# Patient Record
Sex: Male | Born: 1970
Health system: Southern US, Community
[De-identification: ages and names within clinical notes are randomized; demographics above are authoritative.]

## PROBLEM LIST (undated history)

## (undated) DIAGNOSIS — E099 Drug or chemical induced diabetes mellitus without complications: Secondary | ICD-10-CM

## (undated) DIAGNOSIS — R0609 Other forms of dyspnea: Secondary | ICD-10-CM

## (undated) DIAGNOSIS — R0602 Shortness of breath: Secondary | ICD-10-CM

## (undated) DIAGNOSIS — E8881 Metabolic syndrome: Secondary | ICD-10-CM

## (undated) DIAGNOSIS — D649 Anemia, unspecified: Secondary | ICD-10-CM

## (undated) DIAGNOSIS — I10 Essential (primary) hypertension: Secondary | ICD-10-CM

## (undated) DIAGNOSIS — F4329 Adjustment disorder with other symptoms: Secondary | ICD-10-CM

## (undated) DIAGNOSIS — K219 Gastro-esophageal reflux disease without esophagitis: Secondary | ICD-10-CM

## (undated) DIAGNOSIS — G4730 Sleep apnea, unspecified: Secondary | ICD-10-CM

## (undated) DIAGNOSIS — U099 Post covid-19 condition, unspecified: Secondary | ICD-10-CM

## (undated) DIAGNOSIS — T7840XA Allergy, unspecified, initial encounter: Secondary | ICD-10-CM

## (undated) DIAGNOSIS — J309 Allergic rhinitis, unspecified: Secondary | ICD-10-CM

## (undated) DIAGNOSIS — D696 Thrombocytopenia, unspecified: Secondary | ICD-10-CM

## (undated) DIAGNOSIS — G4733 Obstructive sleep apnea (adult) (pediatric): Secondary | ICD-10-CM

## (undated) DIAGNOSIS — R0600 Dyspnea, unspecified: Secondary | ICD-10-CM

## (undated) DIAGNOSIS — N179 Acute kidney failure, unspecified: Secondary | ICD-10-CM

## (undated) DIAGNOSIS — E871 Hypo-osmolality and hyponatremia: Secondary | ICD-10-CM

## (undated) DIAGNOSIS — D693 Immune thrombocytopenic purpura: Secondary | ICD-10-CM

## (undated) HISTORY — DX: Dyspnea, unspecified: R06.00

## (undated) HISTORY — DX: Hypo-osmolality and hyponatremia: E87.1

## (undated) HISTORY — DX: Immune thrombocytopenic purpura: D69.3

## (undated) HISTORY — DX: Allergic rhinitis, unspecified: J30.9

## (undated) HISTORY — DX: Anemia, unspecified: D64.9

## (undated) HISTORY — DX: Shortness of breath: R06.02

## (undated) HISTORY — DX: Acute kidney failure, unspecified: N17.9

## (undated) HISTORY — DX: Adverse effect of glucocorticoids and synthetic analogues, initial encounter: E09.9

## (undated) HISTORY — DX: Post covid-19 condition, unspecified: U09.9

## (undated) HISTORY — DX: Adjustment disorder with other symptoms: F43.29

## (undated) HISTORY — DX: Allergy, unspecified, initial encounter: T78.40XA

## (undated) HISTORY — DX: Essential (primary) hypertension: I10

## (undated) HISTORY — DX: Other forms of dyspnea: R06.09

## (undated) HISTORY — DX: Obstructive sleep apnea (adult) (pediatric): G47.33

## (undated) HISTORY — DX: Gastro-esophageal reflux disease without esophagitis: K21.9

## (undated) HISTORY — DX: Morbid (severe) obesity due to excess calories: E66.01

## (undated) HISTORY — DX: Metabolic syndrome: E88.81

## (undated) HISTORY — DX: Metabolic syndrome: E88.810

## (undated) HISTORY — DX: Sleep apnea, unspecified: G47.30

## (undated) HISTORY — PX: NO PAST SURGERIES: SHX2092

## (undated) HISTORY — DX: Thrombocytopenia, unspecified: D69.6

---

## 2016-01-31 DIAGNOSIS — J309 Allergic rhinitis, unspecified: Secondary | ICD-10-CM | POA: Diagnosis not present

## 2016-01-31 DIAGNOSIS — G4733 Obstructive sleep apnea (adult) (pediatric): Secondary | ICD-10-CM | POA: Diagnosis not present

## 2016-01-31 DIAGNOSIS — I10 Essential (primary) hypertension: Secondary | ICD-10-CM | POA: Diagnosis not present

## 2016-01-31 DIAGNOSIS — K219 Gastro-esophageal reflux disease without esophagitis: Secondary | ICD-10-CM | POA: Diagnosis not present

## 2016-03-27 DIAGNOSIS — Z1389 Encounter for screening for other disorder: Secondary | ICD-10-CM | POA: Diagnosis not present

## 2016-03-27 DIAGNOSIS — Z0000 Encounter for general adult medical examination without abnormal findings: Secondary | ICD-10-CM | POA: Diagnosis not present

## 2016-10-01 DIAGNOSIS — G4733 Obstructive sleep apnea (adult) (pediatric): Secondary | ICD-10-CM | POA: Diagnosis not present

## 2016-10-01 DIAGNOSIS — J309 Allergic rhinitis, unspecified: Secondary | ICD-10-CM | POA: Diagnosis not present

## 2016-10-01 DIAGNOSIS — R0982 Postnasal drip: Secondary | ICD-10-CM | POA: Diagnosis not present

## 2016-10-01 DIAGNOSIS — I10 Essential (primary) hypertension: Secondary | ICD-10-CM | POA: Diagnosis not present

## 2016-11-12 DIAGNOSIS — G4733 Obstructive sleep apnea (adult) (pediatric): Secondary | ICD-10-CM | POA: Diagnosis not present

## 2016-12-09 DIAGNOSIS — G4733 Obstructive sleep apnea (adult) (pediatric): Secondary | ICD-10-CM | POA: Diagnosis not present

## 2017-03-09 DIAGNOSIS — Z6837 Body mass index (BMI) 37.0-37.9, adult: Secondary | ICD-10-CM | POA: Diagnosis not present

## 2017-03-09 DIAGNOSIS — R590 Localized enlarged lymph nodes: Secondary | ICD-10-CM | POA: Diagnosis not present

## 2017-03-09 DIAGNOSIS — H6980 Other specified disorders of Eustachian tube, unspecified ear: Secondary | ICD-10-CM | POA: Diagnosis not present

## 2017-03-27 DIAGNOSIS — Z0000 Encounter for general adult medical examination without abnormal findings: Secondary | ICD-10-CM | POA: Diagnosis not present

## 2017-03-27 DIAGNOSIS — I10 Essential (primary) hypertension: Secondary | ICD-10-CM | POA: Diagnosis not present

## 2017-03-27 DIAGNOSIS — Z6838 Body mass index (BMI) 38.0-38.9, adult: Secondary | ICD-10-CM | POA: Diagnosis not present

## 2017-09-29 DIAGNOSIS — G4733 Obstructive sleep apnea (adult) (pediatric): Secondary | ICD-10-CM | POA: Diagnosis not present

## 2017-09-29 DIAGNOSIS — I10 Essential (primary) hypertension: Secondary | ICD-10-CM | POA: Diagnosis not present

## 2017-09-29 DIAGNOSIS — E8881 Metabolic syndrome: Secondary | ICD-10-CM | POA: Diagnosis not present

## 2017-09-29 DIAGNOSIS — K219 Gastro-esophageal reflux disease without esophagitis: Secondary | ICD-10-CM | POA: Diagnosis not present

## 2018-05-31 DIAGNOSIS — Z760 Encounter for issue of repeat prescription: Secondary | ICD-10-CM | POA: Diagnosis not present

## 2018-05-31 DIAGNOSIS — I10 Essential (primary) hypertension: Secondary | ICD-10-CM | POA: Diagnosis not present

## 2018-05-31 DIAGNOSIS — Z1339 Encounter for screening examination for other mental health and behavioral disorders: Secondary | ICD-10-CM | POA: Diagnosis not present

## 2018-05-31 DIAGNOSIS — Z13220 Encounter for screening for lipoid disorders: Secondary | ICD-10-CM | POA: Diagnosis not present

## 2018-05-31 DIAGNOSIS — Z0000 Encounter for general adult medical examination without abnormal findings: Secondary | ICD-10-CM | POA: Diagnosis not present

## 2019-02-21 DIAGNOSIS — Z131 Encounter for screening for diabetes mellitus: Secondary | ICD-10-CM | POA: Diagnosis not present

## 2019-02-21 DIAGNOSIS — Z6841 Body Mass Index (BMI) 40.0 and over, adult: Secondary | ICD-10-CM | POA: Diagnosis not present

## 2019-02-21 DIAGNOSIS — Z0000 Encounter for general adult medical examination without abnormal findings: Secondary | ICD-10-CM | POA: Diagnosis not present

## 2019-02-21 DIAGNOSIS — Z13220 Encounter for screening for lipoid disorders: Secondary | ICD-10-CM | POA: Diagnosis not present

## 2019-03-08 DIAGNOSIS — D696 Thrombocytopenia, unspecified: Secondary | ICD-10-CM | POA: Diagnosis not present

## 2019-08-03 HISTORY — DX: Other disorders of bilirubin metabolism: E80.6

## 2020-01-19 DIAGNOSIS — D696 Thrombocytopenia, unspecified: Secondary | ICD-10-CM | POA: Diagnosis not present

## 2020-02-06 DIAGNOSIS — D696 Thrombocytopenia, unspecified: Secondary | ICD-10-CM | POA: Diagnosis not present

## 2020-02-15 DIAGNOSIS — D696 Thrombocytopenia, unspecified: Secondary | ICD-10-CM | POA: Diagnosis not present

## 2020-02-28 DIAGNOSIS — D696 Thrombocytopenia, unspecified: Secondary | ICD-10-CM | POA: Diagnosis not present

## 2020-03-14 DIAGNOSIS — D696 Thrombocytopenia, unspecified: Secondary | ICD-10-CM | POA: Diagnosis not present

## 2020-03-28 DIAGNOSIS — D696 Thrombocytopenia, unspecified: Secondary | ICD-10-CM

## 2020-04-11 DIAGNOSIS — D693 Immune thrombocytopenic purpura: Secondary | ICD-10-CM

## 2020-04-25 DIAGNOSIS — D696 Thrombocytopenia, unspecified: Secondary | ICD-10-CM

## 2020-05-09 DIAGNOSIS — D696 Thrombocytopenia, unspecified: Secondary | ICD-10-CM

## 2020-05-09 DIAGNOSIS — D693 Immune thrombocytopenic purpura: Secondary | ICD-10-CM

## 2020-06-06 ENCOUNTER — Other Ambulatory Visit: Payer: Self-pay | Admitting: Hematology and Oncology

## 2020-06-06 DIAGNOSIS — D693 Immune thrombocytopenic purpura: Secondary | ICD-10-CM | POA: Diagnosis not present

## 2020-06-06 LAB — BASIC METABOLIC PANEL
BUN: 23 — AB (ref 4–21)
CO2: 28 — AB (ref 13–22)
Chloride: 99 (ref 99–108)
Creatinine: 0.8 (ref 0.6–1.3)
Glucose: 126
Potassium: 3.5 (ref 3.4–5.3)
Sodium: 139 (ref 137–147)

## 2020-06-06 LAB — HEPATIC FUNCTION PANEL
ALT: 39 (ref 10–40)
AST: 26 (ref 14–40)
Alkaline Phosphatase: 56 (ref 25–125)
Bilirubin, Total: 1

## 2020-06-06 LAB — CBC: RBC: 4.78 (ref 3.87–5.11)

## 2020-06-06 LAB — CBC AND DIFFERENTIAL
HCT: 43 (ref 41–53)
Hemoglobin: 14.4 (ref 13.5–17.5)
Neutrophils Absolute: 10
Platelets: 84 — AB (ref 150–399)
WBC: 14.7

## 2020-06-06 LAB — COMPREHENSIVE METABOLIC PANEL
Albumin: 4.3 (ref 3.5–5.0)
Calcium: 9.6 (ref 8.7–10.7)

## 2020-06-20 ENCOUNTER — Encounter: Payer: Self-pay | Admitting: Pharmacist

## 2020-06-20 ENCOUNTER — Other Ambulatory Visit: Payer: Self-pay | Admitting: Oncology

## 2020-06-20 ENCOUNTER — Other Ambulatory Visit: Payer: Self-pay | Admitting: Hematology and Oncology

## 2020-06-20 DIAGNOSIS — D693 Immune thrombocytopenic purpura: Secondary | ICD-10-CM | POA: Insufficient documentation

## 2020-06-20 LAB — CBC AND DIFFERENTIAL
HCT: 47 (ref 41–53)
Hemoglobin: 15.8 (ref 13.5–17.5)
Neutrophils Absolute: 10.5
Platelets: 84 — AB (ref 150–399)
WBC: 14

## 2020-06-20 LAB — BASIC METABOLIC PANEL
BUN: 19 (ref 4–21)
CO2: 30 — AB (ref 13–22)
Chloride: 100 (ref 99–108)
Creatinine: 0.6 (ref 0.6–1.3)
Glucose: 107
Potassium: 3.7 (ref 3.4–5.3)
Sodium: 139 (ref 137–147)

## 2020-06-20 LAB — HEPATIC FUNCTION PANEL
ALT: 41 — AB (ref 10–40)
AST: 22 (ref 14–40)
Alkaline Phosphatase: 64 (ref 25–125)
Bilirubin, Total: 0.8

## 2020-06-20 LAB — CBC: RBC: 5.23 — AB (ref 3.87–5.11)

## 2020-06-20 LAB — COMPREHENSIVE METABOLIC PANEL
Albumin: 4.3 (ref 3.5–5.0)
Calcium: 9.2 (ref 8.7–10.7)

## 2020-06-28 ENCOUNTER — Other Ambulatory Visit: Payer: Self-pay | Admitting: Hematology and Oncology

## 2020-06-28 LAB — CBC AND DIFFERENTIAL
HCT: 29 — AB (ref 41–53)
Hemoglobin: 15.2 (ref 13.5–17.5)
Neutrophils Absolute: 9.79
Platelets: 95 — AB (ref 150–399)
WBC: 15.3

## 2020-06-28 LAB — CBC: RBC: 5.2 — AB (ref 3.87–5.11)

## 2020-06-29 ENCOUNTER — Inpatient Hospital Stay: Payer: 59 | Attending: Oncology

## 2020-06-29 ENCOUNTER — Other Ambulatory Visit: Payer: Self-pay

## 2020-06-29 VITALS — BP 137/64 | HR 108 | Temp 98.2°F | Resp 18 | Ht 71.0 in | Wt 299.0 lb

## 2020-06-29 DIAGNOSIS — D693 Immune thrombocytopenic purpura: Secondary | ICD-10-CM | POA: Insufficient documentation

## 2020-06-29 DIAGNOSIS — Z5112 Encounter for antineoplastic immunotherapy: Secondary | ICD-10-CM | POA: Insufficient documentation

## 2020-06-29 MED ORDER — DIPHENHYDRAMINE HCL 50 MG/ML IJ SOLN
50.0000 mg | Freq: Once | INTRAMUSCULAR | Status: AC
  Administered 2020-06-29: 50 mg via INTRAVENOUS

## 2020-06-29 MED ORDER — ACETAMINOPHEN 325 MG PO TABS
650.0000 mg | ORAL_TABLET | Freq: Once | ORAL | Status: AC
  Administered 2020-06-29: 650 mg via ORAL

## 2020-06-29 MED ORDER — SODIUM CHLORIDE 0.9 % IV SOLN
Freq: Once | INTRAVENOUS | Status: AC
  Filled 2020-06-29: qty 250

## 2020-06-29 MED ORDER — HEPARIN SOD (PORK) LOCK FLUSH 100 UNIT/ML IV SOLN
500.0000 [IU] | Freq: Once | INTRAVENOUS | Status: DC | PRN
  Filled 2020-06-29: qty 5

## 2020-06-29 MED ORDER — SODIUM CHLORIDE 0.9% FLUSH
10.0000 mL | INTRAVENOUS | Status: DC | PRN
  Filled 2020-06-29: qty 10

## 2020-06-29 MED ORDER — SODIUM CHLORIDE 0.9 % IV SOLN
375.0000 mg/m2 | Freq: Once | INTRAVENOUS | Status: AC
  Administered 2020-06-29: 1000 mg via INTRAVENOUS
  Filled 2020-06-29: qty 100

## 2020-06-29 MED ORDER — ACETAMINOPHEN 325 MG PO TABS
ORAL_TABLET | ORAL | Status: AC
  Filled 2020-06-29: qty 2

## 2020-06-29 MED ORDER — DIPHENHYDRAMINE HCL 50 MG/ML IJ SOLN
INTRAMUSCULAR | Status: AC
  Filled 2020-06-29: qty 1

## 2020-06-29 NOTE — Progress Notes (Signed)
PT STABLE AT TIME OF DISCHARGE 

## 2020-06-29 NOTE — Patient Instructions (Signed)
Rituximab injection What is this medicine? RITUXIMAB (ri TUX i mab) is a monoclonal antibody. It is used to treat certain types of cancer like non-Hodgkin lymphoma and chronic lymphocytic leukemia. It is also used to treat rheumatoid arthritis, granulomatosis with polyangiitis (or Wegener's granulomatosis), microscopic polyangiitis, and pemphigus vulgaris. This medicine may be used for other purposes; ask your health care provider or pharmacist if you have questions. COMMON BRAND NAME(S): Rituxan, RUXIENCE What should I tell my health care provider before I take this medicine? They need to know if you have any of these conditions:  heart disease  infection (especially a virus infection such as hepatitis B, chickenpox, cold sores, or herpes)  immune system problems  irregular heartbeat  kidney disease  low blood counts, like low white cell, platelet, or red cell counts  lung or breathing disease, like asthma  recently received or scheduled to receive a vaccine  an unusual or allergic reaction to rituximab, other medicines, foods, dyes, or preservatives  pregnant or trying to get pregnant  breast-feeding How should I use this medicine? This medicine is for infusion into a vein. It is administered in a hospital or clinic by a specially trained health care professional. A special MedGuide will be given to you by the pharmacist with each prescription and refill. Be sure to read this information carefully each time. Talk to your pediatrician regarding the use of this medicine in children. This medicine is not approved for use in children. Overdosage: If you think you have taken too much of this medicine contact a poison control center or emergency room at once. NOTE: This medicine is only for you. Do not share this medicine with others. What if I miss a dose? It is important not to miss a dose. Call your doctor or health care professional if you are unable to keep an appointment. What  may interact with this medicine?  cisplatin  live virus vaccines This list may not describe all possible interactions. Give your health care provider a list of all the medicines, herbs, non-prescription drugs, or dietary supplements you use. Also tell them if you smoke, drink alcohol, or use illegal drugs. Some items may interact with your medicine. What should I watch for while using this medicine? Your condition will be monitored carefully while you are receiving this medicine. You may need blood work done while you are taking this medicine. This medicine can cause serious allergic reactions. To reduce your risk you may need to take medicine before treatment with this medicine. Take your medicine as directed. In some patients, this medicine may cause a serious brain infection that may cause death. If you have any problems seeing, thinking, speaking, walking, or standing, tell your healthcare professional right away. If you cannot reach your healthcare professional, urgently seek other source of medical care. Call your doctor or health care professional for advice if you get a fever, chills or sore throat, or other symptoms of a cold or flu. Do not treat yourself. This drug decreases your body's ability to fight infections. Try to avoid being around people who are sick. Do not become pregnant while taking this medicine or for at least 12 months after stopping it. Women should inform their doctor if they wish to become pregnant or think they might be pregnant. There is a potential for serious side effects to an unborn child. Talk to your health care professional or pharmacist for more information. Do not breast-feed an infant while taking this medicine or for at   least 6 months after stopping it. What side effects may I notice from receiving this medicine? Side effects that you should report to your doctor or health care professional as soon as possible:  allergic reactions like skin rash, itching or  hives; swelling of the face, lips, or tongue  breathing problems  chest pain  changes in vision  diarrhea  headache with fever, neck stiffness, sensitivity to light, nausea, or confusion  fast, irregular heartbeat  loss of memory  low blood counts - this medicine may decrease the number of white blood cells, red blood cells and platelets. You may be at increased risk for infections and bleeding.  mouth sores  problems with balance, talking, or walking  redness, blistering, peeling or loosening of the skin, including inside the mouth  signs of infection - fever or chills, cough, sore throat, pain or difficulty passing urine  signs and symptoms of kidney injury like trouble passing urine or change in the amount of urine  signs and symptoms of liver injury like dark yellow or brown urine; general ill feeling or flu-like symptoms; light-colored stools; loss of appetite; nausea; right upper belly pain; unusually weak or tired; yellowing of the eyes or skin  signs and symptoms of low blood pressure like dizziness; feeling faint or lightheaded, falls; unusually weak or tired  stomach pain  swelling of the ankles, feet, hands  unusual bleeding or bruising  vomiting Side effects that usually do not require medical attention (report to your doctor or health care professional if they continue or are bothersome):  headache  joint pain  muscle cramps or muscle pain  nausea  tiredness This list may not describe all possible side effects. Call your doctor for medical advice about side effects. You may report side effects to FDA at 1-800-FDA-1088. Where should I keep my medicine? This drug is given in a hospital or clinic and will not be stored at home. NOTE: This sheet is a summary. It may not cover all possible information. If you have questions about this medicine, talk to your doctor, pharmacist, or health care provider.  2020 Elsevier/Gold Standard (2018-09-29  22:01:36)  

## 2020-07-02 ENCOUNTER — Telehealth: Payer: Self-pay | Admitting: Oncology

## 2020-07-02 NOTE — Telephone Encounter (Signed)
Reschedule appt per patients request.Patient confirmed new appt date and time.

## 2020-07-04 ENCOUNTER — Inpatient Hospital Stay: Payer: 59 | Attending: Oncology

## 2020-07-04 ENCOUNTER — Other Ambulatory Visit: Payer: Self-pay

## 2020-07-04 VITALS — BP 120/77 | HR 82 | Temp 98.8°F | Resp 16 | Ht 71.0 in | Wt 290.8 lb

## 2020-07-04 DIAGNOSIS — R760 Raised antibody titer: Secondary | ICD-10-CM | POA: Insufficient documentation

## 2020-07-04 DIAGNOSIS — D693 Immune thrombocytopenic purpura: Secondary | ICD-10-CM | POA: Diagnosis not present

## 2020-07-04 DIAGNOSIS — E876 Hypokalemia: Secondary | ICD-10-CM | POA: Insufficient documentation

## 2020-07-04 DIAGNOSIS — I10 Essential (primary) hypertension: Secondary | ICD-10-CM | POA: Insufficient documentation

## 2020-07-04 DIAGNOSIS — D72828 Other elevated white blood cell count: Secondary | ICD-10-CM | POA: Insufficient documentation

## 2020-07-04 DIAGNOSIS — Z5112 Encounter for antineoplastic immunotherapy: Secondary | ICD-10-CM | POA: Diagnosis not present

## 2020-07-04 DIAGNOSIS — E099 Drug or chemical induced diabetes mellitus without complications: Secondary | ICD-10-CM | POA: Insufficient documentation

## 2020-07-04 DIAGNOSIS — Z79899 Other long term (current) drug therapy: Secondary | ICD-10-CM | POA: Insufficient documentation

## 2020-07-04 MED ORDER — ACETAMINOPHEN 325 MG PO TABS
650.0000 mg | ORAL_TABLET | Freq: Once | ORAL | Status: AC
  Administered 2020-07-04: 650 mg via ORAL

## 2020-07-04 MED ORDER — DIPHENHYDRAMINE HCL 50 MG/ML IJ SOLN
INTRAMUSCULAR | Status: AC
  Filled 2020-07-04: qty 1

## 2020-07-04 MED ORDER — ACETAMINOPHEN 325 MG PO TABS
ORAL_TABLET | ORAL | Status: AC
  Filled 2020-07-04: qty 2

## 2020-07-04 MED ORDER — SODIUM CHLORIDE 0.9 % IV SOLN
375.0000 mg/m2 | Freq: Once | INTRAVENOUS | Status: AC
  Administered 2020-07-04: 1000 mg via INTRAVENOUS
  Filled 2020-07-04: qty 100

## 2020-07-04 MED ORDER — DIPHENHYDRAMINE HCL 50 MG/ML IJ SOLN
25.0000 mg | Freq: Once | INTRAMUSCULAR | Status: AC
  Administered 2020-07-04: 25 mg via INTRAVENOUS

## 2020-07-04 MED ORDER — SODIUM CHLORIDE 0.9 % IV SOLN
Freq: Once | INTRAVENOUS | Status: AC
  Filled 2020-07-04: qty 250

## 2020-07-04 NOTE — Patient Instructions (Addendum)
[facility] [place] Discharge Instructions for Patients Receiving Chemotherapy  Today you received the following chemotherapy agents Rituximab  To help prevent nausea and vomiting after your treatment, we encourage you to take your nausea medication.   If you develop nausea and vomiting that is not controlled by your nausea medication, call the clinic.   BELOW ARE SYMPTOMS THAT SHOULD BE REPORTED IMMEDIATELY:  *FEVER GREATER THAN 100.5 F  *CHILLS WITH OR WITHOUT FEVER  NAUSEA AND VOMITING THAT IS NOT CONTROLLED WITH YOUR NAUSEA MEDICATION  *UNUSUAL SHORTNESS OF BREATH  *UNUSUAL BRUISING OR BLEEDING  TENDERNESS IN MOUTH AND THROAT WITH OR WITHOUT PRESENCE OF ULCERS  *URINARY PROBLEMS  *BOWEL PROBLEMS  UNUSUAL RASH Items with * indicate a potential emergency and should be followed up as soon as possible.  Feel free to call the clinic should you have any questions or concerns at The clinic phone number is [phone].  Please show the CHEMO ALERT CARD at check-in to the Emergency Department and triage nurse.  Rituximab injection What is this medicine? RITUXIMAB (ri TUX i mab) is a monoclonal antibody. It is used to treat certain types of cancer like non-Hodgkin lymphoma and chronic lymphocytic leukemia. It is also used to treat rheumatoid arthritis, granulomatosis with polyangiitis (or Wegener's granulomatosis), microscopic polyangiitis, and pemphigus vulgaris. This medicine may be used for other purposes; ask your health care provider or pharmacist if you have questions. COMMON BRAND NAME(S): Rituxan, RUXIENCE What should I tell my health care provider before I take this medicine? They need to know if you have any of these conditions:  heart disease  infection (especially a virus infection such as hepatitis B, chickenpox, cold sores, or herpes)  immune system problems  irregular heartbeat  kidney disease  low blood counts, like low white cell,  platelet, or red cell counts  lung or breathing disease, like asthma  recently received or scheduled to receive a vaccine  an unusual or allergic reaction to rituximab, other medicines, foods, dyes, or preservatives  pregnant or trying to get pregnant  breast-feeding How should I use this medicine? This medicine is for infusion into a vein. It is administered in a hospital or clinic by a specially trained health care professional. A special MedGuide will be given to you by the pharmacist with each prescription and refill. Be sure to read this information carefully each time. Talk to your pediatrician regarding the use of this medicine in children. This medicine is not approved for use in children. Overdosage: If you think you have taken too much of this medicine contact a poison control center or emergency room at once. NOTE: This medicine is only for you. Do not share this medicine with others. What if I miss a dose? It is important not to miss a dose. Call your doctor or health care professional if you are unable to keep an appointment. What may interact with this medicine?  cisplatin  live virus vaccines This list may not describe all possible interactions. Give your health care provider a list of all the medicines, herbs, non-prescription drugs, or dietary supplements you use. Also tell them if you smoke, drink alcohol, or use illegal drugs. Some items may interact with your medicine. What should I watch for while using this medicine? Your condition will be monitored carefully while you are receiving this medicine. You may need blood work done while you are taking this medicine. This medicine can cause serious allergic reactions. To reduce your risk you  may need to take medicine before treatment with this medicine. Take your medicine as directed. In some patients, this medicine may cause a serious brain infection that may cause death. If you have any problems seeing, thinking,  speaking, walking, or standing, tell your healthcare professional right away. If you cannot reach your healthcare professional, urgently seek other source of medical care. Call your doctor or health care professional for advice if you get a fever, chills or sore throat, or other symptoms of a cold or flu. Do not treat yourself. This drug decreases your body's ability to fight infections. Try to avoid being around people who are sick. Do not become pregnant while taking this medicine or for at least 12 months after stopping it. Women should inform their doctor if they wish to become pregnant or think they might be pregnant. There is a potential for serious side effects to an unborn child. Talk to your health care professional or pharmacist for more information. Do not breast-feed an infant while taking this medicine or for at least 6 months after stopping it. What side effects may I notice from receiving this medicine? Side effects that you should report to your doctor or health care professional as soon as possible:  allergic reactions like skin rash, itching or hives; swelling of the face, lips, or tongue  breathing problems  chest pain  changes in vision  diarrhea  headache with fever, neck stiffness, sensitivity to light, nausea, or confusion  fast, irregular heartbeat  loss of memory  low blood counts - this medicine may decrease the number of white blood cells, red blood cells and platelets. You may be at increased risk for infections and bleeding.  mouth sores  problems with balance, talking, or walking  redness, blistering, peeling or loosening of the skin, including inside the mouth  signs of infection - fever or chills, cough, sore throat, pain or difficulty passing urine  signs and symptoms of kidney injury like trouble passing urine or change in the amount of urine  signs and symptoms of liver injury like dark yellow or brown urine; general ill feeling or flu-like  symptoms; light-colored stools; loss of appetite; nausea; right upper belly pain; unusually weak or tired; yellowing of the eyes or skin  signs and symptoms of low blood pressure like dizziness; feeling faint or lightheaded, falls; unusually weak or tired  stomach pain  swelling of the ankles, feet, hands  unusual bleeding or bruising  vomiting Side effects that usually do not require medical attention (report to your doctor or health care professional if they continue or are bothersome):  headache  joint pain  muscle cramps or muscle pain  nausea  tiredness This list may not describe all possible side effects. Call your doctor for medical advice about side effects. You may report side effects to FDA at 1-800-FDA-1088. Where should I keep my medicine? This drug is given in a hospital or clinic and will not be stored at home. NOTE: This sheet is a summary. It may not cover all possible information. If you have questions about this medicine, talk to your doctor, pharmacist, or health care provider.  2020 Elsevier/Gold Standard (2018-09-29 22:01:36)

## 2020-07-04 NOTE — Progress Notes (Signed)
Pt stable at time of discharge. 

## 2020-07-05 ENCOUNTER — Telehealth: Payer: Self-pay

## 2020-07-05 NOTE — Telephone Encounter (Signed)
Pts sister,Connie called in. Pt concerned as his blood sugar has been elevated since treatment yesterday. 2 readings today  143, & 175. They are asking if they should be concerned? [phone]

## 2020-07-05 NOTE — Telephone Encounter (Signed)
I spoke with Mike [name], pt's sister. I gave her your replied message. She states that they looked up Rituxan on google and it said that high blood sugar is a side effect. She states he hasn't changed any of his eating habits. They don't understand why his blood sugar is up all of a sudden (because he has been on the same steroid and BS meds daily).  I told her you encouraged her to f/u with PCP, so PCP can address the blood sugars w/current medications. She asked that I send this back to you. [phone]

## 2020-07-05 NOTE — Telephone Encounter (Signed)
It's not a usual side effect of Rituxan and I don't believe he received decadron. I see he takes oral meds for diabetes. I would have him contact his PCP for management and instructions.

## 2020-07-06 NOTE — Telephone Encounter (Signed)
Ilda Basset.,NP, states she still advises that pt see PCP for recommendations. I then called [name], pt's sister, and notified her of Melissa's recommendation. [name] thanked me for returning her call. She states Mike [name]'s blood sugar is back down to 124 this morning. We discussed that it may be possible that his blood sugar may be increased a day or 2 after Rituxan, then return to normal. They will notify PCP if blood sugars start elevating again.

## 2020-07-09 ENCOUNTER — Other Ambulatory Visit: Payer: Self-pay | Admitting: Oncology

## 2020-07-09 DIAGNOSIS — D693 Immune thrombocytopenic purpura: Secondary | ICD-10-CM

## 2020-07-11 ENCOUNTER — Other Ambulatory Visit: Payer: Self-pay | Admitting: Oncology

## 2020-07-11 DIAGNOSIS — D693 Immune thrombocytopenic purpura: Secondary | ICD-10-CM

## 2020-07-11 NOTE — Progress Notes (Signed)
[facility]  [address] [place],  [place]  [address] [phone]  Clinic Day:  07/11/2020  Referring physician: Dellia Beckwith, MD   This document serves as a record of services personally performed by Gery Pray, MD. It was created on their behalf by Curry,Lauren E, a trained medical scribe. The creation of this record is based on the scribe's personal observations and the provider's statements to them.   CHIEF COMPLAINT:  CC: Thrombocytopenia  Current Treatment:  Rituximab   HISTORY OF PRESENT ILLNESS:  Mike [name] is a 49 y.o. male with severe thrombocytopenia, felt to represent immune thrombocytopenic purpura.  He has had thrombocytopenia which has fluctuated up and down since June 2020.  Further laboratory evaluation did not reveal any other specific etiology for his thrombocytopenia.  He had mildly elevated liver transaminases.  Abdominal ultrasound revealed increased echogenicity of hepatic parenchyma suggesting hepatic steatosis, with probable fatty sparing adjacent to gallbladder fossa.  No other abnormality was seen.  We recommended bone marrow examination, but the patient and his mother declined.  The platelet count continued to fluctuate up and down, but as his platelet count remained 25,000 on June 7th, he was started on prednisone 20 mg 3 times daily.  His platelet count had increased to 73,000 on June 16th, so prednisone was continued at the same dose.  He had leukocytosis secondary to the high-dose prednisone.  He has had oral candidiasis on occasion.  His dose of prednisone was decreased from 60 mg to 50 mg daily in late August.  Malachi Carl titers were elevated, so this may be representative of chronic fatigue syndrome. We decreased his prednisone to 45 mg in late September and platelets remained stable at 97,000.   INTERVAL HISTORY:  Mike [name] is here for follow up after starting rituxamab on October 20th.  He has  tolerated the first dose without difficulty other than an episode of dizziness and headache.  Both of these issues resolved and with his 2nd dose he did not experience any side effects.  He is due to start a 3rd cycle on November 12th.  He denies complaints today other than anxiety.  His platelet count has increased from 95,000 to 124,000, his white count has decreased from 15.0 to 14.0, and his hemoglobin is normal.  We will plan to continue prednisone taper, so he will decrease his dose to 40 mg daily.  Chemistries are unremarkable except for a BUN of 25.  Potassium is borderline normal at 3.5, and he continues oral supplement 20 meq TID.  His  appetite is good, and he has gained 11 pounds since his last visit.  He denies fever, chills or other signs of infection.  He denies nausea, vomiting, bowel issues, or abdominal pain.  He denies sore throat, cough, dyspnea, or chest pain.   REVIEW OF SYSTEMS:  Review of Systems  Psychiatric/Behavioral: The patient is nervous/anxious.   All other systems reviewed and are negative.    VITALS:  There were no vitals taken for this visit.  Wt Readings from Last 3 Encounters:  07/04/20 290 lb 12 oz (131.9 kg)  07/03/20 (!) 301 lb 6.4 oz (136.7 kg)  06/29/20 299 lb (135.6 kg)    There is no height or weight on file to calculate BMI.  Performance status (ECOG): 0 - Asymptomatic  PHYSICAL EXAM:  Physical Exam Constitutional:      General: He is not in acute distress.    Appearance: Normal appearance. He  is normal weight.  HENT:     Head: Normocephalic and atraumatic.  Eyes:     General: No scleral icterus.    Extraocular Movements: Extraocular movements intact.     Conjunctiva/sclera: Conjunctivae normal.     Pupils: Pupils are equal, round, and reactive to light.  Cardiovascular:     Rate and Rhythm: Normal rate and regular rhythm.     Pulses: Normal pulses.     Heart sounds: Normal heart sounds. No murmur heard.  No friction rub. No gallop.    Pulmonary:     Effort: Pulmonary effort is normal. No respiratory distress.     Breath sounds: Normal breath sounds.  Abdominal:     General: Bowel sounds are normal. There is no distension.     Palpations: Abdomen is soft. There is no mass.     Tenderness: There is no abdominal tenderness.  Musculoskeletal:        General: Normal range of motion.     Cervical back: Neck supple.     Right lower leg: Edema (trace) present.     Left lower leg: Edema (trace) present.  Lymphadenopathy:     Cervical: No cervical adenopathy.  Skin:    General: Skin is warm and dry.  Neurological:     General: No focal deficit present.     Mental Status: He is alert and oriented to person, place, and time. Mental status is at baseline.  Psychiatric:        Mood and Affect: Mood normal.        Behavior: Behavior normal.        Thought Content: Thought content normal.        Judgment: Judgment normal.     LABS:   His platelet count has increased from 95,000 to 124,000, his white count has decreased from 15.0 to 14.0, and his hemoglobin is normal.  Chemistries are unremarkable except for a BUN of 25.   CBC Latest Ref Rng & Units 06/28/2020 06/20/2020 06/06/2020  WBC - 15.3 14.0 14.7  Hemoglobin 13.5 - 17.5 15.2 15.8 14.4  Hematocrit 41 - 53 29(A) 47 43  Platelets 150 - 399 95(A) 84(A) 84(A)   CMP Latest Ref Rng & Units 06/20/2020 06/06/2020  BUN 4 - 21 19 23(A)  Creatinine 0.6 - 1.3 0.6 0.8  Sodium 137 - 147 139 139  Potassium 3.4 - 5.3 3.7 3.5  Chloride 99 - 108 100 99  CO2 13 - 22 30(A) 28(A)  Calcium 8.7 - 10.7 9.2 9.6  Alkaline Phos 25 - 125 64 56  AST 14 - 40 22 26  ALT 10 - 40 41(A) 39     STUDIES:  No results found.   Allergies: No Known Allergies  Current Medications: Current Outpatient Medications  Medication Sig Dispense Refill  . amLODipine (NORVASC) 5 MG tablet Take 5 mg by mouth daily.    . Ascorbic Acid (VITAMIN C) 1000 MG tablet Take 1,000 mg by mouth daily.    .  benazepril (LOTENSIN) 40 MG tablet Take 40 mg by mouth daily.    . cholecalciferol (VITAMIN D3) 25 MCG (1000 UNIT) tablet Take 1,000 Units by mouth daily.    Marland Kitchen esterified estrogens (MENEST) 0.625 MG tablet Take 0.625 mg by mouth daily. (Patient not taking: Reported on 06/29/2020)    . fexofenadine (ALLEGRA) 180 MG tablet Take 180 mg by mouth daily.    . fluconazole (DIFLUCAN) 200 MG tablet Take 200 mg by mouth daily.    Marland Kitchen  glipiZIDE (GLUCOTROL) 10 MG tablet Take 10 mg by mouth daily before breakfast.    . indapamide (LOZOL) 2.5 MG tablet Take 2.5 mg by mouth daily.    . metFORMIN (GLUCOPHAGE) 500 MG tablet Take 2,000 mg by mouth Nightly.    Marland Kitchen. omeprazole (PRILOSEC) 20 MG capsule Take 20 mg by mouth 4 (four) times daily.    . potassium chloride (KLOR-CON) 10 MEQ tablet Take 30 mEq by mouth daily.    . predniSONE (DELTASONE) 20 MG tablet Take 45 mg by mouth daily with breakfast.    . sildenafil (VIAGRA) 100 MG tablet Take 100 mg by mouth daily as needed for erectile dysfunction.    . vitamin B-12 (CYANOCOBALAMIN) 1000 MCG tablet Take 1,000 mcg by mouth daily.    . vitamin k 100 MCG tablet Take 100 mcg by mouth daily.     No current facility-administered medications for this visit.     ASSESSMENT & PLAN:   Assessment:  1. Significant thrombocytopenia, which has been fluctuating up and down since June 2020.  This is felt to represent immune thrombocytopenic purpura.  We placed him on prednisone 20 mg three times daily in early June.  His platelet count initially significantly increased to 73,000 on June 16th, but has fluctuated up and down since that time.  He did go as high as 101,000, and we tapered his dose to 45 mg daily.  However, it is disappointing that his platelet count is back down again as he has been on steroids now for 4 months.  We started rituximab therapy and will plan for 4 doses.  He has tolerated this without significant difficulty.  2. Leukocytosis, secondary to high dose  steroids.  He does not have symptoms of infection.    3. Hypertension, secondary to high-dose prednisone.  He is currently on three different medications with better control.  He will continue to monitor this at home.      4. Hypokalemia, borderline normal.  He continues oral supplement 20 meq TID.    5. Steroid induced diabetes.  He has been placed on metformin 500 mg twice daily and glipizide 5 mg daily.  6.  Elevated Epstein Barr titers.  This may be indicative of chronic fatigue syndrome, and I advised that he rest as needed.  We had a discussion about this diagnosis.  His sister plans to start him on zinc, lemon balm, and L lysine.  Plan: He started treatment with weekly IV rituximab on October 20th, and has tolerated this without significant difficulty.  His platelet count has already shown good improvement, and so we will continue with the planned 4 doses.  We will continue prednisone taper, so he will decrease the dose to 40 mg daily.  He will proceed with a 3rd cycle tomorrow and continue with weekly labs.  We will see him back on December 1st with CBC and CMP to assess his final response to therapy.  The patient understands the plans discussed today and are in agreement with them.  They know to contact our office if he develops symptoms of infection or other concerns prior to his next appointment.    I provided 30 minutes of face-to-face time during this this encounter and > 50% was spent counseling as documented under my assessment and plan.    Dellia Beckwithhristine H Rosemae Mcquown, MD [facility] [address] [place] [id number] Dept: [phone] Dept Fax: [phone]   I, Foye DeerLauren Curry, am  acting as scribe for Dellia Beckwith, MD  I have reviewed this report as typed by the medical scribe, and it is complete and accurate.

## 2020-07-12 ENCOUNTER — Encounter: Payer: Self-pay | Admitting: Oncology

## 2020-07-12 ENCOUNTER — Inpatient Hospital Stay: Payer: 59

## 2020-07-12 ENCOUNTER — Other Ambulatory Visit: Payer: Self-pay

## 2020-07-12 ENCOUNTER — Other Ambulatory Visit: Payer: Self-pay | Admitting: Oncology

## 2020-07-12 ENCOUNTER — Other Ambulatory Visit: Payer: Self-pay | Admitting: Hematology and Oncology

## 2020-07-12 ENCOUNTER — Inpatient Hospital Stay (INDEPENDENT_AMBULATORY_CARE_PROVIDER_SITE_OTHER): Payer: 59 | Admitting: Oncology

## 2020-07-12 VITALS — BP 166/86 | HR 101 | Temp 97.8°F | Resp 18 | Ht 71.0 in | Wt 301.0 lb

## 2020-07-12 DIAGNOSIS — D693 Immune thrombocytopenic purpura: Secondary | ICD-10-CM

## 2020-07-12 LAB — CBC: RBC: 5.11 (ref 3.87–5.11)

## 2020-07-12 LAB — CBC AND DIFFERENTIAL
HCT: 45 (ref 41–53)
Hemoglobin: 14.9 (ref 13.5–17.5)
Neutrophils Absolute: 10.08
Platelets: 124 — AB (ref 150–399)
WBC: 14

## 2020-07-12 LAB — BASIC METABOLIC PANEL
BUN: 25 — AB (ref 4–21)
CO2: 30 — AB (ref 13–22)
Chloride: 96 — AB (ref 99–108)
Creatinine: 0.7 (ref 0.6–1.3)
Glucose: 112
Potassium: 3.5 (ref 3.4–5.3)
Sodium: 138 (ref 137–147)

## 2020-07-12 LAB — HEPATIC FUNCTION PANEL
ALT: 33 (ref 10–40)
AST: 24 (ref 14–40)
Alkaline Phosphatase: 53 (ref 25–125)
Bilirubin, Total: 0.9

## 2020-07-12 LAB — COMPREHENSIVE METABOLIC PANEL
Albumin: 4.2 (ref 3.5–5.0)
Calcium: 9.3 (ref 8.7–10.7)

## 2020-07-12 NOTE — Telephone Encounter (Signed)
Error. [facility] 

## 2020-07-13 ENCOUNTER — Inpatient Hospital Stay: Payer: 59

## 2020-07-13 VITALS — BP 136/81 | HR 79 | Temp 98.4°F | Resp 18 | Ht 71.0 in | Wt 300.2 lb

## 2020-07-13 DIAGNOSIS — D693 Immune thrombocytopenic purpura: Secondary | ICD-10-CM

## 2020-07-13 MED ORDER — ACETAMINOPHEN 325 MG PO TABS
650.0000 mg | ORAL_TABLET | Freq: Once | ORAL | Status: AC
  Administered 2020-07-13: 650 mg via ORAL

## 2020-07-13 MED ORDER — SODIUM CHLORIDE 0.9 % IV SOLN
375.0000 mg/m2 | Freq: Once | INTRAVENOUS | Status: AC
  Administered 2020-07-13: 1000 mg via INTRAVENOUS
  Filled 2020-07-13: qty 100

## 2020-07-13 MED ORDER — SODIUM CHLORIDE 0.9 % IV SOLN
Freq: Once | INTRAVENOUS | Status: AC
  Filled 2020-07-13: qty 250

## 2020-07-13 MED ORDER — DIPHENHYDRAMINE HCL 50 MG/ML IJ SOLN
INTRAMUSCULAR | Status: AC
  Filled 2020-07-13: qty 1

## 2020-07-13 MED ORDER — ACETAMINOPHEN 325 MG PO TABS
ORAL_TABLET | ORAL | Status: AC
  Filled 2020-07-13: qty 2

## 2020-07-13 MED ORDER — DIPHENHYDRAMINE HCL 50 MG/ML IJ SOLN
25.0000 mg | Freq: Once | INTRAMUSCULAR | Status: AC
  Administered 2020-07-13: 25 mg via INTRAVENOUS

## 2020-07-13 NOTE — Progress Notes (Signed)
Pt stable at time of discharge (1525).

## 2020-07-13 NOTE — Patient Instructions (Signed)
[facility] [place] Discharge Instructions for Patients Receiving Chemotherapy  Today you received the following chemotherapy agents Rituximab.  To help prevent nausea and vomiting after your treatment, we encourage you to take your nausea medication.   If you develop nausea and vomiting that is not controlled by your nausea medication, call the clinic.   BELOW ARE SYMPTOMS THAT SHOULD BE REPORTED IMMEDIATELY:  *FEVER GREATER THAN 100.5 F  *CHILLS WITH OR WITHOUT FEVER  NAUSEA AND VOMITING THAT IS NOT CONTROLLED WITH YOUR NAUSEA MEDICATION  *UNUSUAL SHORTNESS OF BREATH  *UNUSUAL BRUISING OR BLEEDING  TENDERNESS IN MOUTH AND THROAT WITH OR WITHOUT PRESENCE OF ULCERS  *URINARY PROBLEMS  *BOWEL PROBLEMS  UNUSUAL RASH Items with * indicate a potential emergency and should be followed up as soon as possible.  Feel free to call the clinic should you have any questions or concerns at The clinic phone number is [phone].  Please show the CHEMO ALERT CARD at check-in to the Emergency Department and triage nurse.   

## 2020-07-14 ENCOUNTER — Telehealth: Payer: Self-pay | Admitting: Oncology

## 2020-07-14 NOTE — Telephone Encounter (Signed)
Per 11/11 LOS - Scheduled Patient's 11/22 Lab Appt.  Requested patient to be given this Appt at next Appt

## 2020-07-17 ENCOUNTER — Ambulatory Visit: Payer: 59

## 2020-07-18 ENCOUNTER — Ambulatory Visit: Payer: 59

## 2020-07-18 ENCOUNTER — Inpatient Hospital Stay: Payer: 59

## 2020-07-18 ENCOUNTER — Other Ambulatory Visit: Payer: Self-pay

## 2020-07-18 VITALS — BP 127/73 | HR 79 | Temp 98.8°F | Resp 16 | Ht 71.0 in | Wt 297.8 lb

## 2020-07-18 DIAGNOSIS — D693 Immune thrombocytopenic purpura: Secondary | ICD-10-CM | POA: Diagnosis not present

## 2020-07-18 MED ORDER — ACETAMINOPHEN 325 MG PO TABS
ORAL_TABLET | ORAL | Status: AC
  Filled 2020-07-18: qty 2

## 2020-07-18 MED ORDER — SODIUM CHLORIDE 0.9 % IV SOLN
Freq: Once | INTRAVENOUS | Status: AC
  Filled 2020-07-18: qty 250

## 2020-07-18 MED ORDER — DIPHENHYDRAMINE HCL 50 MG/ML IJ SOLN
INTRAMUSCULAR | Status: AC
  Filled 2020-07-18: qty 1

## 2020-07-18 MED ORDER — SODIUM CHLORIDE 0.9 % IV SOLN
375.0000 mg/m2 | Freq: Once | INTRAVENOUS | Status: AC
  Administered 2020-07-18: 1000 mg via INTRAVENOUS
  Filled 2020-07-18: qty 100

## 2020-07-18 MED ORDER — DIPHENHYDRAMINE HCL 50 MG/ML IJ SOLN
25.0000 mg | Freq: Once | INTRAMUSCULAR | Status: AC
  Administered 2020-07-18: 25 mg via INTRAVENOUS

## 2020-07-18 MED ORDER — ACETAMINOPHEN 325 MG PO TABS
650.0000 mg | ORAL_TABLET | Freq: Once | ORAL | Status: AC
  Administered 2020-07-18: 650 mg via ORAL

## 2020-07-18 NOTE — Patient Instructions (Signed)
[facility] [place] Discharge Instructions for Patients Receiving Chemotherapy  Today you received the following chemotherapy agents Rituximab.  To help prevent nausea and vomiting after your treatment, we encourage you to take your nausea medication.   If you develop nausea and vomiting that is not controlled by your nausea medication, call the clinic.   BELOW ARE SYMPTOMS THAT SHOULD BE REPORTED IMMEDIATELY:  *FEVER GREATER THAN 100.5 F  *CHILLS WITH OR WITHOUT FEVER  NAUSEA AND VOMITING THAT IS NOT CONTROLLED WITH YOUR NAUSEA MEDICATION  *UNUSUAL SHORTNESS OF BREATH  *UNUSUAL BRUISING OR BLEEDING  TENDERNESS IN MOUTH AND THROAT WITH OR WITHOUT PRESENCE OF ULCERS  *URINARY PROBLEMS  *BOWEL PROBLEMS  UNUSUAL RASH Items with * indicate a potential emergency and should be followed up as soon as possible.  Feel free to call the clinic should you have any questions or concerns at The clinic phone number is [phone].  Please show the CHEMO ALERT CARD at check-in to the Emergency Department and triage nurse.   

## 2020-07-18 NOTE — Progress Notes (Signed)
Pt stable at time of discharge. 

## 2020-07-23 ENCOUNTER — Other Ambulatory Visit: Payer: Self-pay

## 2020-07-23 ENCOUNTER — Inpatient Hospital Stay: Payer: 59 | Admitting: Hematology and Oncology

## 2020-07-23 DIAGNOSIS — D693 Immune thrombocytopenic purpura: Secondary | ICD-10-CM

## 2020-07-23 LAB — CBC AND DIFFERENTIAL
HCT: 44 (ref 41–53)
Hemoglobin: 14.8 (ref 13.5–17.5)
Neutrophils Absolute: 10.73
Platelets: 98 — AB (ref 150–399)
WBC: 14.3

## 2020-07-23 LAB — BASIC METABOLIC PANEL
BUN: 19 (ref 4–21)
CO2: 33 — AB (ref 13–22)
Chloride: 98 — AB (ref 99–108)
Creatinine: 0.8 (ref 0.6–1.3)
Glucose: 141
Potassium: 4.1 (ref 3.4–5.3)
Sodium: 138 (ref 137–147)

## 2020-07-23 LAB — HEPATIC FUNCTION PANEL
ALT: 35 (ref 10–40)
AST: 23 (ref 14–40)
Alkaline Phosphatase: 57 (ref 25–125)
Bilirubin, Total: 0.8

## 2020-07-23 LAB — COMPREHENSIVE METABOLIC PANEL
Albumin: 4.3 (ref 3.5–5.0)
Calcium: 9.5 (ref 8.7–10.7)

## 2020-07-23 LAB — CBC: RBC: 5.01 (ref 3.87–5.11)

## 2020-07-30 NOTE — Progress Notes (Signed)
[facility]  [address] [place],  [place]  [address] [phone]  Clinic Day:  08/01/2020  Referring physician: [name], NP   This document serves as a record of services personally performed by Mike Poisson, MD. It was created on their behalf by Mike [name], a trained medical scribe. The creation of this record is based on the scribe's personal observations and the provider's statements to them.   CHIEF COMPLAINT:  CC: Thrombocytopenia  Current Treatment:  Prednisone 35 mg daily   HISTORY OF PRESENT ILLNESS:  Mike [name] is a 49 y.o. male with severe thrombocytopenia, felt to represent immune thrombocytopenic purpura.  He has had thrombocytopenia which has fluctuated up and down since June 2020.  Further laboratory evaluation did not reveal any other specific etiology for his thrombocytopenia.  He had mildly elevated liver transaminases.  Abdominal ultrasound revealed increased echogenicity of hepatic parenchyma suggesting hepatic steatosis, with probable fatty sparing adjacent to gallbladder fossa.  No other abnormality was seen.  We recommended bone marrow examination, but [name] and his mother declined.  The platelet count continued to fluctuate up and down, but as his platelet count remained 25,000 on June 7th, he was started on prednisone 20 mg 3 times daily.  His platelet count had increased to 73,000 on June 16th, so prednisone was continued at the same dose.  He had leukocytosis secondary to the high-dose prednisone.  He has had oral candidiasis on occasion.  His dose of prednisone was decreased from 60 mg to 50 mg daily in late August.  Mike [name] titers were elevated, so this may be representative of chronic fatigue syndrome. We decreased his prednisone to 45 mg in late September and platelets remained stable at 97,000.  We were unable to taper him off the prednisone and so administered Rituximab weekly for 4 weeks in  October.  We have now resumed a taper of his steroids.   INTERVAL HISTORY:  He is here for follow up after completing 4 cycles of Rituximab on November 17th.  He continues prednisone 40 mg daily, and we will continue to taper to 35 mg daily.  I will send in new prescription for the 10 mg prednisone.  He states that he has been well and denies complaints.  His platelet count has increased from 98,000 to 112,000, his white count has mildly increased from 14.3 to 15.9, and his hemoglobin is normal.  Chemistries are unremarkable.  His  appetite is good, and his weight is stable since his last visit.  He denies fever, chills or other signs of infection.  He denies nausea, vomiting, bowel issues, or abdominal pain.  He denies sore throat, cough, dyspnea, or chest pain.   REVIEW OF SYSTEMS:  Review of Systems  Constitutional: Negative.   HENT:  Negative.   Eyes: Negative.   Respiratory: Negative.   Cardiovascular: Negative.   Gastrointestinal: Negative.   Endocrine: Negative.   Genitourinary: Negative.    Musculoskeletal: Negative.   Skin: Negative.   Neurological: Negative.   Hematological: Negative.   Psychiatric/Behavioral: Negative.   All other systems reviewed and are negative.    VITALS:  Blood pressure (!) 170/88, pulse 99, temperature 98.2 F (36.8 C), temperature source Oral, resp. rate 18, height _0  (1.803 m), weight (!) 301 lb 6.4 oz (136.7 kg), SpO2 96 %.  Wt Readings from Last 3 Encounters:  08/01/20 (!) 301 lb 6.4 oz (136.7 kg)  07/18/20 297 lb 12 oz (135.1  kg)  07/13/20 (!) 300 lb 4 oz (136.2 kg)    Body mass index is 42.04 kg/m.  Performance status (ECOG): 0 - Asymptomatic  PHYSICAL EXAM:  Physical Exam Constitutional:      General: He is not in acute distress.    Appearance: Normal appearance. He is normal weight.  HENT:     Head: Normocephalic and atraumatic.  Eyes:     General: No scleral icterus.    Extraocular Movements: Extraocular movements intact.      Conjunctiva/sclera: Conjunctivae normal.     Pupils: Pupils are equal, round, and reactive to light.  Cardiovascular:     Rate and Rhythm: Normal rate and regular rhythm.     Pulses: Normal pulses.     Heart sounds: Normal heart sounds. No murmur heard.  No friction rub. No gallop.   Pulmonary:     Effort: Pulmonary effort is normal. No respiratory distress.     Breath sounds: Normal breath sounds.  Abdominal:     General: Bowel sounds are normal. There is no distension.     Palpations: Abdomen is soft. There is no mass.     Tenderness: There is no abdominal tenderness.  Musculoskeletal:        General: Normal range of motion.     Cervical back: Normal range of motion and neck supple.     Right lower leg: No edema.     Left lower leg: No edema.  Lymphadenopathy:     Cervical: No cervical adenopathy.  Skin:    General: Skin is warm and dry.  Neurological:     General: No focal deficit present.     Mental Status: He is alert and oriented to person, place, and time. Mental status is at baseline.  Psychiatric:        Mood and Affect: Mood normal.        Behavior: Behavior normal.        Thought Content: Thought content normal.        Judgment: Judgment normal.     LABS:   CBC Latest Ref Rng & Units 08/01/2020 07/23/2020 07/12/2020  WBC - 15.9 14.3 14.0  Hemoglobin 13.5 - 17.5 15.2 14.8 14.9  Hematocrit 41 - 53 45 44 45  Platelets 150 - 399 112(A) 98(A) 124(A)   CMP Latest Ref Rng & Units 08/01/2020 07/23/2020 07/12/2020  BUN 4 - _0 25(A)  Creatinine 0.6 - 1.3 0.7 0.8 0.7  Sodium 137 - 147 138 138 138  Potassium 3.4 - 5.3 3.7 4.1 3.5  Chloride 99 - 108 98(A) 98(A) 96(A)  CO2 13 - 22 29(A) 33(A) 30(A)  Calcium 8.7 - 10.7 9.7 9.5 9.3  Alkaline Phos 25 - 125 66 57 53  AST 14 - 40 _1 ALT 10 - 40 34 35 33     STUDIES:  No results found.   Allergies: No Known Allergies  Current Medications: Current Outpatient Medications  Medication Sig Dispense  Refill  . Accu-Chek FastClix Lancets MISC Apply topically daily.    Marland Kitchen ACCU-CHEK GUIDE test strip USE TO check blood sugar UP TO THREE TIMES DAILY    . amLODipine (NORVASC) 5 MG tablet Take 5 mg by mouth daily.    . Ascorbic Acid (VITAMIN C) 1000 MG tablet Take 1,000 mg by mouth daily.    . benazepril (LOTENSIN) 40 MG tablet Take 40 mg by mouth daily.    . Blood Glucose Monitoring Suppl (ACCU-CHEK GUIDE) w/Device KIT See  admin instructions.    . cholecalciferol (VITAMIN D3) 25 MCG (1000 UNIT) tablet Take 1,000 Units by mouth daily.    Marland Kitchen escitalopram (LEXAPRO) 5 MG tablet Take 5 mg by mouth daily.    . fexofenadine (ALLEGRA) 180 MG tablet Take 180 mg by mouth daily.    . fluconazole (DIFLUCAN) 200 MG tablet Take 200 mg by mouth daily.    Marland Kitchen glipiZIDE (GLUCOTROL XL) 10 MG 24 hr tablet Take 10 mg by mouth daily.    . indapamide (LOZOL) 2.5 MG tablet Take 2.5 mg by mouth daily.    . metFORMIN (GLUCOPHAGE) 500 MG tablet Take 2,000 mg by mouth Nightly.    Marland Kitchen omeprazole (PRILOSEC) 20 MG capsule Take 20 mg by mouth 4 (four) times daily.    . potassium chloride SA (KLOR-CON) 20 MEQ tablet Take 20 mEq by mouth 3 (three) times daily.    . predniSONE (DELTASONE) 20 MG tablet Take 40 mg by mouth daily with breakfast.     . vitamin B-12 (CYANOCOBALAMIN) 1000 MCG tablet Take 1,000 mcg by mouth daily.    . vitamin k 100 MCG tablet Take 100 mcg by mouth daily.     No current facility-administered medications for this visit.     ASSESSMENT & PLAN:   Assessment:  1. Significant thrombocytopenia, which has been fluctuating up and down since June 2020.  This is felt to represent immune thrombocytopenic purpura.  We placed him on prednisone 20 mg three times daily in early June.  His platelet count initially significantly increased to 73,000 on June 16th, but has fluctuated up and down since that time.  We have been unable to taper his steroids despite efforts for the last 3-4 months.  We started rituximab therapy  in October and he completed 4 doses.  His platelet count is now above 100,000.  2. Leukocytosis, secondary to high dose steroids.  He does not have symptoms of infection.    3. Hypertension, secondary to high-dose prednisone.  He is currently on three different medications with better control.  He will continue to monitor this at home.      4. Hypokalemia, borderline normal.  He continues oral supplement 20 meq TID.    5. Steroid induced diabetes.  He has been placed on metformin 500 mg twice daily and glipizide 5 mg daily.  6.  Elevated Epstein Barr titers.  This may be indicative of chronic fatigue syndrome, and I advised that he rest as needed.  We had a discussion about this diagnosis.  His sister has started him on zinc, lemon balm, and L lysine.  Plan: He started treatment with weekly IV rituximab on October 20th, and completed 4 doses in mid November.  We will continue prednisone taper, so he will decrease the dose to 35 mg daily.  He will continue with weekly labs.  We will see him back on December 15th with CBC and CMP for repeat evaluation.  [name] understands the plans discussed today and are in agreement with them.  They know to contact our office if he develops symptoms of infection or other concerns prior to his next appointment.    I provided 25 minutes of face-to-face time during this this encounter and > 50% was spent counseling as documented under my assessment and plan.    Derwood Kaplan, MD [facility] [address] [place] [address] Dept: [phone] Dept Fax: [phone]   I, Rita Ohara,  am acting as scribe for Derwood Kaplan, MD  I have reviewed this report as typed by the medical scribe, and it is complete and accurate.

## 2020-08-01 ENCOUNTER — Other Ambulatory Visit: Payer: Self-pay | Admitting: Hematology and Oncology

## 2020-08-01 ENCOUNTER — Other Ambulatory Visit: Payer: Self-pay | Admitting: Oncology

## 2020-08-01 ENCOUNTER — Other Ambulatory Visit: Payer: Self-pay

## 2020-08-01 ENCOUNTER — Inpatient Hospital Stay: Payer: 59

## 2020-08-01 ENCOUNTER — Inpatient Hospital Stay: Payer: 59 | Attending: Oncology | Admitting: Oncology

## 2020-08-01 ENCOUNTER — Encounter: Payer: Self-pay | Admitting: Oncology

## 2020-08-01 VITALS — BP 170/88 | HR 99 | Temp 98.2°F | Resp 18 | Ht 71.0 in | Wt 301.4 lb

## 2020-08-01 DIAGNOSIS — D693 Immune thrombocytopenic purpura: Secondary | ICD-10-CM

## 2020-08-01 LAB — BASIC METABOLIC PANEL
BUN: 19 (ref 4–21)
CO2: 29 — AB (ref 13–22)
Chloride: 98 — AB (ref 99–108)
Creatinine: 0.7 (ref 0.6–1.3)
Glucose: 141
Potassium: 3.7 (ref 3.4–5.3)
Sodium: 138 (ref 137–147)

## 2020-08-01 LAB — CBC AND DIFFERENTIAL
HCT: 45 (ref 41–53)
Hemoglobin: 15.2 (ref 13.5–17.5)
Neutrophils Absolute: 13.04
Platelets: 112 — AB (ref 150–399)
WBC: 15.9

## 2020-08-01 LAB — COMPREHENSIVE METABOLIC PANEL
Albumin: 4.2 (ref 3.5–5.0)
Calcium: 9.7 (ref 8.7–10.7)

## 2020-08-01 LAB — HEPATIC FUNCTION PANEL
ALT: 34 (ref 10–40)
AST: 23 (ref 14–40)
Alkaline Phosphatase: 66 (ref 25–125)
Bilirubin, Total: 0.9

## 2020-08-01 LAB — CBC: RBC: 5.14 — AB (ref 3.87–5.11)

## 2020-08-02 ENCOUNTER — Other Ambulatory Visit: Payer: Self-pay | Admitting: Oncology

## 2020-08-02 DIAGNOSIS — D693 Immune thrombocytopenic purpura: Secondary | ICD-10-CM

## 2020-08-02 MED ORDER — PREDNISONE 10 MG PO TABS: 10.0000 mg | ORAL_TABLET | Freq: Three times a day (TID) | ORAL | 1 refills | Status: DC

## 2020-08-02 NOTE — Progress Notes (Signed)
p 

## 2020-08-08 ENCOUNTER — Other Ambulatory Visit: Payer: Self-pay | Admitting: Hematology and Oncology

## 2020-08-08 ENCOUNTER — Encounter: Payer: Self-pay | Admitting: Oncology

## 2020-08-08 ENCOUNTER — Inpatient Hospital Stay: Payer: 59

## 2020-08-08 ENCOUNTER — Other Ambulatory Visit: Payer: Self-pay

## 2020-08-08 ENCOUNTER — Telehealth: Payer: Self-pay

## 2020-08-08 LAB — BASIC METABOLIC PANEL
BUN: 22 — AB (ref 4–21)
CO2: 32 — AB (ref 13–22)
Chloride: 98 — AB (ref 99–108)
Creatinine: 0.7 (ref 0.6–1.3)
Glucose: 126
Potassium: 3.4 (ref 3.4–5.3)
Sodium: 138 (ref 137–147)

## 2020-08-08 LAB — CBC: RBC: 5.05 (ref 3.87–5.11)

## 2020-08-08 LAB — CBC AND DIFFERENTIAL
HCT: 44 (ref 41–53)
Hemoglobin: 14.9 (ref 13.5–17.5)
Neutrophils Absolute: 10.12
Platelets: 112 — AB (ref 150–399)
WBC: 15.1

## 2020-08-08 LAB — COMPREHENSIVE METABOLIC PANEL
Albumin: 4.2 (ref 3.5–5.0)
Calcium: 9.2 (ref 8.7–10.7)

## 2020-08-08 LAB — HEPATIC FUNCTION PANEL
ALT: 34 (ref 10–40)
AST: 22 (ref 14–40)
Alkaline Phosphatase: 60 (ref 25–125)
Bilirubin, Total: 1.1

## 2020-08-08 NOTE — Telephone Encounter (Signed)
-----   Message from Dellia Beckwith, MD sent at 08/08/2020 12:53 PM EST ----- Regarding: call pt Tell him platelets stable at 112,000, potassium (K) is sl low, rest looks good

## 2020-08-10 ENCOUNTER — Telehealth: Payer: Self-pay

## 2020-08-10 NOTE — Telephone Encounter (Signed)
-----   Message from Christine H McCarty, MD sent at 08/08/2020 12:53 PM EST ----- Regarding: call pt Tell him platelets stable at 112,000, potassium (K) is sl low, rest looks good  

## 2020-08-13 NOTE — Progress Notes (Signed)
[facility]  [address] [place],  [place]  [address] [phone]  Clinic Day:  08/15/2020  Referring physician: [name], NP   This document serves as a record of services personally performed by [name], MD. It was created on their behalf by Curry,Lauren E, a trained medical scribe. The creation of this record is based on the scribe's personal observations and the provider's statements to them.   CHIEF COMPLAINT:  CC: Thrombocytopenia  Current Treatment:  Prednisone 35 mg daily   HISTORY OF PRESENT ILLNESS:  Mike [name] is a 49 y.o. male with severe thrombocytopenia, felt to represent immune thrombocytopenic purpura.  He has had thrombocytopenia which has fluctuated up and down since June 2020.  Further laboratory evaluation did not reveal any other specific etiology for his thrombocytopenia.  He had mildly elevated liver transaminases.  Abdominal ultrasound revealed increased echogenicity of hepatic parenchyma suggesting hepatic steatosis, with probable fatty sparing adjacent to gallbladder fossa.  No other abnormality was seen.  We recommended bone marrow examination, but the patient and his mother declined.  The platelet count continued to fluctuate up and down, but as his platelet count remained 25,000 on June 7th, he was started on prednisone 20 mg 3 times daily.  His platelet count had increased to 73,000 on June 16th, so prednisone was continued at the same dose.  He had leukocytosis secondary to the high-dose prednisone.  He has had oral candidiasis on occasion.  His dose of prednisone was decreased from 60 mg to 50 mg daily in late August.  [name] titers were elevated, so this may be representative of chronic fatigue syndrome. We decreased his prednisone to 45 mg in late September and platelets remained stable at 97,000.  We were unable to taper him off the prednisone and so administered Rituximab weekly for 4 weeks in  October and he completed on November 17th.  We have now resumed a taper of his steroids.   INTERVAL HISTORY:  He is here for routine follow up and states that he has been well.  He only complains of fatigue and weakness.  This is especially apparent with stairs and picking up heavy objects, which may be indicative of steroid myopathy.  He continues prednisone 35 mg daily, and we will need to continue this dose for now.  I explained that this should improve with tapering his steroids.  His white count is stable at 15.3, his platelet count is stable to mildly worse at 109,000, and his hemoglobin is normal.  Chemistries are unremarkable except for a BUN of 23.  His  appetite is good, and he has gained 1 pound since his last visit.  He denies fever, chills or other signs of infection.  He denies nausea, vomiting, bowel issues, or abdominal pain.  He denies sore throat, cough, dyspnea, or chest pain.   REVIEW OF SYSTEMS:  Review of Systems  Constitutional: Positive for fatigue.       Weakness of the extremities.  HENT:  Negative.   Eyes: Negative.   Respiratory: Negative.   Cardiovascular: Negative.   Gastrointestinal: Negative.   Endocrine: Negative.   Genitourinary: Negative.    Musculoskeletal: Negative.   Skin: Negative.   Neurological: Negative.   Hematological: Negative.   Psychiatric/Behavioral: Negative.   All other systems reviewed and are negative.    VITALS:  Blood pressure (!) 163/87, pulse (!) 102, temperature 98.2 F (36.8 C), temperature source Oral, resp. rate 18, height 5'  11" (1.803 m), weight (!) 302 lb 4.8 oz (137.1 kg), SpO2 95 %.  Wt Readings from Last 3 Encounters:  08/15/20 (!) 302 lb 4.8 oz (137.1 kg)  08/01/20 (!) 301 lb 6.4 oz (136.7 kg)  07/18/20 297 lb 12 oz (135.1 kg)    Body mass index is 42.16 kg/m.  Performance status (ECOG): 1 - Symptomatic but completely ambulatory  PHYSICAL EXAM:  Physical Exam Constitutional:      General: He is not in acute  distress.    Appearance: Normal appearance. He is normal weight.  HENT:     Head: Normocephalic and atraumatic.  Eyes:     General: No scleral icterus.    Extraocular Movements: Extraocular movements intact.     Conjunctiva/sclera: Conjunctivae normal.     Pupils: Pupils are equal, round, and reactive to light.  Cardiovascular:     Rate and Rhythm: Regular rhythm. Tachycardia present.     Pulses: Normal pulses.     Heart sounds: Normal heart sounds. No murmur heard. No friction rub. No gallop.   Pulmonary:     Effort: Pulmonary effort is normal. No respiratory distress.     Breath sounds: Normal breath sounds.  Abdominal:     General: Bowel sounds are normal. There is no distension.     Palpations: Abdomen is soft. There is no hepatomegaly, splenomegaly or mass.     Tenderness: There is no abdominal tenderness.  Musculoskeletal:        General: Normal range of motion.     Cervical back: Normal range of motion and neck supple.     Right lower leg: No edema.     Left lower leg: No edema.  Lymphadenopathy:     Cervical: No cervical adenopathy.  Skin:    General: Skin is warm and dry.  Neurological:     General: No focal deficit present.     Mental Status: He is alert and oriented to person, place, and time. Mental status is at baseline.  Psychiatric:        Mood and Affect: Mood normal.        Behavior: Behavior normal.        Thought Content: Thought content normal.        Judgment: Judgment normal.     LABS:   CBC Latest Ref Rng & Units 08/08/2020 08/01/2020 07/23/2020  WBC - 15.1 15.9 14.3  Hemoglobin 13.5 - 17.5 14.9 15.2 14.8  Hematocrit 41 - 53 44 45 44  Platelets 150 - 399 112(A) 112(A) 98(A)   CMP Latest Ref Rng & Units 08/08/2020 08/01/2020 07/23/2020  BUN 4 - 21 22(A) 19 19  Creatinine 0.6 - 1.3 0.7 0.7 0.8  Sodium 137 - 147 138 138 138  Potassium 3.4 - 5.3 3.4 3.7 4.1  Chloride 99 - 108 98(A) 98(A) 98(A)  CO2 13 - 22 32(A) 29(A) 33(A)  Calcium 8.7 - 10.7  9.2 9.7 9.5  Alkaline Phos 25 - 125 60 66 57  AST 14 - 40 '22 23 23  ' ALT 10 - 40 34 34 35     STUDIES:  No results found.   Allergies: No Known Allergies  Current Medications: Current Outpatient Medications  Medication Sig Dispense Refill  . Accu-Chek FastClix Lancets MISC Apply topically daily.    Marland Kitchen ACCU-CHEK GUIDE test strip USE TO check blood sugar UP TO THREE TIMES DAILY    . amLODipine (NORVASC) 5 MG tablet Take 5 mg by mouth daily.    Marland Kitchen  Ascorbic Acid (VITAMIN C) 1000 MG tablet Take 1,000 mg by mouth daily.    . benazepril (LOTENSIN) 40 MG tablet Take 40 mg by mouth daily.    . Blood Glucose Monitoring Suppl (ACCU-CHEK GUIDE) w/Device KIT See admin instructions.    . cholecalciferol (VITAMIN D3) 25 MCG (1000 UNIT) tablet Take 1,000 Units by mouth daily.    Marland Kitchen escitalopram (LEXAPRO) 5 MG tablet Take 5 mg by mouth daily.    . fexofenadine (ALLEGRA) 180 MG tablet Take 180 mg by mouth daily.    . fluconazole (DIFLUCAN) 200 MG tablet Take 200 mg by mouth daily.    Marland Kitchen glipiZIDE (GLUCOTROL XL) 10 MG 24 hr tablet Take 10 mg by mouth daily.    . indapamide (LOZOL) 2.5 MG tablet Take 2.5 mg by mouth daily.    . metFORMIN (GLUCOPHAGE) 500 MG tablet Take 2,000 mg by mouth Nightly.    Marland Kitchen omeprazole (PRILOSEC) 20 MG capsule Take 20 mg by mouth 4 (four) times daily.    . potassium chloride SA (KLOR-CON) 20 MEQ tablet Take 20 mEq by mouth 3 (three) times daily.    . predniSONE (DELTASONE) 10 MG tablet Take 1 tablet (10 mg total) by mouth in the morning, at noon, and at bedtime. (Patient taking differently: Take 10 mg by mouth in the morning, at noon, and at bedtime. Takes 33m in morning and 124mat night) 90 tablet 1  . vitamin B-12 (CYANOCOBALAMIN) 1000 MCG tablet Take 1,000 mcg by mouth daily.    . vitamin k 100 MCG tablet Take 100 mcg by mouth daily.     No current facility-administered medications for this visit.     ASSESSMENT & PLAN:   Assessment:  1. Significant thrombocytopenia,  which has been fluctuating up and down since June 2020.  This is felt to represent immune thrombocytopenic purpura.  We placed him on prednisone 20 mg three times daily in early June.  His platelet count initially significantly increased to 73,000 on June 16th, but has fluctuated up and down since that time.  We have been unable to taper his steroids despite efforts for the last 3-4 months.  We started rituximab therapy in October and he completed 4 doses.  His platelet count remains above 100,000.  2. Leukocytosis, secondary to high dose steroids.  He does not have symptoms of infection.    3. Hypertension, secondary to high-dose prednisone.  He is currently on three different medications with better control.  He will continue to monitor this at home.      4. Hypokalemia, borderline normal.  He continues oral supplement 20 meq TID.    5. Steroid induced diabetes.  He has been placed on metformin 500 mg twice daily and glipizide 5 mg daily.  6.  Elevated Epstein Barr titers.  This may be indicative of chronic fatigue syndrome, and I advised that he rest as needed.  We had a discussion about this diagnosis.  His sister has started him on zinc, lemon balm, and L lysine.  7.  Generalized weakness consistent with steroid myopathy.  This should improve with tapering prednisone.  Plan: He will continue prednisone 35 mg daily for now, unless his platelet count is above 115,000.  He will continue with weekly labs.  We will see him back on December 29th with CBC and CMP for repeat evaluation.  The patient understands the plans discussed today and are in agreement with them.  They know to contact our office if he develops symptoms  of infection or other concerns prior to his next appointment.    I provided 25 minutes of face-to-face time during this this encounter and > 50% was spent counseling as documented under my assessment and plan.    Derwood Kaplan, MD [facility] AT [facility] [address] [place] [address] Dept: [phone] Dept Fax: [phone]   I, Rita Ohara, am acting as scribe for Derwood Kaplan, MD  I have reviewed this report as typed by the medical scribe, and it is complete and accurate.

## 2020-08-15 ENCOUNTER — Inpatient Hospital Stay: Payer: 59

## 2020-08-15 ENCOUNTER — Telehealth: Payer: Self-pay

## 2020-08-15 ENCOUNTER — Inpatient Hospital Stay (INDEPENDENT_AMBULATORY_CARE_PROVIDER_SITE_OTHER): Payer: 59 | Admitting: Oncology

## 2020-08-15 ENCOUNTER — Encounter: Payer: Self-pay | Admitting: Oncology

## 2020-08-15 ENCOUNTER — Other Ambulatory Visit: Payer: Self-pay | Admitting: Hematology and Oncology

## 2020-08-15 ENCOUNTER — Other Ambulatory Visit: Payer: Self-pay

## 2020-08-15 VITALS — BP 163/87 | HR 102 | Temp 98.2°F | Resp 18 | Ht 71.0 in | Wt 302.3 lb

## 2020-08-15 DIAGNOSIS — D693 Immune thrombocytopenic purpura: Secondary | ICD-10-CM

## 2020-08-15 LAB — BASIC METABOLIC PANEL
BUN: 23 — AB (ref 4–21)
CO2: 31 — AB (ref 13–22)
Chloride: 99 (ref 99–108)
Creatinine: 0.7 (ref 0.6–1.3)
Glucose: 121
Potassium: 3.6 (ref 3.4–5.3)
Sodium: 139 (ref 137–147)

## 2020-08-15 LAB — COMPREHENSIVE METABOLIC PANEL
Albumin: 4.3 (ref 3.5–5.0)
Calcium: 9.7 (ref 8.7–10.7)

## 2020-08-15 LAB — CBC AND DIFFERENTIAL
HCT: 45 (ref 41–53)
Hemoglobin: 15.3 (ref 13.5–17.5)
Neutrophils Absolute: 11.17
Platelets: 109 — AB (ref 150–399)
WBC: 15.3

## 2020-08-15 LAB — CBC: RBC: 5.1 (ref 3.87–5.11)

## 2020-08-15 LAB — HEPATIC FUNCTION PANEL
ALT: 32 (ref 10–40)
AST: 19 (ref 14–40)
Alkaline Phosphatase: 72 (ref 25–125)
Bilirubin, Total: 0.8

## 2020-08-15 NOTE — Telephone Encounter (Signed)
Patient notified of his lab results. 

## 2020-08-15 NOTE — Telephone Encounter (Signed)
-----   Message from Dellia Beckwith, MD sent at 08/15/2020  1:13 PM EST ----- Regarding: call pt Tell him potassium normal, stay on tid.  Rest all normal., BS 121

## 2020-08-17 ENCOUNTER — Telehealth: Payer: Self-pay | Admitting: Oncology

## 2020-08-17 NOTE — Telephone Encounter (Signed)
Per 12/15 No LOS Entered

## 2020-08-22 ENCOUNTER — Inpatient Hospital Stay: Payer: 59

## 2020-08-22 ENCOUNTER — Other Ambulatory Visit: Payer: Self-pay

## 2020-08-22 ENCOUNTER — Other Ambulatory Visit: Payer: Self-pay | Admitting: Hematology and Oncology

## 2020-08-22 LAB — CBC AND DIFFERENTIAL
HCT: 45 (ref 41–53)
Hemoglobin: 15.1 (ref 13.5–17.5)
Neutrophils Absolute: 12.84
Platelets: 103 — AB (ref 150–399)
WBC: 16.9

## 2020-08-22 LAB — BASIC METABOLIC PANEL
BUN: 21 (ref 4–21)
CO2: 29 — AB (ref 13–22)
Chloride: 99 (ref 99–108)
Creatinine: 0.6 (ref 0.6–1.3)
Glucose: 136
Potassium: 3.8 (ref 3.4–5.3)
Sodium: 138 (ref 137–147)

## 2020-08-22 LAB — CBC: RBC: 5.11 (ref 3.87–5.11)

## 2020-08-22 LAB — COMPREHENSIVE METABOLIC PANEL
Albumin: 4.4 (ref 3.5–5.0)
Calcium: 9.4 (ref 8.7–10.7)

## 2020-08-22 LAB — HEPATIC FUNCTION PANEL
ALT: 33 (ref 10–40)
AST: 23 (ref 14–40)
Alkaline Phosphatase: 59 (ref 25–125)
Bilirubin, Total: 0.9

## 2020-08-27 NOTE — Progress Notes (Signed)
[facility]  [address] [place],    [address] [phone]  Clinic Day:  08/29/2020  Referring physician: [name], NP   This document serves as a record of services personally performed by Mike Poisson, MD. It was created on their behalf by Mike [name], a trained medical scribe. The creation of this record is based on the scribe's personal observations and the provider's statements to them.   CHIEF COMPLAINT:  CC: Thrombocytopenia  Current Treatment:  Prednisone 35 mg daily   HISTORY OF PRESENT ILLNESS:  Mike [name] is a 49 y.o. male with severe thrombocytopenia, felt to represent immune thrombocytopenic purpura.  He has had thrombocytopenia which has fluctuated up and down since June 2020.  Further laboratory evaluation did not reveal any other specific etiology for his thrombocytopenia.  He had mildly elevated liver transaminases.  Abdominal ultrasound revealed increased echogenicity of hepatic parenchyma suggesting hepatic steatosis, with probable fatty sparing adjacent to gallbladder fossa.  No other abnormality was seen.  We recommended bone marrow examination, but [name] and his mother declined.  The platelet count continued to fluctuate up and down, but as his platelet count remained 25,000 on June 7th, he was started on prednisone 20 mg 3 times daily.  His platelet count had increased to 73,000 on June 16th, so prednisone was continued at the same dose.  He had leukocytosis secondary to the high-dose prednisone.  He has had oral candidiasis on occasion.  His dose of prednisone was decreased from 60 mg to 50 mg daily in late August.  Mike [name] titers were elevated, so this may be representative of chronic fatigue syndrome. We decreased his prednisone to 45 mg in late September and platelets remained stable at 97,000.  We were unable to taper him off the prednisone and so administered Rituximab weekly for 4 weeks in  October and he completed on November 17th.  We have now resumed a taper of his steroids.  His platelet count came up to 112,000 but has been slowly drifting down since that time.    INTERVAL HISTORY:  He is here for routine follow up and states that she has been well other than fatigue.  He continues prednisone 35 mg daily, and he will continue this dose for now.  White count has mildly elevated at 14.6 due to corticosteroids, his platelet count is stable at 102,000, and his hemoglobin is normal.  Chemistries are unremarkable.  His  appetite is good, and his weight is stable since his last visit.  He denies fever, chills or other signs of infection.  He denies nausea, vomiting, bowel issues, or abdominal pain.  He denies sore throat, cough, dyspnea, or chest pain.  REVIEW OF SYSTEMS:  Review of Systems  Constitutional: Positive for fatigue.  HENT:  Negative.   Eyes: Negative.   Respiratory: Negative.   Cardiovascular: Negative.   Gastrointestinal: Negative.   Endocrine: Negative.   Genitourinary: Negative.    Musculoskeletal: Negative.   Skin: Negative.   Neurological: Negative.   Hematological: Negative.   Psychiatric/Behavioral: Negative.   All other systems reviewed and are negative.    VITALS:  Blood pressure (!) 188/94, pulse (!) 108, temperature 98.6 F (37 C), resp. rate 18, height '5\' 11"'  (1.803 m), weight (!) 301 lb 12.8 oz (136.9 kg), SpO2 96 %.  Wt Readings from Last 3 Encounters:  08/29/20 (!) 301 lb 12.8 oz (136.9 kg)  08/15/20 (!) 302 lb 4.8 oz (137.1 kg)  08/01/20 (!) 301 lb 6.4 oz (136.7 kg)    Body mass index is 42.09 kg/m.  Performance status (ECOG): 1 - Symptomatic but completely ambulatory  PHYSICAL EXAM:  Physical Exam Constitutional:      General: He is not in acute distress.    Appearance: Normal appearance. He is normal weight.  HENT:     Head: Normocephalic and atraumatic.  Eyes:     General: No scleral icterus.    Extraocular Movements:  Extraocular movements intact.     Conjunctiva/sclera: Conjunctivae normal.     Pupils: Pupils are equal, round, and reactive to light.  Cardiovascular:     Rate and Rhythm: Regular rhythm. Tachycardia present.     Pulses: Normal pulses.     Heart sounds: Normal heart sounds. No murmur heard. No friction rub. No gallop.   Pulmonary:     Effort: Pulmonary effort is normal. No respiratory distress.     Breath sounds: Normal breath sounds.  Abdominal:     General: Bowel sounds are normal. There is no distension.     Palpations: Abdomen is soft. There is no hepatomegaly, splenomegaly or mass.     Tenderness: There is no abdominal tenderness.  Musculoskeletal:        General: Normal range of motion.     Cervical back: Normal range of motion and neck supple.     Right lower leg: Edema (trace) present.     Left lower leg: Edema (trace) present.  Lymphadenopathy:     Cervical: No cervical adenopathy.  Skin:    General: Skin is warm and dry.  Neurological:     General: No focal deficit present.     Mental Status: He is alert and oriented to person, place, and time. Mental status is at baseline.  Psychiatric:        Mood and Affect: Mood normal.        Behavior: Behavior normal.        Thought Content: Thought content normal.        Judgment: Judgment normal.     LABS:   CBC Latest Ref Rng & Units 08/22/2020 08/15/2020 08/08/2020  WBC - 16.9 15.3 15.1  Hemoglobin 13.5 - 17.5 15.1 15.3 14.9  Hematocrit 41 - 53 45 45 44  Platelets 150 - 399 103(A) 109(A) 112(A)   CMP Latest Ref Rng & Units 08/22/2020 08/15/2020 08/08/2020  BUN 4 - 21 21 23(A) 22(A)  Creatinine 0.6 - 1.3 0.6 0.7 0.7  Sodium 137 - 147 138 139 138  Potassium 3.4 - 5.3 3.8 3.6 3.4  Chloride 99 - 108 99 99 98(A)  CO2 13 - 22 29(A) 31(A) 32(A)  Calcium 8.7 - 10.7 9.4 9.7 9.2  Alkaline Phos 25 - 125 59 72 60  AST 14 - 40 '23 19 22  ' ALT 10 - 40 33 32 34     STUDIES:  No results found.   Allergies: No Known  Allergies  Current Medications: Current Outpatient Medications  Medication Sig Dispense Refill  . Accu-Chek FastClix Lancets MISC Apply topically daily.    Marland Kitchen ACCU-CHEK GUIDE test strip USE TO check blood sugar UP TO THREE TIMES DAILY    . amLODipine (NORVASC) 5 MG tablet Take 5 mg by mouth daily.    . Ascorbic Acid (VITAMIN C) 1000 MG tablet Take 1,000 mg by mouth daily.    . benazepril (LOTENSIN) 40 MG tablet Take 40 mg by mouth daily.    . Blood Glucose Monitoring Suppl (ACCU-CHEK  GUIDE) w/Device KIT See admin instructions.    . cholecalciferol (VITAMIN D3) 25 MCG (1000 UNIT) tablet Take 1,000 Units by mouth daily.    Marland Kitchen escitalopram (LEXAPRO) 5 MG tablet Take 5 mg by mouth daily.    . fexofenadine (ALLEGRA) 180 MG tablet Take 180 mg by mouth daily.    . fluconazole (DIFLUCAN) 200 MG tablet Take 200 mg by mouth daily.    Marland Kitchen glipiZIDE (GLUCOTROL XL) 10 MG 24 hr tablet Take 10 mg by mouth daily.    . indapamide (LOZOL) 2.5 MG tablet Take 2.5 mg by mouth daily.    . metFORMIN (GLUCOPHAGE) 500 MG tablet Take 2,000 mg by mouth Nightly.    Marland Kitchen omeprazole (PRILOSEC) 20 MG capsule Take 20 mg by mouth 4 (four) times daily.    . potassium chloride SA (KLOR-CON) 20 MEQ tablet Take 20 mEq by mouth 3 (three) times daily.    . predniSONE (DELTASONE) 10 MG tablet Take 1 tablet (10 mg total) by mouth in the morning, at noon, and at bedtime. ([name] taking differently: Take 10 mg by mouth in the morning, at noon, and at bedtime. Takes 72m in morning and 157mat night) 90 tablet 1  . vitamin B-12 (CYANOCOBALAMIN) 1000 MCG tablet Take 1,000 mcg by mouth daily.    . vitamin k 100 MCG tablet Take 100 mcg by mouth daily.     No current facility-administered medications for this visit.     ASSESSMENT & PLAN:   Assessment:  1. Significant thrombocytopenia, which has been fluctuating up and down since June 2020.  This is felt to represent immune thrombocytopenic purpura.  We placed him on prednisone 20 mg  three times daily in early June.  His platelet count initially significantly increased to 73,000 on June 16th, but has fluctuated up and down since that time.  We have been unable to taper his steroids despite efforts for the last 3-4 months.  We started rituximab therapy in October and he completed 4 doses.  His platelet count remains above 100,000.  2. Leukocytosis, secondary to high dose steroids.  He does not have symptoms of infection.    3. Hypertension, secondary to high-dose prednisone.  He is currently on a different medication.  He will continue to monitor this at home.      4. Hypokalemia, borderline normal.  He continues oral supplement 20 meq TID.    5. Steroid induced diabetes.  He has been placed on metformin 500 mg twice daily and glipizide 5 mg daily.  6.  Elevated Epstein Barr titers.  This may be indicative of chronic fatigue syndrome, and I advised that he rest as needed.  We had a discussion about this diagnosis.  His sister has started him on zinc, lemon balm, and L lysine.  7.  Generalized weakness consistent with steroid myopathy.  This should improve with tapering prednisone.  Plan: He will continue prednisone 35 mg daily for now, unless his platelet count is above 115,000, and I will refill his prescription today.  He will continue with weekly labs.  We will see him back on January 12th with CBC and CMP for repeat evaluation.  [name] understands the plans discussed today and are in agreement with them.  They know to contact our office if he develops symptoms of infection or other concerns prior to his next appointment.    I provided 20 minutes of face-to-face time during this this encounter and > 50% was spent counseling as documented under  my assessment and plan.    Derwood Kaplan, MD [facility] AT [facility] [address] [place] [address] Dept: [phone] Dept Fax: [phone]   I, Rita Ohara, am acting as scribe for Derwood Kaplan, MD  I have reviewed this report as typed by the medical scribe, and it is complete and accurate.

## 2020-08-29 ENCOUNTER — Telehealth: Payer: Self-pay | Admitting: Oncology

## 2020-08-29 ENCOUNTER — Other Ambulatory Visit: Payer: Self-pay | Admitting: Hematology and Oncology

## 2020-08-29 ENCOUNTER — Other Ambulatory Visit: Payer: Self-pay | Admitting: Oncology

## 2020-08-29 ENCOUNTER — Inpatient Hospital Stay: Payer: 59

## 2020-08-29 ENCOUNTER — Inpatient Hospital Stay (INDEPENDENT_AMBULATORY_CARE_PROVIDER_SITE_OTHER): Payer: 59 | Admitting: Oncology

## 2020-08-29 ENCOUNTER — Other Ambulatory Visit: Payer: Self-pay

## 2020-08-29 VITALS — BP 188/94 | HR 108 | Temp 98.6°F | Resp 18 | Ht 71.0 in | Wt 301.8 lb

## 2020-08-29 DIAGNOSIS — D693 Immune thrombocytopenic purpura: Secondary | ICD-10-CM | POA: Diagnosis not present

## 2020-08-29 LAB — BASIC METABOLIC PANEL
BUN: 19 (ref 4–21)
CO2: 31 — AB (ref 13–22)
Chloride: 97 — AB (ref 99–108)
Creatinine: 0.6 (ref 0.6–1.3)
Glucose: 134
Potassium: 3.5 (ref 3.4–5.3)
Sodium: 136 — AB (ref 137–147)

## 2020-08-29 LAB — HEPATIC FUNCTION PANEL
ALT: 32 (ref 10–40)
AST: 26 (ref 14–40)
Alkaline Phosphatase: 71 (ref 25–125)
Bilirubin, Total: 1

## 2020-08-29 LAB — CBC AND DIFFERENTIAL
HCT: 43 (ref 41–53)
Hemoglobin: 15 (ref 13.5–17.5)
Neutrophils Absolute: 9.64
Platelets: 102 — AB (ref 150–399)
WBC: 14.6

## 2020-08-29 LAB — COMPREHENSIVE METABOLIC PANEL
Albumin: 4.3 (ref 3.5–5.0)
Calcium: 9.6 (ref 8.7–10.7)

## 2020-08-29 LAB — CBC: RBC: 5.01 (ref 3.87–5.11)

## 2020-08-29 MED ORDER — PREDNISONE 20 MG PO TABS: 20.0000 mg | ORAL_TABLET | Freq: Two times a day (BID) | ORAL | 2 refills | Status: DC

## 2020-08-29 NOTE — Telephone Encounter (Signed)
Per 12/29 LOS, patient scheduled for Jan 2022 Appt's.  Gave patient 2 calendars

## 2020-09-06 ENCOUNTER — Inpatient Hospital Stay: Payer: 59 | Attending: Oncology | Admitting: Hematology and Oncology

## 2020-09-06 ENCOUNTER — Other Ambulatory Visit: Payer: Self-pay

## 2020-09-06 DIAGNOSIS — D693 Immune thrombocytopenic purpura: Secondary | ICD-10-CM

## 2020-09-06 LAB — CBC: RBC: 4.94 (ref 3.87–5.11)

## 2020-09-06 LAB — HEPATIC FUNCTION PANEL
ALT: 32 (ref 10–40)
AST: 23 (ref 14–40)
Alkaline Phosphatase: 63 (ref 25–125)
Bilirubin, Total: 1.1

## 2020-09-06 LAB — BASIC METABOLIC PANEL
BUN: 17 (ref 4–21)
CO2: 31 — AB (ref 13–22)
Chloride: 98 — AB (ref 99–108)
Creatinine: 0.6 (ref 0.6–1.3)
Glucose: 103
Potassium: 3.2 — AB (ref 3.4–5.3)
Sodium: 138 (ref 137–147)

## 2020-09-06 LAB — COMPREHENSIVE METABOLIC PANEL
Albumin: 4.3 (ref 3.5–5.0)
Calcium: 9.5 (ref 8.7–10.7)

## 2020-09-06 LAB — CBC AND DIFFERENTIAL
HCT: 43 (ref 41–53)
Hemoglobin: 14.6 (ref 13.5–17.5)
Neutrophils Absolute: 11.86
Platelets: 112 — AB (ref 150–399)
WBC: 17.7

## 2020-09-10 NOTE — Progress Notes (Signed)
Mike [name]  [address] [place],  [place]  [address] [phone]  Clinic Day:  09/12/2020  Referring physician: [name], NP   This document serves as a record of services personally performed by [name], MD. It was created on their behalf by Curry,Lauren E, a trained medical scribe. The creation of this record is based on the scribe's personal observations and the provider's statements to them.   CHIEF COMPLAINT:  CC: Thrombocytopenia  Current Treatment:  Prednisone 35 mg daily   HISTORY OF PRESENT ILLNESS:  Mike [name] is a 50 y.o. male with severe thrombocytopenia, felt to represent immune thrombocytopenic purpura.  He has had thrombocytopenia which has fluctuated up and down since June 2020.  Further laboratory evaluation did not reveal any other specific etiology for his thrombocytopenia.  He had mildly elevated liver transaminases.  Abdominal ultrasound revealed increased echogenicity of hepatic parenchyma suggesting hepatic steatosis, with probable fatty sparing adjacent to gallbladder fossa.  No other abnormality was seen.  We recommended bone marrow examination, but the patient and his mother declined.  The platelet count continued to fluctuate up and down, but as his platelet count remained 25,000 on June 7th, he was started on prednisone 20 mg 3 times daily.  His platelet count had increased to 73,000 on June 16th, so prednisone was continued at the same dose.  He had leukocytosis secondary to the high-dose prednisone.  He has had oral candidiasis on occasion.  His dose of prednisone was decreased from 60 mg to 50 mg daily in late August.  [name] titers were elevated, so this may be representative of chronic fatigue syndrome. We decreased his prednisone to 45 mg in late September and platelets remained stable at 97,000.  We were unable to taper him off the prednisone and so administered Rituximab weekly for 4 weeks in  October and he completed on November 17th.  We have now resumed a taper of his steroids.  His platelet count came up to 112,000 but has been slowly drifting down since that time.    INTERVAL HISTORY:  He is here for routine follow up and states that he has been well other than fatigue and insomnia.  He continues prednisone 35 mg, 20 mg in the morning and 15 mg in the evening.  I advised that he take the whole dose in the mornings to see if his insomnia improves.  His sister states that he has been falling asleep during the day and even fell asleep at work.  He does have sleep apnea, but has not been wearing his mouth piece lately.  I recommend that he be fitted for a CPAP.  His platelet count has mildly decreased from 112,000 to 103,000, his white count has mildly decreased from 17.7 to 15.2, and his hemoglobin is normal.  Chemistries are unremarkable.  His  appetite is good, and he has gained 6 and 1/2 pounds since his last visit.  He denies fever, chills or other signs of infection.  He denies nausea, vomiting, bowel issues, or abdominal pain.  He denies sore throat, cough, dyspnea, or chest pain.  REVIEW OF SYSTEMS:  Review of Systems  Constitutional: Positive for fatigue.  HENT:  Negative.   Eyes: Negative.   Respiratory: Negative.        Known sleep apnea  Cardiovascular: Negative.   Gastrointestinal: Negative.   Endocrine: Negative.   Genitourinary: Negative.    Musculoskeletal: Negative.   Skin: Negative.  Neurological: Negative.   Hematological: Negative.   Psychiatric/Behavioral: Positive for sleep disturbance (insomnia).  All other systems reviewed and are negative.    VITALS:  Blood pressure (!) 166/86, pulse (!) 105, temperature 97.7 F (36.5 C), temperature source Oral, resp. rate 18, height _0  (1.803 m), weight (!) 308 lb 6.4 oz (139.9 kg), SpO2 94 %.  Wt Readings from Last 3 Encounters:  09/12/20 (!) 308 lb 6.4 oz (139.9 kg)  08/29/20 (!) 301 lb 12.8 oz (136.9 kg)   08/15/20 (!) 302 lb 4.8 oz (137.1 kg)    Body mass index is 43.01 kg/m.  Performance status (ECOG): 1 - Symptomatic but completely ambulatory  PHYSICAL EXAM:  Physical Exam Constitutional:      General: He is not in acute distress.    Appearance: Normal appearance. He is normal weight.  HENT:     Head: Normocephalic and atraumatic.  Eyes:     General: No scleral icterus.    Extraocular Movements: Extraocular movements intact.     Conjunctiva/sclera: Conjunctivae normal.     Pupils: Pupils are equal, round, and reactive to light.  Cardiovascular:     Rate and Rhythm: Regular rhythm. Tachycardia present.     Pulses: Normal pulses.     Heart sounds: Normal heart sounds. No murmur heard. No friction rub. No gallop.   Pulmonary:     Effort: Pulmonary effort is normal. No respiratory distress.     Breath sounds: Normal breath sounds.  Abdominal:     General: Bowel sounds are normal. There is no distension.     Palpations: Abdomen is soft. There is no mass.     Tenderness: There is no abdominal tenderness.  Musculoskeletal:        General: Normal range of motion.     Cervical back: Normal range of motion and neck supple.     Right lower leg: No edema.     Left lower leg: No edema.  Lymphadenopathy:     Cervical: No cervical adenopathy.  Skin:    General: Skin is warm and dry.  Neurological:     General: No focal deficit present.     Mental Status: He is alert and oriented to person, place, and time. Mental status is at baseline.  Psychiatric:        Mood and Affect: Mood normal.        Behavior: Behavior normal.        Thought Content: Thought content normal.        Judgment: Judgment normal.     LABS:   CBC Latest Ref Rng & Units 09/06/2020 08/29/2020 08/22/2020  WBC - 17.7 14.6 16.9  Hemoglobin 13.5 - 17.5 14.6 15.0 15.1  Hematocrit 41 - 53 43 43 45  Platelets 150 - 399 112(A) 102(A) 103(A)   CMP Latest Ref Rng & Units 09/06/2020 08/29/2020 08/22/2020  BUN 4 - _1 Creatinine 0.6 - 1.3 0.6 0.6 0.6  Sodium 137 - 147 138 136(A) 138  Potassium 3.4 - 5.3 3.2(A) 3.5 3.8  Chloride 99 - 108 98(A) 97(A) 99  CO2 13 - 22 31(A) 31(A) 29(A)  Calcium 8.7 - 10.7 9.5 9.6 9.4  Alkaline Phos 25 - 125 63 71 59  AST 14 - 40 _2 ALT 10 - 40 32 32 33     STUDIES:  No results found.   Allergies: No Known Allergies  Current Medications: Current Outpatient Medications  Medication Sig Dispense Refill  .  Accu-Chek FastClix Lancets MISC Apply topically daily.    Marland Kitchen ACCU-CHEK GUIDE test strip USE TO check blood sugar UP TO THREE TIMES DAILY    . amLODipine (NORVASC) 10 MG tablet Take 10 mg by mouth daily.    . Ascorbic Acid (VITAMIN C) 1000 MG tablet Take 1,000 mg by mouth daily.    . benazepril (LOTENSIN) 40 MG tablet Take 40 mg by mouth daily.    . Blood Glucose Monitoring Suppl (ACCU-CHEK GUIDE) w/Device KIT See admin instructions.    . cholecalciferol (VITAMIN D3) 25 MCG (1000 UNIT) tablet Take 1,000 Units by mouth daily.    Marland Kitchen escitalopram (LEXAPRO) 5 MG tablet Take 5 mg by mouth daily.    . fexofenadine (ALLEGRA) 180 MG tablet Take 180 mg by mouth daily.    . fluconazole (DIFLUCAN) 200 MG tablet Take 200 mg by mouth daily.    Marland Kitchen glipiZIDE (GLUCOTROL XL) 10 MG 24 hr tablet Take 10 mg by mouth daily.    . indapamide (LOZOL) 2.5 MG tablet Take 2.5 mg by mouth daily.    . metFORMIN (GLUCOPHAGE) 500 MG tablet Take 2,000 mg by mouth Nightly.    Marland Kitchen omeprazole (PRILOSEC) 20 MG capsule Take 20 mg by mouth 4 (four) times daily.    . potassium chloride SA (KLOR-CON) 20 MEQ tablet Take 20 mEq by mouth 3 (three) times daily.    . predniSONE (DELTASONE) 20 MG tablet Take 1 tablet (20 mg total) by mouth 2 (two) times daily with a meal. 60 tablet 2  . vitamin B-12 (CYANOCOBALAMIN) 1000 MCG tablet Take 1,000 mcg by mouth daily.    . vitamin k 100 MCG tablet Take 100 mcg by mouth daily.     No current facility-administered medications for this visit.      ASSESSMENT & PLAN:   Assessment:  1. Significant thrombocytopenia, which has been fluctuating up and down since June 2020.  This is felt to represent immune thrombocytopenic purpura.  We placed him on prednisone 20 mg three times daily in early June.  His platelet count initially significantly increased to 73,000 on June 16th, but has fluctuated up and down since that time.  We have been unable to taper his steroids despite efforts for the last 3-4 months.  We started rituximab therapy in October and he completed 4 doses.  His platelet count remains above 100,000.  2. Leukocytosis, secondary to high dose steroids.  He does not have symptoms of infection.    3. Hypertension, secondary to high-dose prednisone.  He is currently on a different medication.  He will continue to monitor this at home.      4. Hypokalemia, resolved at this time.  He continues oral supplement 20 meq TID.    5. Steroid induced diabetes.  He has been placed on metformin 500 mg twice daily and glipizide 5 mg daily.  6.  Elevated Epstein Barr titers.  This may be indicative of chronic fatigue syndrome, and I advised that he rest as needed.  We had a discussion about this diagnosis.  His sister has started him on zinc, lemon balm, and L lysine.  7.  Generalized weakness consistent with steroid myopathy.  This should improve with tapering prednisone.  8.  Sleep apnea, untreated.  I encouraged him to use his CPAP mask for better quality of life and to reduce his daytime sleepiness.   Plan: At this time, he will continue prednisone 35 mg daily for now, unless his platelet count is above 115,000.  I advised that he take the whole dose in the mornings to try and improve his insomnia.  He will continue with weekly labs.  We will see him back on January 26th with CBC and CMP for repeat evaluation.  The patient understands the plans discussed today and are in agreement with them.  They know to contact our office if he develops  symptoms of infection or other concerns prior to his next appointment.    I provided 20 minutes of face-to-face time during this this encounter and > 50% was spent counseling as documented under my assessment and plan.    Derwood Kaplan, MD [facility] AT [facility] [address] [place] [address] Dept: [phone] Dept Fax: [phone]   I, Rita Ohara, am acting as scribe for Derwood Kaplan, MD  I have reviewed this report as typed by the medical scribe, and it is complete and accurate.

## 2020-09-12 ENCOUNTER — Inpatient Hospital Stay (INDEPENDENT_AMBULATORY_CARE_PROVIDER_SITE_OTHER): Payer: 59 | Admitting: Oncology

## 2020-09-12 ENCOUNTER — Other Ambulatory Visit: Payer: Self-pay

## 2020-09-12 ENCOUNTER — Other Ambulatory Visit: Payer: Self-pay | Admitting: Hematology and Oncology

## 2020-09-12 ENCOUNTER — Inpatient Hospital Stay: Payer: 59

## 2020-09-12 ENCOUNTER — Encounter: Payer: Self-pay | Admitting: Oncology

## 2020-09-12 VITALS — BP 166/86 | HR 105 | Temp 97.7°F | Resp 18 | Ht 71.0 in | Wt 308.4 lb

## 2020-09-12 DIAGNOSIS — D693 Immune thrombocytopenic purpura: Secondary | ICD-10-CM | POA: Diagnosis not present

## 2020-09-12 LAB — BASIC METABOLIC PANEL
BUN: 20 (ref 4–21)
CO2: 29 — AB (ref 13–22)
Chloride: 100 (ref 99–108)
Creatinine: 0.6 (ref 0.6–1.3)
Glucose: 128
Potassium: 3.8 (ref 3.4–5.3)
Sodium: 136 — AB (ref 137–147)

## 2020-09-12 LAB — CBC AND DIFFERENTIAL
HCT: 43 (ref 41–53)
Hemoglobin: 14.5 (ref 13.5–17.5)
Neutrophils Absolute: 10.79
Platelets: 103 — AB (ref 150–399)
WBC: 15.2

## 2020-09-12 LAB — HEPATIC FUNCTION PANEL
ALT: 33 (ref 10–40)
AST: 26 (ref 14–40)
Alkaline Phosphatase: 64 (ref 25–125)
Bilirubin, Total: 0.8

## 2020-09-12 LAB — COMPREHENSIVE METABOLIC PANEL
Albumin: 4.2 (ref 3.5–5.0)
Calcium: 9.5 (ref 8.7–10.7)

## 2020-09-12 LAB — CBC: RBC: 4.92 (ref 3.87–5.11)

## 2020-09-20 ENCOUNTER — Other Ambulatory Visit: Payer: Self-pay

## 2020-09-20 ENCOUNTER — Inpatient Hospital Stay: Payer: 59

## 2020-09-20 ENCOUNTER — Other Ambulatory Visit: Payer: Self-pay | Admitting: Hematology and Oncology

## 2020-09-20 LAB — CBC AND DIFFERENTIAL
HCT: 43 (ref 41–53)
Hemoglobin: 14.4 (ref 13.5–17.5)
Neutrophils Absolute: 10.35
Platelets: 98 — AB (ref 150–399)
WBC: 15

## 2020-09-20 LAB — BASIC METABOLIC PANEL
BUN: 23 — AB (ref 4–21)
CO2: 29 — AB (ref 13–22)
Chloride: 101 (ref 99–108)
Creatinine: 0.7 (ref 0.6–1.3)
Glucose: 123
Potassium: 3.5 (ref 3.4–5.3)
Sodium: 139 (ref 137–147)

## 2020-09-20 LAB — HEPATIC FUNCTION PANEL
ALT: 31 (ref 10–40)
AST: 22 (ref 14–40)
Alkaline Phosphatase: 58 (ref 25–125)
Bilirubin, Total: 1

## 2020-09-20 LAB — COMPREHENSIVE METABOLIC PANEL
Albumin: 4.3 (ref 3.5–5.0)
Calcium: 9.4 (ref 8.7–10.7)

## 2020-09-20 LAB — CBC: RBC: 4.86 (ref 3.87–5.11)

## 2020-09-24 NOTE — Progress Notes (Signed)
[place]  [address] [place],  [place]  [address] [phone]  Clinic Day:  09/26/2020  Referring physician: [name], NP   This document serves as a record of services personally performed by [name], MD. It was created on their behalf by Curry,Lauren E, a trained medical scribe. The creation of this record is based on the scribe's personal observations and the provider's statements to them.   CHIEF COMPLAINT:  CC: Thrombocytopenia  Current Treatment:  Prednisone 35 mg daily   HISTORY OF PRESENT ILLNESS:  Mike [name] is a 50 y.o. male with severe thrombocytopenia, felt to represent immune thrombocytopenic purpura.  He has had thrombocytopenia which has fluctuated up and down since June 2020.  Further laboratory evaluation did not reveal any other specific etiology for his thrombocytopenia.  He had mildly elevated liver transaminases.  Abdominal ultrasound revealed increased echogenicity of hepatic parenchyma suggesting hepatic steatosis, with probable fatty sparing adjacent to gallbladder fossa.  No other abnormality was seen.  We recommended bone marrow examination, but the patient and his mother declined.  The platelet count continued to fluctuate up and down, but as his platelet count remained 25,000 on June 7th, he was started on prednisone 20 mg 3 times daily.  His platelet count had increased to 73,000 on June 16th, so prednisone was continued at the same dose.  He had leukocytosis secondary to the high-dose prednisone.  He has had oral candidiasis on occasion.  His dose of prednisone was decreased from 60 mg to 50 mg daily in late August.  [name] titers were elevated, so this may be representative of chronic fatigue syndrome. We decreased his prednisone to 45 mg in late September and platelets remained stable at 97,000.  We were unable to taper him off the prednisone and so administered Rituximab weekly for 4 weeks in  October and he completed on November 17th.  We have now resumed a taper of his steroids.  His platelet count came up to 112,000 but has been slowly drifting down since that time.    INTERVAL HISTORY:  He is here for routine follow up and states that he has been doing well.  He continues prednisone 35 mg daily.  He continues to be anxious about his platelet counts.  He does complain of fatigue.  His platelet count has increased from 98,000 to 104,000, his white count is stable at 15.3, and his hemoglobin is normal.  Chemistries are unremarkable.  His  appetite is good, and he has lost 4 pounds since his last visit.  He denies fever, chills or other signs of infection.  He denies nausea, vomiting, bowel issues, or abdominal pain.  He denies sore throat, cough, dyspnea, or chest pain.  REVIEW OF SYSTEMS:  Review of Systems  Constitutional: Positive for fatigue.  HENT:  Negative.   Eyes: Negative.   Respiratory: Negative.   Cardiovascular: Negative.   Gastrointestinal: Negative.   Endocrine: Negative.   Genitourinary: Negative.    Musculoskeletal: Negative.   Skin: Negative.   Neurological: Negative.   Hematological: Negative.   Psychiatric/Behavioral: Negative.   All other systems reviewed and are negative.    VITALS:  Blood pressure (!) 156/90, pulse 100, temperature 97.6 F (36.4 C), temperature source Oral, resp. rate 18, height '5\' 11"'  (1.803 m), weight (!) 304 lb 6.4 oz (138.1 kg), SpO2 94 %.  Wt Readings from Last 3 Encounters:  09/26/20 (!) 304 lb 6.4 oz (138.1 kg)  09/12/20 Marland Kitchen)  308 lb 6.4 oz (139.9 kg)  08/29/20 (!) 301 lb 12.8 oz (136.9 kg)    Body mass index is 42.46 kg/m.  Performance status (ECOG): 1 - Symptomatic but completely ambulatory  PHYSICAL EXAM:  Physical Exam Constitutional:      General: He is not in acute distress.    Appearance: Normal appearance. He is normal weight.  HENT:     Head: Normocephalic and atraumatic.  Eyes:     General: No scleral  icterus.    Extraocular Movements: Extraocular movements intact.     Conjunctiva/sclera: Conjunctivae normal.     Pupils: Pupils are equal, round, and reactive to light.  Cardiovascular:     Rate and Rhythm: Normal rate and regular rhythm.     Pulses: Normal pulses.     Heart sounds: Normal heart sounds. No murmur heard. No friction rub. No gallop.   Pulmonary:     Effort: Pulmonary effort is normal. No respiratory distress.     Breath sounds: Normal breath sounds.  Abdominal:     General: Bowel sounds are normal. There is no distension.     Palpations: Abdomen is soft. There is no mass.     Tenderness: There is no abdominal tenderness.  Musculoskeletal:        General: Normal range of motion.     Cervical back: Normal range of motion and neck supple.     Right lower leg: Edema (trace) present.     Left lower leg: Edema (trace) present.  Lymphadenopathy:     Cervical: No cervical adenopathy.  Skin:    General: Skin is warm and dry.     Comments: He has a bruise of the right forearm  Neurological:     General: No focal deficit present.     Mental Status: He is alert and oriented to person, place, and time. Mental status is at baseline.  Psychiatric:        Mood and Affect: Mood normal.        Behavior: Behavior normal.        Thought Content: Thought content normal.        Judgment: Judgment normal.     LABS:   CBC Latest Ref Rng & Units 09/20/2020 09/12/2020 09/06/2020  WBC - 15.0 15.2 17.7  Hemoglobin 13.5 - 17.5 14.4 14.5 14.6  Hematocrit 41 - 53 43 43 43  Platelets 150 - 399 98(A) 103(A) 112(A)   CMP Latest Ref Rng & Units 09/20/2020 09/12/2020 09/06/2020  BUN 4 - 21 23(A) 20 17  Creatinine 0.6 - 1.3 0.7 0.6 0.6  Sodium 137 - 147 139 136(A) 138  Potassium 3.4 - 5.3 3.5 3.8 3.2(A)  Chloride 99 - 108 101 100 98(A)  CO2 13 - 22 29(A) 29(A) 31(A)  Calcium 8.7 - 10.7 9.4 9.5 9.5  Alkaline Phos 25 - 125 58 64 63  AST 14 - 40 '22 26 23  ' ALT 10 - 40 31 33 32     STUDIES:   No results found.   Allergies: No Known Allergies  Current Medications: Current Outpatient Medications  Medication Sig Dispense Refill  . Accu-Chek FastClix Lancets MISC Apply topically daily.    Marland Kitchen ACCU-CHEK GUIDE test strip USE TO check blood sugar UP TO THREE TIMES DAILY    . amLODipine (NORVASC) 10 MG tablet Take 10 mg by mouth daily.    . Ascorbic Acid (VITAMIN C) 1000 MG tablet Take 1,000 mg by mouth daily.    . benazepril (LOTENSIN)  40 MG tablet Take 40 mg by mouth daily.    . Blood Glucose Monitoring Suppl (ACCU-CHEK GUIDE) w/Device KIT See admin instructions.    . cholecalciferol (VITAMIN D3) 25 MCG (1000 UNIT) tablet Take 1,000 Units by mouth daily.    Marland Kitchen escitalopram (LEXAPRO) 5 MG tablet Take 5 mg by mouth daily.    . fexofenadine (ALLEGRA) 180 MG tablet Take 180 mg by mouth daily.    . fluconazole (DIFLUCAN) 200 MG tablet Take 200 mg by mouth daily.    Marland Kitchen glipiZIDE (GLUCOTROL XL) 10 MG 24 hr tablet Take 10 mg by mouth daily.    . indapamide (LOZOL) 2.5 MG tablet Take 2.5 mg by mouth daily.    . metFORMIN (GLUCOPHAGE) 500 MG tablet Take 2,000 mg by mouth Nightly.    Marland Kitchen omeprazole (PRILOSEC) 20 MG capsule Take 20 mg by mouth 4 (four) times daily.    . potassium chloride SA (KLOR-CON) 20 MEQ tablet Take 20 mEq by mouth 3 (three) times daily.    . predniSONE (DELTASONE) 10 MG tablet Take 1 tablet (10 mg total) by mouth in the morning, at noon, and at bedtime.    . predniSONE (DELTASONE) 20 MG tablet Take 1 tablet (20 mg total) by mouth 2 (two) times daily with a meal. 60 tablet 2  . vitamin B-12 (CYANOCOBALAMIN) 1000 MCG tablet Take 1,000 mcg by mouth daily.    . vitamin k 100 MCG tablet Take 100 mcg by mouth daily.     No current facility-administered medications for this visit.     ASSESSMENT & PLAN:   Assessment:  1. Significant thrombocytopenia, which has been fluctuating up and down since June 2020.  This is felt to represent immune thrombocytopenic purpura.  We placed  him on prednisone 20 mg three times daily in early June.  His platelet count initially significantly increased to 73,000 on June 16th, but has fluctuated up and down since that time.  We have been unable to taper his steroids despite efforts for the last 3-4 months.  We started rituximab therapy in October and he completed 4 doses.  His platelet count remains above 100,000, but he has been reluctant to taper the prednisone with the fluctuating levels.  2. Leukocytosis, secondary to high dose steroids.  He does not have symptoms of infection.    3. Hypertension, secondary to high-dose prednisone.  He is currently on a different medication.  He will continue to monitor this at home.      4. Hypokalemia, resolved at this time.  He continues oral supplement 20 meq TID.    5. Steroid induced diabetes.  He has been placed on metformin 500 mg twice daily and glipizide 5 mg daily.  6.  Elevated Epstein Barr titers.  This may be indicative of chronic fatigue syndrome, and I advised that he rest as needed.  We had a discussion about this diagnosis.  His sister has started him on zinc, lemon balm, and L lysine.  7.  Generalized weakness consistent with steroid myopathy.  This should improve with tapering prednisone.  8.  Sleep apnea, untreated.  I encouraged him to use his CPAP mask for better quality of life and to reduce his daytime sleepiness.   Plan: At this time, he will continue prednisone 35 mg daily for now at his request.  We may decrease this to 30 mg at his next visit if he is comfortable.  He will continue with weekly labs.  We will see him back  on February 9th with CBC and CMP for repeat evaluation.  The patient understands the plans discussed today and are in agreement with them.  They know to contact our office if he develops symptoms of infection or other concerns prior to his next appointment.    I provided 20 minutes of face-to-face time during this this encounter and > 50% was spent  counseling as documented under my assessment and plan.    Derwood Kaplan, MD [facility] AT [facility] [address] [place] [address] Dept: [phone] Dept Fax: [phone]   I, Rita Ohara, am acting as scribe for Derwood Kaplan, MD  I have reviewed this report as typed by the medical scribe, and it is complete and accurate.

## 2020-09-26 ENCOUNTER — Other Ambulatory Visit: Payer: Self-pay | Admitting: Hematology and Oncology

## 2020-09-26 ENCOUNTER — Other Ambulatory Visit: Payer: Self-pay | Admitting: Oncology

## 2020-09-26 ENCOUNTER — Inpatient Hospital Stay (INDEPENDENT_AMBULATORY_CARE_PROVIDER_SITE_OTHER): Payer: 59 | Admitting: Oncology

## 2020-09-26 ENCOUNTER — Other Ambulatory Visit: Payer: Self-pay

## 2020-09-26 ENCOUNTER — Telehealth: Payer: Self-pay | Admitting: Oncology

## 2020-09-26 ENCOUNTER — Inpatient Hospital Stay: Payer: 59

## 2020-09-26 VITALS — BP 156/90 | HR 100 | Temp 97.6°F | Resp 18 | Ht 71.0 in | Wt 304.4 lb

## 2020-09-26 DIAGNOSIS — D693 Immune thrombocytopenic purpura: Secondary | ICD-10-CM

## 2020-09-26 LAB — BASIC METABOLIC PANEL
BUN: 17 (ref 4–21)
CO2: 31 — AB (ref 13–22)
Chloride: 98 — AB (ref 99–108)
Creatinine: 0.8 (ref 0.6–1.3)
Glucose: 108
Potassium: 3.6 (ref 3.4–5.3)
Sodium: 138 (ref 137–147)

## 2020-09-26 LAB — COMPREHENSIVE METABOLIC PANEL
Albumin: 4.2 (ref 3.5–5.0)
Calcium: 9.5 (ref 8.7–10.7)

## 2020-09-26 LAB — HEPATIC FUNCTION PANEL
ALT: 30 (ref 10–40)
AST: 20 (ref 14–40)
Alkaline Phosphatase: 73 (ref 25–125)
Bilirubin, Total: 0.9

## 2020-09-26 LAB — CBC AND DIFFERENTIAL
HCT: 43 (ref 41–53)
Hemoglobin: 14.5 (ref 13.5–17.5)
Neutrophils Absolute: 11.02
Platelets: 104 — AB (ref 150–399)
WBC: 15.3

## 2020-09-26 LAB — CBC: RBC: 4.85 (ref 3.87–5.11)

## 2020-09-26 NOTE — Telephone Encounter (Signed)
Per 09/26/20 los next appts given to patient

## 2020-10-04 ENCOUNTER — Other Ambulatory Visit: Payer: Self-pay

## 2020-10-04 ENCOUNTER — Telehealth: Payer: Self-pay

## 2020-10-04 ENCOUNTER — Inpatient Hospital Stay: Payer: 59 | Attending: Oncology

## 2020-10-04 ENCOUNTER — Other Ambulatory Visit: Payer: Self-pay | Admitting: Hematology and Oncology

## 2020-10-04 LAB — CBC AND DIFFERENTIAL
HCT: 41 (ref 41–53)
Hemoglobin: 14.1 (ref 13.5–17.5)
Neutrophils Absolute: 10.08
Platelets: 102 — AB (ref 150–399)
WBC: 15.5

## 2020-10-04 LAB — BASIC METABOLIC PANEL
BUN: 18 (ref 4–21)
CO2: 31 — AB (ref 13–22)
Chloride: 100 (ref 99–108)
Creatinine: 0.6 (ref 0.6–1.3)
Glucose: 104
Potassium: 3.6 (ref 3.4–5.3)
Sodium: 138 (ref 137–147)

## 2020-10-04 LAB — COMPREHENSIVE METABOLIC PANEL
Albumin: 4.1 (ref 3.5–5.0)
Calcium: 9.4 (ref 8.7–10.7)

## 2020-10-04 LAB — HEPATIC FUNCTION PANEL
ALT: 30 (ref 10–40)
AST: 23 (ref 14–40)
Alkaline Phosphatase: 65 (ref 25–125)
Bilirubin, Total: 0.8

## 2020-10-04 LAB — CBC: RBC: 4.74 (ref 3.87–5.11)

## 2020-10-04 NOTE — Telephone Encounter (Signed)
No answer, will try back later.

## 2020-10-05 ENCOUNTER — Telehealth: Payer: Self-pay

## 2020-10-05 NOTE — Telephone Encounter (Signed)
Patient notified of platelet count and other labs.

## 2020-10-05 NOTE — Telephone Encounter (Signed)
Attempted to contact patient to notify him of his platelet count.

## 2020-10-08 NOTE — Progress Notes (Signed)
[facility]  [address] [place],  [place]  [address] [phone]  Clinic Day:  10/10/2020  Referring physician: [name], NP   This document serves as a record of services personally performed by [name], MD. It was created on their behalf by Curry,Lauren E, a trained medical scribe. The creation of this record is based on the scribe's personal observations and the provider's statements to them.   CHIEF COMPLAINT:  CC: Thrombocytopenia  Current Treatment:  Prednisone 30 mg daily   HISTORY OF PRESENT ILLNESS:  Mike [name] is a 50 y.o. male with severe thrombocytopenia, felt to represent immune thrombocytopenic purpura.  He has had thrombocytopenia which has fluctuated up and down since June 2020.  Further laboratory evaluation did not reveal any other specific etiology for his thrombocytopenia.  He had mildly elevated liver transaminases.  Abdominal ultrasound revealed increased echogenicity of hepatic parenchyma suggesting hepatic steatosis, with probable fatty sparing adjacent to gallbladder fossa.  No other abnormality was seen.  We recommended bone marrow examination, but the patient and his mother declined.  The platelet count continued to fluctuate up and down, but as his platelet count remained 25,000 on June 7th, he was started on prednisone 20 mg 3 times daily.  His platelet count had increased to 73,000 on June 16th, so prednisone was continued at the same dose.  He had leukocytosis secondary to the high-dose prednisone.  He has had oral candidiasis on occasion.  His dose of prednisone was decreased from 60 mg to 50 mg daily in late August.  [name] titers were elevated, so this may be representative of chronic fatigue syndrome. We decreased his prednisone to 45 mg in late September and platelets remained stable at 97,000.  We were unable to taper him off the prednisone and so administered Rituximab weekly for 4 weeks in  October and he completed on November 17th.  We have now resumed a taper of his steroids.  His platelet count came up to 112,000 but has been slowly drifting down since that time.    INTERVAL HISTORY:  He is here for routine follow up and states that he is doing fairly well.  He continues prednisone 35 mg daily.  He will be increasing his Lexapro to 10 mg daily tomorrow.  He still remains anxious about his blood counts.  His platelet count has decreased from 102,000 to 99,000, his white count is 16.0, and his hemoglobin is normal.  We will plan to try decreasing the prednisone dose to 30 mg daily.  Chemistries are unremarkable except for a BUN of 21.  Hemoglobin A1c is 6.5.  His  appetite is good, and he has gained 5 pounds since his last visit.  He denies fever, chills or other signs of infection.  He denies nausea, vomiting, bowel issues, or abdominal pain.  He denies sore throat, cough, dyspnea, or chest pain.  REVIEW OF SYSTEMS:  Review of Systems  HENT:  Negative.   Eyes: Negative.   Respiratory: Negative.   Cardiovascular: Negative.   Gastrointestinal: Negative.   Endocrine: Negative.   Genitourinary: Negative.    Musculoskeletal: Negative.   Skin: Negative.   Neurological: Negative.   Hematological: Negative.   Psychiatric/Behavioral: The patient is nervous/anxious.   All other systems reviewed and are negative.    VITALS:  Blood pressure (!) 144/85, pulse 98, temperature 97.7 F (36.5 C), resp. rate 18, height _0  (1.803 m), weight (!) 309 lb 9.6 oz (  140.4 kg), SpO2 96 %.  Wt Readings from Last 3 Encounters:  10/10/20 (!) 309 lb 9.6 oz (140.4 kg)  09/26/20 (!) 304 lb 6.4 oz (138.1 kg)  09/12/20 (!) 308 lb 6.4 oz (139.9 kg)    Body mass index is 43.18 kg/m.  Performance status (ECOG): 1 - Symptomatic but completely ambulatory  PHYSICAL EXAM:  Physical Exam Constitutional:      General: He is not in acute distress.    Appearance: Normal appearance. He is normal weight.   HENT:     Head: Normocephalic and atraumatic.  Eyes:     General: No scleral icterus.    Extraocular Movements: Extraocular movements intact.     Conjunctiva/sclera: Conjunctivae normal.     Pupils: Pupils are equal, round, and reactive to light.  Cardiovascular:     Rate and Rhythm: Normal rate and regular rhythm.     Pulses: Normal pulses.     Heart sounds: Normal heart sounds. No murmur heard. No friction rub. No gallop.   Pulmonary:     Effort: Pulmonary effort is normal. No respiratory distress.     Breath sounds: Normal breath sounds.  Abdominal:     General: Bowel sounds are normal. There is no distension.     Palpations: Abdomen is soft. There is no mass.     Tenderness: There is no abdominal tenderness.  Musculoskeletal:        General: Normal range of motion.     Cervical back: Normal range of motion and neck supple.     Right lower leg: Edema (mild) present.     Left lower leg: Edema (mild) present.  Lymphadenopathy:     Cervical: No cervical adenopathy.  Skin:    General: Skin is warm and dry.  Neurological:     General: No focal deficit present.     Mental Status: He is alert and oriented to person, place, and time. Mental status is at baseline.  Psychiatric:        Mood and Affect: Mood normal.        Behavior: Behavior normal.        Thought Content: Thought content normal.        Judgment: Judgment normal.     LABS:   CBC Latest Ref Rng & Units 10/04/2020 09/26/2020 09/20/2020  WBC - 15.5 15.3 15.0  Hemoglobin 13.5 - 17.5 14.1 14.5 14.4  Hematocrit 41 - 53 41 43 43  Platelets 150 - 399 102(A) 104(A) 98(A)   CMP Latest Ref Rng & Units 10/04/2020 09/26/2020 09/20/2020  BUN 4 - _0 23(A)  Creatinine 0.6 - 1.3 0.6 0.8 0.7  Sodium 137 - 147 138 138 139  Potassium 3.4 - 5.3 3.6 3.6 3.5  Chloride 99 - 108 100 98(A) 101  CO2 13 - 22 31(A) 31(A) 29(A)  Calcium 8.7 - 10.7 9.4 9.5 9.4  Alkaline Phos 25 - 125 65 73 58  AST 14 - 40 _1 ALT 10 - 40 _2 STUDIES:  No results found.   Allergies: No Known Allergies  Current Medications: Current Outpatient Medications  Medication Sig Dispense Refill  . Accu-Chek FastClix Lancets MISC Apply topically daily.    Marland Kitchen ACCU-CHEK GUIDE test strip USE TO check blood sugar UP TO THREE TIMES DAILY    . amLODipine (NORVASC) 10 MG tablet Take 10 mg by mouth daily.    . Ascorbic Acid (VITAMIN C) 1000 MG tablet Take  1,000 mg by mouth daily.    . benazepril (LOTENSIN) 40 MG tablet Take 40 mg by mouth daily.    . Blood Glucose Monitoring Suppl (ACCU-CHEK GUIDE) w/Device KIT See admin instructions.    . cholecalciferol (VITAMIN D3) 25 MCG (1000 UNIT) tablet Take 1,000 Units by mouth daily.    Marland Kitchen escitalopram (LEXAPRO) 10 MG tablet Take 10 mg by mouth daily. STARTED ON 10MG AS OF YESTERDAY.    . fexofenadine (ALLEGRA) 180 MG tablet Take 180 mg by mouth daily.    . fluconazole (DIFLUCAN) 200 MG tablet Take 200 mg by mouth daily.    Marland Kitchen glipiZIDE (GLUCOTROL XL) 10 MG 24 hr tablet Take 10 mg by mouth daily.    . indapamide (LOZOL) 2.5 MG tablet Take 2.5 mg by mouth daily.    . metFORMIN (GLUCOPHAGE) 500 MG tablet Take 2,000 mg by mouth Nightly.    Marland Kitchen omeprazole (PRILOSEC) 20 MG capsule Take 20 mg by mouth 4 (four) times daily.    . potassium chloride SA (KLOR-CON) 20 MEQ tablet Take 20 mEq by mouth 3 (three) times daily.    . predniSONE (DELTASONE) 10 MG tablet Take 1 tablet (10 mg total) by mouth in the morning, at noon, and at bedtime.    . predniSONE (DELTASONE) 20 MG tablet Take 1 tablet (20 mg total) by mouth 2 (two) times daily with a meal. 60 tablet 2  . vitamin B-12 (CYANOCOBALAMIN) 1000 MCG tablet Take 1,000 mcg by mouth daily.    . vitamin k 100 MCG tablet Take 100 mcg by mouth daily.     No current facility-administered medications for this visit.     ASSESSMENT & PLAN:   Assessment:  1. Significant thrombocytopenia, which has been fluctuating up and down since June 2020.  This is felt  to represent immune thrombocytopenic purpura.  We placed him on prednisone 20 mg three times daily in early June.  His platelet count initially significantly increased to 73,000 on June 16th, but has fluctuated up and down since that time.  We have been unable to taper his steroids despite efforts for the last 3-4 months.  We started rituximab therapy in October and he completed 4 doses.  His platelet count has been averaging between 99,000 to 105,000, but he has been reluctant to taper the prednisone with the fluctuating levels.  However, we will try decreasing to 30 mg daily.  2. Leukocytosis, secondary to high dose steroids.  He does not have symptoms of infection.    3. Hypertension, secondary to high-dose prednisone.  He is currently on a different medication.  He will continue to monitor this at home.      4. Hypokalemia, resolved at this time.  He continues oral supplement 20 meq TID.    5. Steroid induced diabetes.  He has been placed on metformin 500 mg twice daily and glipizide 5 mg daily.  6.  Elevated Epstein Barr titers.  This may be indicative of chronic fatigue syndrome, and I advised that he rest as needed.  We had a discussion about this diagnosis.  His sister has started him on zinc, lemon balm, and L lysine.  7.  Generalized weakness consistent with steroid myopathy.  This should improve with tapering prednisone.  8.  Sleep apnea, untreated.  I encouraged him to use his CPAP mask for better quality of life and to reduce his daytime sleepiness.   Plan: At this time, he will try decreasing the prednisone to 30 mg daily.  I will add a hemoglobin A1c as requested and call him with the result along with his chemistires.  He will continue with weekly labs.  We will see him back on February 23 with CBC and CMP for repeat evaluation.  The patient understands the plans discussed today and are in agreement with them.  They know to contact our office if he develops symptoms of infection or  other concerns prior to his next appointment.    I provided 20 minutes of face-to-face time during this this encounter and > 50% was spent counseling as documented under my assessment and plan.    Derwood Kaplan, MD [facility] [address] [place] [address] Dept: [phone] Dept Fax: [phone]   I, Rita Ohara, am acting as scribe for Derwood Kaplan, MD  I have reviewed this report as typed by the medical scribe, and it is complete and accurate.

## 2020-10-10 ENCOUNTER — Encounter: Payer: Self-pay | Admitting: Oncology

## 2020-10-10 ENCOUNTER — Inpatient Hospital Stay: Payer: 59

## 2020-10-10 ENCOUNTER — Other Ambulatory Visit: Payer: Self-pay | Admitting: Hematology and Oncology

## 2020-10-10 ENCOUNTER — Inpatient Hospital Stay (INDEPENDENT_AMBULATORY_CARE_PROVIDER_SITE_OTHER): Payer: 59 | Admitting: Oncology

## 2020-10-10 VITALS — BP 144/85 | HR 98 | Temp 97.7°F | Resp 18 | Ht 71.0 in | Wt 309.6 lb

## 2020-10-10 DIAGNOSIS — D693 Immune thrombocytopenic purpura: Secondary | ICD-10-CM

## 2020-10-10 LAB — BASIC METABOLIC PANEL
BUN: 21 (ref 4–21)
CO2: 31 — AB (ref 13–22)
Chloride: 97 — AB (ref 99–108)
Creatinine: 0.7 (ref 0.6–1.3)
Glucose: 108
Potassium: 3.5 (ref 3.4–5.3)
Sodium: 137 (ref 137–147)

## 2020-10-10 LAB — CBC: RBC: 4.98 (ref 3.87–5.11)

## 2020-10-10 LAB — CBC AND DIFFERENTIAL
HCT: 43 (ref 41–53)
Hemoglobin: 14.8 (ref 13.5–17.5)
Neutrophils Absolute: 12.32
Platelets: 99 — AB (ref 150–399)
WBC: 16

## 2020-10-10 LAB — HEPATIC FUNCTION PANEL
ALT: 30 (ref 10–40)
AST: 19 (ref 14–40)
Alkaline Phosphatase: 65 (ref 25–125)
Bilirubin, Total: 1

## 2020-10-10 LAB — COMPREHENSIVE METABOLIC PANEL
Albumin: 4.3 (ref 3.5–5.0)
Calcium: 9.1 (ref 8.7–10.7)

## 2020-10-18 ENCOUNTER — Other Ambulatory Visit: Payer: Self-pay | Admitting: Hematology and Oncology

## 2020-10-18 ENCOUNTER — Other Ambulatory Visit: Payer: Self-pay

## 2020-10-18 ENCOUNTER — Inpatient Hospital Stay: Payer: 59

## 2020-10-18 LAB — CBC
MCV: 87 (ref 80–94)
RBC: 4.72 (ref 3.87–5.11)

## 2020-10-18 LAB — BASIC METABOLIC PANEL
BUN: 19 (ref 4–21)
CO2: 32 — AB (ref 13–22)
Chloride: 99 (ref 99–108)
Creatinine: 0.6 (ref 0.6–1.3)
Glucose: 115
Potassium: 3.4 (ref 3.4–5.3)
Sodium: 139 (ref 137–147)

## 2020-10-18 LAB — HEPATIC FUNCTION PANEL
ALT: 31 (ref 10–40)
AST: 22 (ref 14–40)
Alkaline Phosphatase: 60 (ref 25–125)
Bilirubin, Total: 0.7

## 2020-10-18 LAB — COMPREHENSIVE METABOLIC PANEL
Albumin: 3.9 (ref 3.5–5.0)
Calcium: 8.9 (ref 8.7–10.7)

## 2020-10-18 LAB — CBC AND DIFFERENTIAL
HCT: 41 (ref 41–53)
Hemoglobin: 14 (ref 13.5–17.5)
Neutrophils Absolute: 10.42
Platelets: 88 — AB (ref 150–399)
WBC: 15.1

## 2020-10-22 NOTE — Progress Notes (Signed)
[facility]  [address] [place],  [place]  [address] [phone]  Clinic Day:  10/24/2020  Referring physician: [name], NP   This document serves as a record of services personally performed by [name], MD. It was created on their behalf by Curry,Lauren E, a trained medical scribe. The creation of this record is based on the scribe's personal observations and the provider's statements to them.  CHIEF COMPLAINT:  CC: Thrombocytopenia  Current Treatment:  Prednisone 30 mg daily   HISTORY OF PRESENT ILLNESS:  Mike Mike [name] is a 50 y.o. male with severe thrombocytopenia, felt to represent immune thrombocytopenic purpura.  He has had thrombocytopenia which has fluctuated up and down since June 2020.  Further laboratory evaluation did not reveal any other specific etiology for his thrombocytopenia.  He had mildly elevated liver transaminases.  Abdominal ultrasound revealed increased echogenicity of hepatic parenchyma suggesting hepatic steatosis, with probable fatty sparing adjacent to gallbladder fossa.  No other abnormality was seen.  We recommended bone marrow examination, but the Mike [name] and his mother declined.  The platelet count continued to fluctuate up and down, but as his platelet count remained 25,000 on June 7th, he was started on prednisone 20 mg 3 times daily.  His platelet count had increased to 73,000 on June 16th, so prednisone was continued at the same dose.  He had leukocytosis secondary to the high-dose prednisone.  He has had oral candidiasis on occasion.  His dose of prednisone was decreased from 60 mg to 50 mg daily in late August.  Mike Mike [name] titers were elevated, so this may be representative of chronic fatigue syndrome. We decreased his prednisone to 45 mg in late September and platelets remained stable at 97,000.  We were unable to taper him off the prednisone and so administered Rituximab weekly for 4 weeks in  October and he completed on November 17th.  We have now resumed a taper of his steroids.  His platelet count came up to 112,000 but has been slowly drifting down since that time.  We decreased the prednisone to 30 mg daily as of mid February and he increased his Lexapro to 10 mg daily.    INTERVAL HISTORY:  He is here for routine follow up and reports worsening edema of the lower extremities.  He has does get a low dose of diuretic with his blood pressure medication.  He also notes that he has been breathing heavy.  He continues prednisone 30 mg daily without difficulty.  His platelet count dropped to 88,000 last week, but has come back up to 96,000 today.  White count is stable at 15.1, and his hemoglobin is normal.  Chemistries are unremarkable except for a BUN of 23.  His  appetite is good, and his weight is stable since his last visit.  He denies fever, chills or other signs of infection.  He denies nausea, vomiting, bowel issues, or abdominal pain.  He denies sore throat, cough, dyspnea, or chest pain.  REVIEW OF SYSTEMS:  Review of Systems  Constitutional: Positive for fatigue. Negative for appetite change, chills, fever and unexpected weight change.  HENT:  Negative.   Eyes: Negative.   Respiratory: Negative for chest tightness, cough, hemoptysis, shortness of breath and wheezing.   Cardiovascular: Positive for leg swelling. Negative for chest pain and palpitations.  Gastrointestinal: Negative.  Negative for abdominal distention, abdominal pain, blood in stool, constipation, diarrhea, nausea and vomiting.  Endocrine: Negative.   Genitourinary: Negative.  Negative for difficulty urinating, dysuria, frequency and hematuria.   Musculoskeletal: Negative.  Negative for arthralgias, back pain, flank pain, gait problem and myalgias.  Skin: Negative.   Neurological: Negative.  Negative for dizziness, extremity weakness, gait problem, headaches, light-headedness, numbness, seizures and speech  difficulty.  Hematological: Negative.   Psychiatric/Behavioral: Negative for depression and sleep disturbance. The Mike [name] is nervous/anxious (health related).   All other systems reviewed and are negative.    VITALS:  Blood pressure (!) 147/77, pulse (!) 104, temperature 98.2 F (36.8 C), temperature source Oral, resp. rate 18, height _0  (1.803 m), weight (!) 310 lb (140.6 kg), SpO2 95 %.  Wt Readings from Last 3 Encounters:  10/24/20 (!) 310 lb (140.6 kg)  10/10/20 (!) 309 lb 9.6 oz (140.4 kg)  09/26/20 (!) 304 lb 6.4 oz (138.1 kg)    Body mass index is 43.24 kg/m.  Performance status (ECOG): 1 - Symptomatic but completely ambulatory  PHYSICAL EXAM:  Physical Exam Constitutional:      General: He is not in acute distress.    Appearance: Normal appearance. He is normal weight.  HENT:     Head: Normocephalic and atraumatic.  Eyes:     General: No scleral icterus.    Extraocular Movements: Extraocular movements intact.     Conjunctiva/sclera: Conjunctivae normal.     Pupils: Pupils are equal, round, and reactive to light.  Cardiovascular:     Rate and Rhythm: Regular rhythm. Tachycardia present.     Pulses: Normal pulses.     Heart sounds: Normal heart sounds. No murmur heard. No friction rub. No gallop.   Pulmonary:     Effort: Pulmonary effort is normal. No respiratory distress.     Breath sounds: Normal breath sounds.  Abdominal:     General: Bowel sounds are normal. There is no distension.     Palpations: Abdomen is soft. There is no hepatomegaly, splenomegaly or mass.     Tenderness: There is no abdominal tenderness.  Musculoskeletal:        General: Normal range of motion.     Cervical back: Normal range of motion and neck supple.     Right lower leg: 1+ Edema present.     Left lower leg: 1+ Edema present.  Lymphadenopathy:     Cervical: No cervical adenopathy.  Skin:    General: Skin is warm and dry.  Neurological:     General: No focal deficit present.      Mental Status: He is alert and oriented to person, place, and time. Mental status is at baseline.  Psychiatric:        Mood and Affect: Mood normal.        Behavior: Behavior normal.        Thought Content: Thought content normal.        Judgment: Judgment normal.     LABS:   CBC Latest Ref Rng & Units 10/18/2020 10/10/2020 10/04/2020  WBC - 15.1 16.0 15.5  Hemoglobin 13.5 - 17.5 14.0 14.8 14.1  Hematocrit 41 - 53 41 43 41  Platelets 150 - 399 88(A) 99(A) 102(A)   CMP Latest Ref Rng & Units 10/18/2020 10/10/2020 10/04/2020  BUN 4 - _1 Creatinine 0.6 - 1.3 0.6 0.7 0.6  Sodium 137 - 147 139 137 138  Potassium 3.4 - 5.3 3.4 3.5 3.6  Chloride 99 - 108 99 97(A) 100  CO2 13 - 22 32(A) 31(A) 31(A)  Calcium 8.7 - 10.7 8.9 9.1 9.4  Alkaline Phos 25 - 125 60 65 65  AST 14 - 40 _0 ALT 10 - 40 _1 STUDIES:  No results found.   Allergies: No Known Allergies  Current Medications: Current Outpatient Medications  Medication Sig Dispense Refill  . Accu-Chek FastClix Lancets MISC Apply topically daily.    Marland Kitchen ACCU-CHEK GUIDE test strip USE TO check blood sugar UP TO THREE TIMES DAILY    . amLODipine (NORVASC) 10 MG tablet Take 10 mg by mouth daily.    . Ascorbic Acid (VITAMIN C) 1000 MG tablet Take 1,000 mg by mouth daily.    . benazepril (LOTENSIN) 40 MG tablet Take 40 mg by mouth daily.    . Blood Glucose Monitoring Suppl (ACCU-CHEK GUIDE) w/Device KIT See admin instructions.    . cholecalciferol (VITAMIN D3) 25 MCG (1000 UNIT) tablet Take 1,000 Units by mouth daily.    Marland Kitchen escitalopram (LEXAPRO) 10 MG tablet Take 10 mg by mouth daily. STARTED ON 10MG AS OF YESTERDAY.    Marland Kitchen escitalopram (LEXAPRO) 5 MG tablet Take 5 mg by mouth daily.    . fexofenadine (ALLEGRA) 180 MG tablet Take 180 mg by mouth daily.    . fluconazole (DIFLUCAN) 200 MG tablet Take 200 mg by mouth daily.    Marland Kitchen glipiZIDE (GLUCOTROL XL) 10 MG 24 hr tablet Take 10 mg by mouth daily.    . indapamide  (LOZOL) 2.5 MG tablet Take 2.5 mg by mouth daily.    . metFORMIN (GLUCOPHAGE) 500 MG tablet Take 2,000 mg by mouth Nightly.    Marland Kitchen omeprazole (PRILOSEC) 20 MG capsule Take 20 mg by mouth 4 (four) times daily.    . potassium chloride SA (KLOR-CON) 20 MEQ tablet Take 20 mEq by mouth 3 (three) times daily.    . predniSONE (DELTASONE) 10 MG tablet Take 1 tablet (10 mg total) by mouth in the morning, at noon, and at bedtime.    . predniSONE (DELTASONE) 20 MG tablet Take 1 tablet (20 mg total) by mouth 2 (two) times daily with a meal. 60 tablet 2  . vitamin B-12 (CYANOCOBALAMIN) 1000 MCG tablet Take 1,000 mcg by mouth daily.    . vitamin k 100 MCG tablet Take 100 mcg by mouth daily.     No current facility-administered medications for this visit.     ASSESSMENT & PLAN:   Assessment:  1. Significant thrombocytopenia, which has been fluctuating up and down since June 2020.  This is felt to represent immune thrombocytopenic purpura.  We placed him on prednisone 20 mg three times daily in early June.  His platelet count initially significantly increased to 73,000 on June 16th, but has fluctuated up and down since that time.  We have been unable to taper his steroids despite efforts for the last 3-4 months.  We started rituximab therapy in October and he completed 4 doses.  His platelet count has been averaging between 99,000 to 105,000, but he has been reluctant to taper the prednisone with the fluctuating levels.  The prednisone was decreased to 30 mg daily, and his platelet count initially dropped to 88,000, but has come back up again.  2. Leukocytosis, secondary to high dose steroids.  He does not have symptoms of infection.    3. Hypertension, secondary to high-dose prednisone.  He is currently on a different medication.  He will continue to monitor this at home.      4. Hypokalemia, resolved at this time.  He  continues oral supplement 20 meq TID.    5. Steroid induced diabetes.  He has been placed  on metformin 500 mg twice daily and glipizide 5 mg daily.  6.  Elevated Epstein Barr titers.  This may be indicative of chronic fatigue syndrome, and I advised that he rest as needed.  We had a discussion about this diagnosis.  His sister has started him on zinc, lemon balm, and L lysine.  7.  Generalized weakness consistent with steroid myopathy.  This should improve with tapering prednisone.  8.  Sleep apnea, untreated.  I encouraged him to use his CPAP mask for better quality of life and to reduce his daytime sleepiness.   9.  Increasing edema, likely from his chronic steroids, but he may require increased diuretic.    10.  Chronic anxiety, mainly related to his health.  Plan: At this time, he will continue prednisone to 30 mg daily.  He will continue with weekly CBCs.  We will see him back in 2 weeks with CBC and CMP for repeat evaluation.  The Mike [name] understands the plans discussed today and are in agreement with them.  They know to contact our office if he develops symptoms of infection or other concerns prior to his next appointment.    I provided 20 minutes of face-to-face time during this this encounter and > 50% was spent counseling as documented under my assessment and plan.    Derwood Kaplan, MD [facility] [address] [place] [address] Dept: [phone] Dept Fax: [phone]   I, Rita Ohara, am acting as scribe for Derwood Kaplan, MD  I have reviewed this report as typed by the medical scribe, and it is complete and accurate.

## 2020-10-24 ENCOUNTER — Inpatient Hospital Stay: Payer: 59

## 2020-10-24 ENCOUNTER — Other Ambulatory Visit: Payer: Self-pay | Admitting: Oncology

## 2020-10-24 ENCOUNTER — Other Ambulatory Visit: Payer: Self-pay

## 2020-10-24 ENCOUNTER — Encounter: Payer: Self-pay | Admitting: Oncology

## 2020-10-24 ENCOUNTER — Telehealth: Payer: Self-pay | Admitting: Oncology

## 2020-10-24 ENCOUNTER — Other Ambulatory Visit: Payer: Self-pay | Admitting: Hematology and Oncology

## 2020-10-24 ENCOUNTER — Inpatient Hospital Stay (INDEPENDENT_AMBULATORY_CARE_PROVIDER_SITE_OTHER): Payer: 59 | Admitting: Oncology

## 2020-10-24 VITALS — BP 147/77 | HR 104 | Temp 98.2°F | Resp 18 | Ht 71.0 in | Wt 310.0 lb

## 2020-10-24 DIAGNOSIS — D693 Immune thrombocytopenic purpura: Secondary | ICD-10-CM | POA: Diagnosis not present

## 2020-10-24 LAB — BASIC METABOLIC PANEL
BUN: 23 — AB (ref 4–21)
CO2: 31 — AB (ref 13–22)
Chloride: 98 — AB (ref 99–108)
Creatinine: 0.7 (ref 0.6–1.3)
Glucose: 117
Potassium: 3.7 (ref 3.4–5.3)
Sodium: 139 (ref 137–147)

## 2020-10-24 LAB — COMPREHENSIVE METABOLIC PANEL
Albumin: 4.1 (ref 3.5–5.0)
Calcium: 9.1 (ref 8.7–10.7)

## 2020-10-24 LAB — CBC AND DIFFERENTIAL
HCT: 42 (ref 41–53)
Hemoglobin: 14.2 (ref 13.5–17.5)
Neutrophils Absolute: 11.17
Platelets: 96 — AB (ref 150–399)
WBC: 15.1

## 2020-10-24 LAB — HEPATIC FUNCTION PANEL
ALT: 29 (ref 10–40)
AST: 21 (ref 14–40)
Alkaline Phosphatase: 61 (ref 25–125)
Bilirubin, Total: 0.8

## 2020-10-24 LAB — CBC: RBC: 4.81 (ref 3.87–5.11)

## 2020-10-24 NOTE — Telephone Encounter (Signed)
Per 2/23 los next appt sched and given to patient 

## 2020-11-01 ENCOUNTER — Inpatient Hospital Stay: Payer: 59 | Attending: Oncology

## 2020-11-01 ENCOUNTER — Other Ambulatory Visit: Payer: Self-pay | Admitting: Hematology and Oncology

## 2020-11-01 ENCOUNTER — Telehealth: Payer: Self-pay

## 2020-11-01 ENCOUNTER — Other Ambulatory Visit: Payer: Self-pay

## 2020-11-01 LAB — BASIC METABOLIC PANEL
BUN: 20 (ref 4–21)
CO2: 34 — AB (ref 13–22)
Chloride: 99 (ref 99–108)
Creatinine: 0.6 (ref 0.6–1.3)
Glucose: 102
Potassium: 3.3 — AB (ref 3.4–5.3)
Sodium: 139 (ref 137–147)

## 2020-11-01 LAB — HEPATIC FUNCTION PANEL
ALT: 28 (ref 10–40)
AST: 19 (ref 14–40)
Alkaline Phosphatase: 64 (ref 25–125)
Bilirubin, Total: 0.8

## 2020-11-01 LAB — CBC AND DIFFERENTIAL
HCT: 42 (ref 41–53)
Hemoglobin: 14.2 (ref 13.5–17.5)
Neutrophils Absolute: 10.66
Platelets: 89 — AB (ref 150–399)
WBC: 14.4

## 2020-11-01 LAB — CBC
MCV: 87 (ref 80–94)
RBC: 4.78 (ref 3.87–5.11)

## 2020-11-01 LAB — COMPREHENSIVE METABOLIC PANEL
Albumin: 4.2 (ref 3.5–5.0)
Calcium: 8.7 (ref 8.7–10.7)

## 2020-11-01 NOTE — Telephone Encounter (Signed)
-----   Message from Dellia Beckwith, MD sent at 11/01/2020  1:31 PM EST ----- Regarding: call pt Tell him platelets 89,000, I rec he stay on prednisone 30 mg daily Rest looks good except low potassium, rec continue to get in diet

## 2020-11-06 NOTE — Progress Notes (Signed)
[facility]  [address] [place],  [place]  [address] [phone]  Clinic Day:  11/08/2020  Referring physician: [name], NP   CHIEF COMPLAINT:  CC: Immune thrombocytopenic purpura  Current Treatment:  Prednisone 41m daily   HISTORY OF PRESENT ILLNESS:  Mike [name] a 50y.o. male with severe thrombocytopenia, felt to represent immune thrombocytopenic purpura.  He has had thrombocytopenia, which has fluctuated up and down since June 2020.  Further laboratory evaluation did not reveal any other specific etiology for his thrombocytopenia.  He had mildly elevated liver transaminases.  Abdominal ultrasound revealed increased echogenicity of hepatic parenchyma suggesting hepatic steatosis, with probable fatty sparing adjacent to gallbladder fossa.  No other abnormality was seen.  We recommended bone marrow examination, but the patient and his mother declined.  The platelet count continued to fluctuate up and down, but as his platelet count remained 25,000 in June 2021, he was started on prednisone 20 mg 3 times daily, with a good response.  He has had leukocytosis secondary to the high-dose prednisone.  He also has had oral candidiasis on occasion. ERandell Patienttiters were elevated, so this may be representative of chronic fatigue syndrome. We decreased his prednisone to 45 mg in late September and platelets remained stable at 97,000.  Since we were unable to taper him off the prednisone he was treated with rituximab weekly for 4 weeks in October and November.  We have been slowly tapering the prednisone.  His platelet count has remained above 75,000 despite decreasing the prednisone down to 382mdaily in February, but he has been hesitant to decrease the prednisone further. 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  He has also had hypokalemia resolved with oral supplementation.  More recently, he  developed lower extremity edema and blurry vision felt to be secondary to prednisone.   INTERVAL HISTORY:  Mike [name] here today for repeat clinical assessment. He states he continues prednisone 302maily. He continues to report bilateral lower extremity edema and blurry vision, which are stable and attributed to prednisone. However, he reports new matting of his eyes in the morning with redness and irritation of both eyes, but no pain. He denies fevers or chills. He reports persistent fatigue, which he attributes to rituximab.  He denies progressive weakness. He falls asleep just sitting on the couch and occasionally while talking on the telephone, which is unchanged.  He does not sleep well at night. We have attributed the fatigue and excessive daytime sleepiness to his untreated sleep apnea.  He denies pain. His appetite is good. His weight has decreased 2 pounds over last 2 weeks. He states he is taking his potassium chloride 3 times daily.  He continues to work full-time 12 hour shifts on his feet.  REVIEW OF SYSTEMS:  Review of Systems  Constitutional: Negative for appetite change, chills, fatigue and fever.  HENT:   Negative for lump/mass, mouth sores and sore throat.   Eyes: Positive for eye problems (redness and irriation of both eyes with matting in am).  Respiratory: Negative for cough and shortness of breath.   Cardiovascular: Positive for leg swelling (bilateral legs, bilateral legs). Negative for chest pain and palpitations.  Gastrointestinal: Negative for abdominal pain, blood in stool, constipation, diarrhea, nausea and vomiting.  Genitourinary: Negative for difficulty urinating, dysuria, frequency and hematuria.   Musculoskeletal: Negative for arthralgias and myalgias.  Skin: Negative for itching, rash and wound.  Neurological: Negative for dizziness, extremity weakness, headaches, light-headedness and numbness.  Hematological: Negative for adenopathy. Does not bruise/bleed easily.   Psychiatric/Behavioral: Positive for sleep disturbance. Negative for depression. The patient is not nervous/anxious.     VITALS:  Blood pressure (!) 164/85, pulse 96, temperature 97.9 F (36.6 C), resp. rate 18, height '5\' 11"'  (1.803 m), weight (!) 308 lb 14.4 oz (140.1 kg), SpO2 96 %.  Wt Readings from Last 3 Encounters:  11/07/20 (!) 308 lb 14.4 oz (140.1 kg)  10/24/20 (!) 310 lb (140.6 kg)  10/10/20 (!) 309 lb 9.6 oz (140.4 kg)    Body mass index is 43.08 kg/m.  Performance status (ECOG): 1 - Symptomatic but completely ambulatory  PHYSICAL EXAM:  Physical Exam Vitals and nursing note reviewed.  Constitutional:      General: He is not in acute distress.    Appearance: Normal appearance. He is normal weight. He is not toxic-appearing.  HENT:     Head: Normocephalic and atraumatic.     Mouth/Throat:     Mouth: Mucous membranes are moist.     Pharynx: Oropharynx is clear. No oropharyngeal exudate or posterior oropharyngeal erythema.  Eyes:     General: No scleral icterus.       Right eye: No discharge.        Left eye: No discharge.     Extraocular Movements: Extraocular movements intact.     Conjunctiva/sclera:     Right eye: Right conjunctiva is injected. No exudate.    Left eye: Left conjunctiva is injected. No exudate.    Pupils: Pupils are equal, round, and reactive to light.     Comments: Erythema of the bilateral conjunctivae and sclera  Cardiovascular:     Rate and Rhythm: Normal rate and regular rhythm.     Heart sounds: Normal heart sounds. No murmur heard. No friction rub. No gallop.   Pulmonary:     Effort: Pulmonary effort is normal.     Breath sounds: Normal breath sounds. No wheezing, rhonchi or rales.  Chest:  Breasts:     Right: No axillary adenopathy or supraclavicular adenopathy.     Left: No axillary adenopathy or supraclavicular adenopathy.    Abdominal:     General: Bowel sounds are normal. There is no distension.     Palpations: Abdomen is  soft. There is no hepatomegaly, splenomegaly or mass.     Tenderness: There is no abdominal tenderness.  Musculoskeletal:        General: Normal range of motion.     Cervical back: Normal range of motion and neck supple. No tenderness.     Right lower leg: No edema.     Left lower leg: No edema.  Lymphadenopathy:     Cervical: No cervical adenopathy.     Upper Body:     Right upper body: No supraclavicular or axillary adenopathy.     Left upper body: No supraclavicular or axillary adenopathy.     Lower Body: No right inguinal adenopathy. No left inguinal adenopathy.  Skin:    General: Skin is warm and dry.     Coloration: Skin is not jaundiced.     Findings: No rash.  Neurological:     Mental Status: He is alert and oriented to person, place, and time.     Cranial Nerves: No cranial nerve deficit.  Psychiatric:        Mood and Affect: Mood normal.        Behavior: Behavior normal.  Thought Content: Thought content normal.    LABS:   CBC Latest Ref Rng & Units 11/07/2020 11/01/2020 10/24/2020  WBC - 15.3 14.4 15.1  Hemoglobin 13.5 - 17.5 14.4 14.2 14.2  Hematocrit 41 - 53 42 42 42  Platelets 150 - 399 98(A) 89(A) 96(A)   CMP Latest Ref Rng & Units 11/07/2020 11/01/2020 10/24/2020  BUN 4 - '21 17 20 ' 23(A)  Creatinine 0.6 - 1.3 0.6 0.6 0.7  Sodium 137 - 147 139 139 139  Potassium 3.4 - 5.3 3.3(A) 3.3(A) 3.7  Chloride 99 - 108 100 99 98(A)  CO2 13 - 22 33(A) 34(A) 31(A)  Calcium 8.7 - 10.7 8.6(A) 8.7 9.1  Alkaline Phos 25 - 125 61 64 61  AST 14 - 40 '19 19 21  ' ALT 10 - 40 '28 28 29     ' No results found for: CEA1 / No results found for: CEA1 No results found for: PSA1 No results found for: UZH460 No results found for: CAN125  No results found for: TOTALPROTELP, ALBUMINELP, A1GS, A2GS, BETS, BETA2SER, GAMS, MSPIKE, SPEI No results found for: TIBC, FERRITIN, IRONPCTSAT No results found for: LDH  STUDIES:  No results found.    HISTORY:   Past Medical History:   Diagnosis Date  . Allergy   . GERD (gastroesophageal reflux disease)   . Hypertension   . Immune thrombocytopenic purpura ([place])   . Sleep apnea    wears a mouth piece  . Thrombocytopenia, unspecified ([place])     History reviewed. No pertinent surgical history.  Family History  Problem Relation Age of Onset  . Heart attack Father   . Thrombosis Maternal Aunt        fatal  . Thrombosis Maternal Uncle        fatal  . Heart attack Paternal Uncle   . Breast cancer Maternal Grandmother        82s  . Thrombosis Maternal Grandmother        fatal  . Heart attack Paternal Grandfather     Social History:  reports that he has never smoked. He has quit using smokeless tobacco. He reports previous alcohol use. He reports previous drug use.The patient is accompanied by his sister today.  Allergies: No Known Allergies  Current Medications: Current Outpatient Medications  Medication Sig Dispense Refill  . erythromycin ophthalmic ointment Place 1 application into both eyes at bedtime. For 7 days 3.5 g 0  . Accu-Chek FastClix Lancets MISC Apply topically daily.    Marland Kitchen ACCU-CHEK GUIDE test strip USE TO check blood sugar UP TO THREE TIMES DAILY    . amLODipine (NORVASC) 10 MG tablet Take 10 mg by mouth daily.    . Ascorbic Acid (VITAMIN C) 1000 MG tablet Take 1,000 mg by mouth daily.    . benazepril (LOTENSIN) 40 MG tablet Take 40 mg by mouth daily.    . Blood Glucose Monitoring Suppl (ACCU-CHEK GUIDE) w/Device KIT See admin instructions.    . cholecalciferol (VITAMIN D3) 25 MCG (1000 UNIT) tablet Take 1,000 Units by mouth daily.    Marland Kitchen escitalopram (LEXAPRO) 10 MG tablet Take 10 mg by mouth daily. STARTED ON 10MG AS OF YESTERDAY.    Marland Kitchen escitalopram (LEXAPRO) 5 MG tablet Take 5 mg by mouth daily.    . fexofenadine (ALLEGRA) 180 MG tablet Take 180 mg by mouth daily.    . fluconazole (DIFLUCAN) 200 MG tablet Take 200 mg by mouth daily.    Marland Kitchen glipiZIDE (GLUCOTROL XL) 10 MG 24  hr tablet Take 10 mg by  mouth daily.    . indapamide (LOZOL) 2.5 MG tablet Take 2.5 mg by mouth daily.    . metFORMIN (GLUCOPHAGE) 500 MG tablet Take 2,000 mg by mouth Nightly.    Marland Kitchen omeprazole (PRILOSEC) 20 MG capsule Take 20 mg by mouth 4 (four) times daily.    . potassium chloride SA (KLOR-CON) 20 MEQ tablet Take 20 mEq by mouth 3 (three) times daily.    . predniSONE (DELTASONE) 10 MG tablet Take 1 tablet (10 mg total) by mouth in the morning, at noon, and at bedtime.    . predniSONE (DELTASONE) 20 MG tablet Take 1 tablet (20 mg total) by mouth 2 (two) times daily with a meal. 60 tablet 2  . vitamin B-12 (CYANOCOBALAMIN) 1000 MCG tablet Take 1,000 mcg by mouth daily.    . vitamin k 100 MCG tablet Take 100 mcg by mouth daily.     No current facility-administered medications for this visit.     ASSESSMENT & PLAN:   Assessment:  1. Significant thrombocytopenia, which has been fluctuating up and down since June 2020.  This is felt to represent immune thrombocytopenic purpura.  We placed him on prednisone 20 mg three times daily in early June.    We were unable to taper his steroids, so he underwent rituximab therapy weekly for 4 weeks in October.  His platelet count has remained above 75,000, but he has been reluctant to taper the prednisone, as each time his platelets will drop slightly before coming back up.  His platelets are in good range today, so we will decrease his prednisone to 25 mg daily.  2. Leukocytosis, secondary to high dose steroids.   He does have evidence of conjunctivitis today.   3. Hypertension, secondary to high-dose prednisone. He continues to monitor this at home.      4.  Mild hypokalemia despite potassium chloride 20 meq 3 times daily.  We will continue to monitor this.  5. Steroid induced diabetes, for which he continues metformin 500 mg twice daily and glipizide 5 mg daily.  6.  Elevated Epstein Barr titers.  This may be indicative of chronic fatigue syndrome, and I advised that he  rest as needed.  We had a discussion about this diagnosis.  His sister has him taking zinc, lemon balm, and L lysine.  7.  Generalized weakness consistent with steroid myopathy, which is slowly improving.  8.  Sleep apnea, untreated. We have encouraged him to use his CPAP mask for better quality of life and to reduce his daytime sleepiness.   9.  Increasing edema, likely from his chronic steroids, but he may require increased diuretic.    10.  Chronic anxiety, mainly related to his health.  11.  Bilateral conjunctivitis, he is at risk for bacterial infection due to the cjronic steroid treatment, so I will treat him with erythromycin ointment daily at bedtime.  Plan:   We will have him decrease his prednisone to 25 mg daily and repeat a CBC in 1 week.  I will treat him with erythromycin ointment for bilateral conjunctivitis.  We will plan to see him in 2 weeks with a CBC and comprehensive metabolic panel.  The patient and his sister understand the plans discussed today and are in agreement with them.  They know to contact our office if they develops concern prior to his next appointment.     Marvia Pickles, PA-C

## 2020-11-07 ENCOUNTER — Inpatient Hospital Stay: Payer: 59

## 2020-11-07 ENCOUNTER — Other Ambulatory Visit: Payer: Self-pay

## 2020-11-07 ENCOUNTER — Encounter: Payer: Self-pay | Admitting: Hematology and Oncology

## 2020-11-07 ENCOUNTER — Inpatient Hospital Stay (INDEPENDENT_AMBULATORY_CARE_PROVIDER_SITE_OTHER): Payer: 59 | Admitting: Hematology and Oncology

## 2020-11-07 VITALS — BP 164/85 | HR 96 | Temp 97.9°F | Resp 18 | Ht 71.0 in | Wt 308.9 lb

## 2020-11-07 DIAGNOSIS — D693 Immune thrombocytopenic purpura: Secondary | ICD-10-CM

## 2020-11-07 LAB — COMPREHENSIVE METABOLIC PANEL
Albumin: 4.2 (ref 3.5–5.0)
Calcium: 8.6 — AB (ref 8.7–10.7)

## 2020-11-07 LAB — BASIC METABOLIC PANEL
BUN: 17 (ref 4–21)
CO2: 33 — AB (ref 13–22)
Chloride: 100 (ref 99–108)
Creatinine: 0.6 (ref 0.6–1.3)
Glucose: 125
Potassium: 3.3 — AB (ref 3.4–5.3)
Sodium: 139 (ref 137–147)

## 2020-11-07 LAB — CBC AND DIFFERENTIAL
HCT: 42 (ref 41–53)
Hemoglobin: 14.4 (ref 13.5–17.5)
Neutrophils Absolute: 11.48
Platelets: 98 — AB (ref 150–399)
WBC: 15.3

## 2020-11-07 LAB — HEPATIC FUNCTION PANEL
ALT: 28 (ref 10–40)
AST: 19 (ref 14–40)
Alkaline Phosphatase: 61 (ref 25–125)
Bilirubin, Total: 0.7

## 2020-11-07 LAB — CBC: RBC: 4.91 (ref 3.87–5.11)

## 2020-11-07 MED ORDER — ERYTHROMYCIN 5 MG/GM OP OINT: 1.0000 "application " | TOPICAL_OINTMENT | Freq: Every day | OPHTHALMIC | 0 refills | Status: DC

## 2020-11-07 NOTE — Progress Notes (Signed)
bil lower extremity edema; blurry vision; pressure behind eyes; feels like his eyes are popping out of his head

## 2020-11-15 ENCOUNTER — Inpatient Hospital Stay: Payer: 59

## 2020-11-15 ENCOUNTER — Other Ambulatory Visit: Payer: Self-pay | Admitting: Hematology and Oncology

## 2020-11-15 DIAGNOSIS — D693 Immune thrombocytopenic purpura: Secondary | ICD-10-CM

## 2020-11-15 LAB — BASIC METABOLIC PANEL
BUN: 18 (ref 4–21)
CO2: 31 — AB (ref 13–22)
Chloride: 99 (ref 99–108)
Creatinine: 0.7 (ref 0.6–1.3)
Glucose: 129
Potassium: 3.4 (ref 3.4–5.3)
Sodium: 138 (ref 137–147)

## 2020-11-15 LAB — HEPATIC FUNCTION PANEL
ALT: 27 (ref 10–40)
AST: 20 (ref 14–40)
Alkaline Phosphatase: 59 (ref 25–125)
Bilirubin, Total: 1.1

## 2020-11-15 LAB — CBC
MCV: 86 (ref 80–94)
RBC: 4.79 (ref 3.87–5.11)

## 2020-11-15 LAB — CBC AND DIFFERENTIAL
HCT: 41 (ref 41–53)
Hemoglobin: 13.8 (ref 13.5–17.5)
Neutrophils Absolute: 8.78
Platelets: 91 — AB (ref 150–399)
WBC: 13.1

## 2020-11-15 LAB — COMPREHENSIVE METABOLIC PANEL
Albumin: 4.1 (ref 3.5–5.0)
Calcium: 8.6 — AB (ref 8.7–10.7)

## 2020-11-21 ENCOUNTER — Inpatient Hospital Stay: Payer: 59

## 2020-11-21 ENCOUNTER — Other Ambulatory Visit: Payer: Self-pay

## 2020-11-21 ENCOUNTER — Encounter: Payer: Self-pay | Admitting: Hematology and Oncology

## 2020-11-21 ENCOUNTER — Inpatient Hospital Stay (INDEPENDENT_AMBULATORY_CARE_PROVIDER_SITE_OTHER): Payer: 59 | Admitting: Hematology and Oncology

## 2020-11-21 ENCOUNTER — Other Ambulatory Visit: Payer: Self-pay | Admitting: Hematology and Oncology

## 2020-11-21 VITALS — BP 157/92 | HR 98 | Temp 98.3°F | Resp 20 | Ht 71.0 in | Wt 308.0 lb

## 2020-11-21 DIAGNOSIS — D693 Immune thrombocytopenic purpura: Secondary | ICD-10-CM | POA: Diagnosis not present

## 2020-11-21 LAB — COMPREHENSIVE METABOLIC PANEL
Albumin: 4.3 (ref 3.5–5.0)
Calcium: 8.8 (ref 8.7–10.7)

## 2020-11-21 LAB — BASIC METABOLIC PANEL
BUN: 17 (ref 4–21)
CO2: 32 — AB (ref 13–22)
Chloride: 98 — AB (ref 99–108)
Creatinine: 0.7 (ref 0.6–1.3)
Glucose: 103
Potassium: 3.5 (ref 3.4–5.3)
Sodium: 137 (ref 137–147)

## 2020-11-21 LAB — CBC: RBC: 4.94 (ref 3.87–5.11)

## 2020-11-21 LAB — HEPATIC FUNCTION PANEL
ALT: 27 (ref 10–40)
AST: 22 (ref 14–40)
Alkaline Phosphatase: 61 (ref 25–125)
Bilirubin, Total: 1

## 2020-11-21 LAB — CBC AND DIFFERENTIAL
HCT: 42 (ref 41–53)
Hemoglobin: 14.3 (ref 13.5–17.5)
Neutrophils Absolute: 9.45
Platelets: 84 — AB (ref 150–399)
WBC: 13.5

## 2020-11-21 NOTE — Progress Notes (Signed)
[facility]  [address] [place],  [place]  [address] [phone]  Clinic Day:  11/21/2020  Referring physician: [name], NP   CHIEF COMPLAINT:  CC: Immune thrombocytopenic purpura  Current Treatment:  Prednisone 86m daily   HISTORY OF PRESENT ILLNESS:  MJaedyn Lardis a 50y.o. male with severe thrombocytopenia, felt to represent immune thrombocytopenic purpura.  He has had thrombocytopenia, which has fluctuated up and down since June 2020.  Further laboratory evaluation did not reveal any other specific etiology for his thrombocytopenia.  He had mildly elevated liver transaminases.  Abdominal ultrasound revealed increased echogenicity of hepatic parenchyma suggesting hepatic steatosis, with probable fatty sparing adjacent to gallbladder fossa.  No other abnormality was seen.  We recommended bone marrow examination, but the patient and his mother declined.  The platelet count continued to fluctuate up and down, but as his platelet count remained 25,000 in June 2021, he was started on prednisone 20 mg 3 times daily, with a good response.  He has had leukocytosis secondary to the high-dose prednisone.  He also has had oral candidiasis on occasion. ERandell Patienttiters were elevated, so this may be representative of chronic fatigue syndrome. We decreased his prednisone to 45 mg in late September and platelets remained stable at 97,000.  Since we were unable to taper him off the prednisone he was treated with rituximab weekly for 4 weeks in October and November.  We have been slowly tapering the prednisone.  His platelet count has remained above 75,000 despite decreasing the prednisone down to 354mdaily in February, but he has been hesitant to decrease the prednisone further. 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  He has also had hypokalemia resolved with oral supplementation.  More recently, he  developed lower extremity edema and blurry vision felt to be secondary to prednisone. At his visit on March 9th, his platelets were up to 98,000, so we decreased the prednisone to 2570maily.  He had redness, irritation and morning discharge from his eyes, so was treated for conjunctivitis with erythromycin ointment.  INTERVAL HISTORY:  MicDrae here today for repeat clinical assessment. He states he continues prednisone 61m4mily. He states his eye irritation and discharge resolved with treatement. He continues to report bilateral lower extremity edema and intermitten blurry vision, which are stable and attributed to prednisone. He denies fevers or chills. He reports persistent fatigue, which he attributes to rituximab.  He denies progressive weakness. He falls asleep just sitting on the couch and occasionally while talking on the telephone, which is unchanged.  He does not sleep well at night. We have attributed the fatigue and excessive daytime sleepiness to his untreated sleep apnea.  He denies pain. His appetite is good. His weight has decreased 2 pounds over last 2 weeks. He states he is taking his potassium chloride 3 times daily.  He continues to work full-time 12 hour shifts on his feet.  REVIEW OF SYSTEMS:  Review of Systems  Constitutional: Negative for appetite change, chills, fatigue and fever.  HENT:   Negative for lump/mass, mouth sores and sore throat.   Eyes: Negative for eye problems (redness and irriation of both eyes with matting in am).  Respiratory: Negative for cough and shortness of breath.   Cardiovascular: Positive for leg swelling (bilateral legs, bilateral legs). Negative for chest pain and palpitations.  Gastrointestinal: Negative for abdominal pain, blood in stool, constipation, diarrhea, nausea and  vomiting.  Genitourinary: Negative for difficulty urinating, dysuria, frequency and hematuria.   Musculoskeletal: Negative for arthralgias and myalgias.  Skin: Negative for  itching, rash and wound.  Neurological: Negative for dizziness, extremity weakness, headaches, light-headedness and numbness.  Hematological: Negative for adenopathy. Does not bruise/bleed easily.  Psychiatric/Behavioral: Positive for sleep disturbance. Negative for depression. The patient is not nervous/anxious.     VITALS:  Blood pressure (!) 165/81, pulse 98, temperature 98.3 F (36.8 C), temperature source Oral, resp. rate 20, height '5\' 11"'  (1.803 m), weight (!) 308 lb (139.7 kg).  Wt Readings from Last 3 Encounters:  11/21/20 (!) 308 lb (139.7 kg)  11/07/20 (!) 308 lb 14.4 oz (140.1 kg)  10/24/20 (!) 310 lb (140.6 kg)    Body mass index is 42.96 kg/m.  Performance status (ECOG): 1 - Symptomatic but completely ambulatory  PHYSICAL EXAM:  Physical Exam Vitals and nursing note reviewed.  Constitutional:      General: He is not in acute distress.    Appearance: Normal appearance. He is normal weight. He is not toxic-appearing.  HENT:     Head: Normocephalic and atraumatic.  Eyes:     General: No scleral icterus.       Right eye: No discharge.        Left eye: No discharge.     Extraocular Movements: Extraocular movements intact.     Conjunctiva/sclera: Conjunctivae normal.     Right eye: Right conjunctiva is not injected. No exudate.    Left eye: Left conjunctiva is not injected. No exudate.    Pupils: Pupils are equal, round, and reactive to light.  Chest:  Breasts:     Right: No axillary adenopathy or supraclavicular adenopathy.     Left: No axillary adenopathy or supraclavicular adenopathy.    Abdominal:     General: There is no distension.     Palpations: Abdomen is soft. There is no hepatomegaly, splenomegaly or mass.     Tenderness: There is no abdominal tenderness.  Musculoskeletal:        General: Normal range of motion.     Cervical back: Normal range of motion and neck supple. No tenderness.     Right lower leg: No edema.     Left lower leg: No edema.   Lymphadenopathy:     Cervical: No cervical adenopathy.     Upper Body:     Right upper body: No supraclavicular or axillary adenopathy.     Left upper body: No supraclavicular or axillary adenopathy.     Lower Body: No right inguinal adenopathy. No left inguinal adenopathy.  Skin:    General: Skin is warm and dry.     Coloration: Skin is not jaundiced.     Findings: No rash.  Neurological:     Mental Status: He is alert and oriented to person, place, and time.     Cranial Nerves: No cranial nerve deficit.  Psychiatric:        Mood and Affect: Mood normal.        Behavior: Behavior normal.        Thought Content: Thought content normal.    LABS:   CBC Latest Ref Rng & Units 11/21/2020 11/15/2020 11/07/2020  WBC - 13.5 13.1 15.3  Hemoglobin 13.5 - 17.5 14.3 13.8 14.4  Hematocrit 41 - 53 42 41 42  Platelets 150 - 399 84(A) 91(A) 98(A)   CMP Latest Ref Rng & Units 11/21/2020 11/15/2020 11/07/2020  BUN 4 - '21 17 18 ' 17  Creatinine 0.6 - 1.3 0.7 0.7 0.6  Sodium 137 - 147 137 138 139  Potassium 3.4 - 5.3 3.5 3.4 3.3(A)  Chloride 99 - 108 98(A) 99 100  CO2 13 - 22 32(A) 31(A) 33(A)  Calcium 8.7 - 10.7 8.8 8.6(A) 8.6(A)  Alkaline Phos 25 - 125 61 59 61  AST 14 - 40 '22 20 19  ' ALT 10 - 40 '27 27 28     ' No results found for: CEA1 / No results found for: CEA1 No results found for: PSA1 No results found for: SXQ820 No results found for: CAN125  No results found for: TOTALPROTELP, ALBUMINELP, A1GS, A2GS, BETS, BETA2SER, GAMS, MSPIKE, SPEI No results found for: TIBC, FERRITIN, IRONPCTSAT No results found for: LDH  STUDIES:  No results found.    HISTORY:   Past Medical History:  Diagnosis Date  . Allergy   . GERD (gastroesophageal reflux disease)   . Hypertension   . Immune thrombocytopenic purpura ([place])   . Sleep apnea    wears a mouth piece  . Thrombocytopenia, unspecified ([facility] [place])     No past surgical history on file.  Family History  Problem Relation Age of Onset  .  Heart attack Father   . Thrombosis Maternal Aunt        fatal  . Thrombosis Maternal Uncle        fatal  . Heart attack Paternal Uncle   . Breast cancer Maternal Grandmother        60s  . Thrombosis Maternal Grandmother        fatal  . Heart attack Paternal Grandfather     Social History:  reports that he has never smoked. He has quit using smokeless tobacco. He reports previous alcohol use. He reports previous drug use.The patient is accompanied by his sister today.  Allergies: No Known Allergies  Current Medications: Current Outpatient Medications  Medication Sig Dispense Refill  . Accu-Chek FastClix Lancets MISC Apply topically daily.    Marland Kitchen ACCU-CHEK GUIDE test strip USE TO check blood sugar UP TO THREE TIMES DAILY    . amLODipine (NORVASC) 10 MG tablet Take 10 mg by mouth daily.    . Ascorbic Acid (VITAMIN C) 1000 MG tablet Take 1,000 mg by mouth daily.    . benazepril (LOTENSIN) 40 MG tablet Take 40 mg by mouth daily.    . Blood Glucose Monitoring Suppl (ACCU-CHEK GUIDE) w/Device KIT See admin instructions.    . cholecalciferol (VITAMIN D3) 25 MCG (1000 UNIT) tablet Take 1,000 Units by mouth daily.    Marland Kitchen erythromycin ophthalmic ointment Place 1 application into both eyes at bedtime. For 7 days 3.5 g 0  . escitalopram (LEXAPRO) 10 MG tablet Take 10 mg by mouth daily. STARTED ON 10MG AS OF YESTERDAY.    Marland Kitchen escitalopram (LEXAPRO) 5 MG tablet Take 5 mg by mouth daily.    . fexofenadine (ALLEGRA) 180 MG tablet Take 180 mg by mouth daily.    . fluconazole (DIFLUCAN) 200 MG tablet Take 200 mg by mouth daily.    Marland Kitchen glipiZIDE (GLUCOTROL XL) 10 MG 24 hr tablet Take 10 mg by mouth daily.    . indapamide (LOZOL) 2.5 MG tablet Take 2.5 mg by mouth daily.    . metFORMIN (GLUCOPHAGE) 500 MG tablet Take 2,000 mg by mouth Nightly.    Marland Kitchen omeprazole (PRILOSEC) 20 MG capsule Take 20 mg by mouth 4 (four) times daily.    . potassium chloride SA (KLOR-CON) 20 MEQ tablet Take 20 mEq  by mouth 3 (three)  times daily.    . predniSONE (DELTASONE) 10 MG tablet Take 1 tablet (10 mg total) by mouth in the morning, at noon, and at bedtime.    . predniSONE (DELTASONE) 20 MG tablet Take 1 tablet (20 mg total) by mouth 2 (two) times daily with a meal. 60 tablet 2  . vitamin B-12 (CYANOCOBALAMIN) 1000 MCG tablet Take 1,000 mcg by mouth daily.    . vitamin k 100 MCG tablet Take 100 mcg by mouth daily.     No current facility-administered medications for this visit.     ASSESSMENT & PLAN:   Assessment:  1. Significant thrombocytopenia, which has been fluctuating up and down since June 2020.  This is felt to represent immune thrombocytopenic purpura.  We placed him on prednisone 20 mg three times daily in early June.    We were unable to taper his steroids, so he underwent rituximab therapy weekly for 4 weeks in October.  His platelet count has remained above 75,000, but he has been reluctant to taper the prednisone, as each time his platelets will drop slightly before coming back up.  His platelets remain above 75,000, but due to the decrease, he would like to continue prednisone to 25 mg daily.  2. Leukocytosis, secondary to high dose steroids.    3. Hypertension, secondary to high-dose prednisone. He continues to monitor this at home.      4.  Mild hypokalemia despite potassium chloride 20 meq 3 times daily. His potassium is borderline normal today, so he will continue the same dose of potassium.  5. Steroid induced diabetes, for which he continues metformin 500 mg twice daily and glipizide 5 mg daily.  His blood sugar is in good range today.  6.  Elevated Epstein Barr titers.  This may be indicative of chronic fatigue syndrome, and I advised that he rest as needed.  We had a discussion about this diagnosis.  His sister has him taking zinc, lemon balm, and L lysine.  7.  Generalized weakness consistent with steroid myopathy, which is stable.  8.  Sleep apnea, untreated. I encouraged him to use  his CPAP mask for better quality of life and to reduce his daytime sleepiness.   9.  Stable edema, likely from his chronic steroids.I would like to hold off on starting a diuretic due to his borderline potassium despite supplementation.    10.  Chronic anxiety, mainly related to his health.  11.  Bilateral conjunctivitis, resolved.  Plan:     I will have him continue prednisone to 25 mg daily and repeat a CBC in 1 week. We will plan to see him in 2 weeks with a CBC and comprehensive metabolic panel.  The patient and his sister understand the plans discussed today and are in agreement with them.  They know to contact our office if they develops concern prior to his next appointment.     Marvia Pickles, PA-C

## 2020-11-29 ENCOUNTER — Inpatient Hospital Stay: Payer: 59

## 2020-11-29 ENCOUNTER — Other Ambulatory Visit: Payer: Self-pay

## 2020-11-29 ENCOUNTER — Other Ambulatory Visit: Payer: Self-pay | Admitting: Hematology and Oncology

## 2020-11-29 DIAGNOSIS — D693 Immune thrombocytopenic purpura: Secondary | ICD-10-CM

## 2020-11-29 LAB — BASIC METABOLIC PANEL
BUN: 14 (ref 4–21)
CO2: 31 — AB (ref 13–22)
Chloride: 99 (ref 99–108)
Creatinine: 0.6 (ref 0.6–1.3)
Glucose: 107
Potassium: 3.2 — AB (ref 3.4–5.3)
Sodium: 139 (ref 137–147)

## 2020-11-29 LAB — HEPATIC FUNCTION PANEL
ALT: 29 (ref 10–40)
AST: 20 (ref 14–40)
Alkaline Phosphatase: 61 (ref 25–125)
Bilirubin, Total: 1

## 2020-11-29 LAB — CBC: RBC: 4.9 (ref 3.87–5.11)

## 2020-11-29 LAB — CBC AND DIFFERENTIAL
HCT: 42 (ref 41–53)
Hemoglobin: 13.9 (ref 13.5–17.5)
Neutrophils Absolute: 11.97
Platelets: 96 — AB (ref 150–399)
WBC: 14.6

## 2020-11-29 LAB — COMPREHENSIVE METABOLIC PANEL
Albumin: 4.3 (ref 3.5–5.0)
Calcium: 8.7 (ref 8.7–10.7)

## 2020-12-03 NOTE — Progress Notes (Signed)
[facility]  [address] [place],  [place]  [address] [phone]  Clinic Day:  12/05/2020  Referring physician: [name], NP  This document serves as a record of services personally performed by [name], MD. It was created on their behalf by Curry,Lauren E, a trained medical scribe. The creation of this record is based on the scribe's personal observations and the provider's statements to them.  CHIEF COMPLAINT:  CC: Immune thrombocytopenic purpura  Current Treatment:  Prednisone 44m daily   HISTORY OF PRESENT ILLNESS:  MRyota [name] a 50y.o. male with severe thrombocytopenia, felt to represent immune thrombocytopenic purpura.  He has had thrombocytopenia, which has fluctuated up and down since June 2020.  Further laboratory evaluation did not reveal any other specific etiology for his thrombocytopenia.  He had mildly elevated liver transaminases.  Abdominal ultrasound revealed increased echogenicity of hepatic parenchyma suggesting hepatic steatosis, with probable fatty sparing adjacent to gallbladder fossa.  No other abnormality was seen.  We recommended bone marrow examination, but the patient and his mother declined.  The platelet count continued to fluctuate up and down, but as his platelet count remained 25,000 in June 2021, he was started on prednisone 20 mg 3 times daily, with a good response.  He has had leukocytosis secondary to the high-dose prednisone.  He also has had oral candidiasis on occasion. ERandell Patienttiters were elevated, so this may be representative of chronic fatigue syndrome. We decreased his prednisone to 45 mg in late September and platelets remained stable at 97,000.  Since we were unable to taper him off the prednisone he was treated with rituximab weekly for 4 weeks in October and November.  We have been slowly tapering the prednisone.  His platelet count has remained above 75,000 despite decreasing the  prednisone down to 325mdaily in February, but he has been hesitant to decrease the prednisone further. 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  He has also had hypokalemia resolved with oral supplementation.  More recently, he developed lower extremity edema and blurry vision felt to be secondary to prednisone. At his visit on March 9th, his platelets were up to 98,000, so we decreased the prednisone to 2554maily.  He had redness, irritation and morning discharge from his eyes, so was treated for conjunctivitis with erythromycin ointment.  INTERVAL HISTORY:  Mike [name] here for routine follow up and states that he has been doing well other than lower extremity edema.  He continues prednisone 25 mg daily.  His platelet count has mildly increased from 96,000 to 100,000, his white count is elevated at 16.8 secondary to steroids, and his hemoglobin is normal.  Chemistries are unremarkable.  Potassium is borderline normal at 3.5, but improved.  His  appetite is good, and his weight is stable since his last visit.  He denies fever, chills or other signs of infection.  He denies nausea, vomiting, bowel issues, or abdominal pain.  He denies sore throat, cough, dyspnea, or chest pain.  REVIEW OF SYSTEMS:  Review of Systems  Constitutional: Negative.  Negative for appetite change, chills, fatigue, fever and unexpected weight change.  HENT:  Negative.   Eyes: Negative.   Respiratory: Negative.  Negative for chest tightness, cough, hemoptysis, shortness of breath and wheezing.   Cardiovascular: Positive for leg swelling. Negative for chest pain and palpitations.  Gastrointestinal: Negative.  Negative for abdominal distention, abdominal pain, blood in stool, constipation,  diarrhea, nausea and vomiting.  Endocrine: Negative.   Genitourinary: Negative.  Negative for difficulty urinating, dysuria, frequency and hematuria.   Musculoskeletal: Negative.   Negative for arthralgias, back pain, flank pain, gait problem and myalgias.  Skin: Negative.   Neurological: Negative.  Negative for dizziness, extremity weakness, gait problem, headaches, light-headedness, numbness, seizures and speech difficulty.  Hematological: Negative.   Psychiatric/Behavioral: Negative.  Negative for depression and sleep disturbance. The patient is not nervous/anxious.   All other systems reviewed and are negative.   VITALS:  Blood pressure (!) 142/88, pulse (!) 106, temperature 97.9 F (36.6 C), temperature source Oral, resp. rate 18, height '5\' 11"'  (1.803 m), weight (!) 307 lb 14.4 oz (139.7 kg), SpO2 96 %.  Wt Readings from Last 3 Encounters:  12/05/20 (!) 307 lb 14.4 oz (139.7 kg)  11/21/20 (!) 308 lb (139.7 kg)  11/07/20 (!) 308 lb 14.4 oz (140.1 kg)    Body mass index is 42.94 kg/m.  Performance status (ECOG): 1 - Symptomatic but completely ambulatory  PHYSICAL EXAM:  Physical Exam Constitutional:      General: He is not in acute distress.    Appearance: Normal appearance. He is normal weight.  HENT:     Head: Normocephalic and atraumatic.  Eyes:     General: No scleral icterus.    Extraocular Movements: Extraocular movements intact.     Conjunctiva/sclera: Conjunctivae normal.     Pupils: Pupils are equal, round, and reactive to light.  Cardiovascular:     Rate and Rhythm: Regular rhythm. Tachycardia present.     Pulses: Normal pulses.     Heart sounds: Normal heart sounds. No murmur heard. No friction rub. No gallop.   Pulmonary:     Effort: Pulmonary effort is normal. No respiratory distress.     Breath sounds: Normal breath sounds.  Abdominal:     General: Bowel sounds are normal. There is no distension.     Palpations: Abdomen is soft. There is no hepatomegaly, splenomegaly or mass.     Tenderness: There is no abdominal tenderness.  Musculoskeletal:        General: Normal range of motion.     Cervical back: Normal range of motion and neck  supple.     Right lower leg: 1+ Edema present.     Left lower leg: 1+ Edema present.  Lymphadenopathy:     Cervical: No cervical adenopathy.  Skin:    General: Skin is warm and dry.  Neurological:     General: No focal deficit present.     Mental Status: He is alert and oriented to person, place, and time. Mental status is at baseline.  Psychiatric:        Mood and Affect: Mood normal.        Behavior: Behavior normal.        Thought Content: Thought content normal.        Judgment: Judgment normal.    LABS:   CBC Latest Ref Rng & Units 12/05/2020 11/29/2020 11/21/2020  WBC - 16.8 14.6 13.5  Hemoglobin 13.5 - 17.5 14.1 13.9 14.3  Hematocrit 41 - 53 43 42 42  Platelets 150 - 399 100(A) 96(A) 84(A)   CMP Latest Ref Rng & Units 12/05/2020 11/29/2020 11/21/2020  BUN 4 - '21 19 14 17  ' Creatinine 0.6 - 1.3 0.6 0.6 0.7  Sodium 137 - 147 137 139 137  Potassium 3.4 - 5.3 3.5 3.2(A) 3.5  Chloride 99 - 108 99 99 98(A)  CO2 13 -  22 31(A) 31(A) 32(A)  Calcium 8.7 - 10.7 9.1 8.7 8.8  Alkaline Phos 25 - 125 62 61 61  AST 14 - 40 '19 20 22  ' ALT 10 - 40 '28 29 27    ' STUDIES:  No results found.    HISTORY:   Allergies: No Known Allergies  Current Medications: Current Outpatient Medications  Medication Sig Dispense Refill  . Accu-Chek FastClix Lancets MISC Apply topically daily.    Marland Kitchen ACCU-CHEK GUIDE test strip USE TO check blood sugar UP TO THREE TIMES DAILY    . amLODipine (NORVASC) 10 MG tablet Take 10 mg by mouth daily.    . Ascorbic Acid (VITAMIN C) 1000 MG tablet Take 1,000 mg by mouth daily.    . benazepril (LOTENSIN) 40 MG tablet Take 40 mg by mouth daily.    . Blood Glucose Monitoring Suppl (ACCU-CHEK GUIDE) w/Device KIT See admin instructions.    . cholecalciferol (VITAMIN D3) 25 MCG (1000 UNIT) tablet Take 1,000 Units by mouth daily.    Marland Kitchen erythromycin ophthalmic ointment Place 1 application into both eyes at bedtime. For 7 days 3.5 g 0  . escitalopram (LEXAPRO) 10 MG tablet Take  10 mg by mouth daily. STARTED ON 10MG AS OF YESTERDAY.    Marland Kitchen escitalopram (LEXAPRO) 5 MG tablet Take 5 mg by mouth daily.    . fexofenadine (ALLEGRA) 180 MG tablet Take 180 mg by mouth daily.    . fluconazole (DIFLUCAN) 200 MG tablet Take 200 mg by mouth daily.    Marland Kitchen glipiZIDE (GLUCOTROL XL) 10 MG 24 hr tablet Take 10 mg by mouth daily.    . indapamide (LOZOL) 2.5 MG tablet Take 2.5 mg by mouth daily.    . metFORMIN (GLUCOPHAGE) 500 MG tablet Take 2,000 mg by mouth Nightly.    Marland Kitchen omeprazole (PRILOSEC) 20 MG capsule Take 20 mg by mouth 4 (four) times daily.    . potassium chloride SA (KLOR-CON) 20 MEQ tablet Take 20 mEq by mouth 3 (three) times daily.    . predniSONE (DELTASONE) 10 MG tablet Take 1 tablet (10 mg total) by mouth in the morning, at noon, and at bedtime.    . predniSONE (DELTASONE) 20 MG tablet Take 1 tablet (20 mg total) by mouth 2 (two) times daily with a meal. 60 tablet 2  . vitamin B-12 (CYANOCOBALAMIN) 1000 MCG tablet Take 1,000 mcg by mouth daily.    . vitamin k 100 MCG tablet Take 100 mcg by mouth daily.     No current facility-administered medications for this visit.     ASSESSMENT & PLAN:   Assessment:  1. Significant thrombocytopenia, which has been fluctuating up and down since June 2020.  This is felt to represent immune thrombocytopenic purpura.  We placed him on prednisone 20 mg three times daily in early June.    We were unable to taper his steroids, so he underwent rituximab therapy weekly for 4 weeks in October.  He is not a good candidate for splenectomy unless absolutely necessary.  His platelet count has remained above 75,000, but he has been reluctant to taper the prednisone, as each time his platelets will drop slightly before coming back up.  His platelets have improved to 100,000 and so we will decrease the prednisone to 20 mg daily.  2. Leukocytosis, secondary to high dose steroids.    3. Hypertension, secondary to high-dose prednisone. He continues to  monitor this at home.      4.  Mild hypokalemia despite potassium  chloride 20 meq 3 times daily. His potassium is borderline normal today, so he will continue the same dose of potassium.  5. Steroid induced diabetes, for which he continues metformin 500 mg twice daily and glipizide 5 mg daily.  His blood sugar is in good range today.  6.  Elevated Epstein Barr titers.  This may be indicative of chronic fatigue syndrome, and I advised that he rest as needed.  We had a discussion about this diagnosis.  His sister has him taking zinc, lemon balm, and L lysine.  7.  Generalized weakness consistent with steroid myopathy, which is stable.  8.  Sleep apnea, untreated. I encouraged him to use his CPAP mask for better quality of life and to reduce his daytime sleepiness.   9.  Stable edema, likely from his chronic steroids.I would like to hold off on starting a diuretic due to his borderline potassium despite supplementation.    10.  Chronic anxiety, mainly related to his health.  Plan:      As his platelet counts have continued to improve, we will decrease the prednisone to 20 mg daily.  We will also discontinue weekly labs.  We will plan to see him in 2 weeks with a CBC and comprehensive metabolic panel.  We will now continue appointments every 2 weeks and he would like to keep appointments on Wednesday to coordinate with his work schedule.  The patient and his sister understand the plans discussed today and are in agreement with them.  They know to contact our office if they develops concern prior to his next appointment.    I, Rita Ohara, am acting as scribe for Derwood Kaplan, MD  I have reviewed this report as typed by the medical scribe, and it is complete and accurate.  Hermina Barters

## 2020-12-05 ENCOUNTER — Other Ambulatory Visit: Payer: Self-pay

## 2020-12-05 ENCOUNTER — Other Ambulatory Visit: Payer: Self-pay | Admitting: Hematology and Oncology

## 2020-12-05 ENCOUNTER — Encounter: Payer: Self-pay | Admitting: Oncology

## 2020-12-05 ENCOUNTER — Inpatient Hospital Stay: Payer: 59 | Attending: Oncology | Admitting: Oncology

## 2020-12-05 ENCOUNTER — Telehealth: Payer: Self-pay | Admitting: Oncology

## 2020-12-05 ENCOUNTER — Inpatient Hospital Stay: Payer: 59

## 2020-12-05 ENCOUNTER — Other Ambulatory Visit: Payer: Self-pay | Admitting: Oncology

## 2020-12-05 VITALS — BP 142/88 | HR 106 | Temp 97.9°F | Resp 18 | Ht 71.0 in | Wt 307.9 lb

## 2020-12-05 DIAGNOSIS — D693 Immune thrombocytopenic purpura: Secondary | ICD-10-CM | POA: Diagnosis not present

## 2020-12-05 LAB — CBC
MCV: 86 (ref 80–94)
RBC: 5 (ref 3.87–5.11)

## 2020-12-05 LAB — BASIC METABOLIC PANEL
BUN: 19 (ref 4–21)
CO2: 31 — AB (ref 13–22)
Chloride: 99 (ref 99–108)
Creatinine: 0.6 (ref 0.6–1.3)
Glucose: 111
Potassium: 3.5 (ref 3.4–5.3)
Sodium: 137 (ref 137–147)

## 2020-12-05 LAB — HEPATIC FUNCTION PANEL
ALT: 28 (ref 10–40)
AST: 19 (ref 14–40)
Alkaline Phosphatase: 62 (ref 25–125)
Bilirubin, Total: 0.8

## 2020-12-05 LAB — CBC AND DIFFERENTIAL
HCT: 43 (ref 41–53)
Hemoglobin: 14.1 (ref 13.5–17.5)
Neutrophils Absolute: 12.26
Platelets: 100 — AB (ref 150–399)
WBC: 16.8

## 2020-12-05 LAB — COMPREHENSIVE METABOLIC PANEL
Albumin: 4.3 (ref 3.5–5.0)
Calcium: 9.1 (ref 8.7–10.7)

## 2020-12-05 NOTE — Telephone Encounter (Signed)
Per 4/6 los next appt scheduled and given to patient 

## 2020-12-19 ENCOUNTER — Encounter: Payer: Self-pay | Admitting: Hematology and Oncology

## 2020-12-19 ENCOUNTER — Other Ambulatory Visit: Payer: Self-pay

## 2020-12-19 ENCOUNTER — Inpatient Hospital Stay (INDEPENDENT_AMBULATORY_CARE_PROVIDER_SITE_OTHER): Payer: 59 | Admitting: Hematology and Oncology

## 2020-12-19 ENCOUNTER — Inpatient Hospital Stay: Payer: 59

## 2020-12-19 VITALS — BP 152/80 | HR 90 | Temp 97.9°F | Resp 18 | Ht 71.0 in | Wt 306.0 lb

## 2020-12-19 DIAGNOSIS — D693 Immune thrombocytopenic purpura: Secondary | ICD-10-CM

## 2020-12-19 LAB — CBC
MCV: 86 (ref 80–94)
RBC: 5.11 (ref 3.87–5.11)

## 2020-12-19 LAB — BASIC METABOLIC PANEL
BUN: 16 (ref 4–21)
CO2: 31 — AB (ref 13–22)
Chloride: 99 (ref 99–108)
Creatinine: 0.7 (ref 0.6–1.3)
Glucose: 110
Potassium: 3.7 (ref 3.4–5.3)
Sodium: 139 (ref 137–147)

## 2020-12-19 LAB — CBC AND DIFFERENTIAL
HCT: 44 (ref 41–53)
Hemoglobin: 14.2 (ref 13.5–17.5)
Neutrophils Absolute: 10.72
Platelets: 98 — AB (ref 150–399)
WBC: 15.1

## 2020-12-19 LAB — HEPATIC FUNCTION PANEL
ALT: 29 (ref 10–40)
AST: 22 (ref 14–40)
Alkaline Phosphatase: 62 (ref 25–125)
Bilirubin, Total: 1

## 2020-12-19 LAB — COMPREHENSIVE METABOLIC PANEL
Albumin: 4.5 (ref 3.5–5.0)
Calcium: 9.2 (ref 8.7–10.7)

## 2020-12-19 NOTE — Progress Notes (Signed)
[facility]  [address] [place],  [place]  [address] [phone]  Clinic Day:  12/19/2020  Referring physician: [name], NP   CHIEF COMPLAINT:  CC:  Immune thrombocytopenic purpura  Current Treatment:   Prednisone 20 mg daily   HISTORY OF PRESENT ILLNESS:  Mike [name] is a 50 y.o. male with a history of severe thrombocytopenia, felt to represent immune thrombocytopenic purpura. Mike [name] has had thrombocytopenia, which has fluctuated up and down since June 2020. Further laboratory evaluation did not reveal any other specific etiology for Mike [name] thrombocytopenia. Mike [name] had mildly elevated liver transaminases. Abdominal ultrasound revealed increased echogenicity of hepatic parenchyma suggesting hepatic steatosis, with probable fatty sparing adjacent to gallbladder fossa. No other abnormality was seen. We recommended bone marrow examination, but the patient and Mike [name] mother declined. The platelet count continued to fluctuate up and down, but as Mike [name] platelet count remained 25,000 in June 2021, Mike [name] was started on prednisone 20 mg 3 times daily, with a good response. Mike [name] has had leukocytosis secondary to the high-dose prednisone. Mike [name] also has had oral candidiasis on occasion. [name] titers were elevated, so this may be representative of chronic fatigue syndrome. We decreased Mike [name] prednisone to 45 mg in late September and platelets remained stable at 97,000. Since we were unable to taper Mike [name] off the prednisone Mike [name] was treated with rituximab weekly for 4 weeks in October and November. We have been slowly tapering the prednisone. Mike [name] platelet count has remained above 75,000 despite decreasing the prednisone down to 47m daily in February, but Mike [name] has been hesitant to decrease the prednisone further. Mike [name] has had complications of hypertension, steroid induced diabetes and steroid induced myopathy.  Mike [name] has also had hypokalemia resolved with oral supplementation.   More recently, Mike [name] developed lower extremity edema and blurry vision felt to be secondary to prednisone. At Mike [name] visit on March 9th, Mike [name] platelets were up to 98,000, so we decreased the prednisone to 260mdaily.  Mike [name] had redness, irritation and morning discharge from Mike [name] eyes, so was treated for conjunctivitis with erythromycin ointment.  INTERVAL HISTORY:  Mike [name] here today for repeat clinical assessment and is doing fairly well. Mike [name] continues to report fatigue. Mike [name] states Mike [name] lower extremity edema is improved with compression hose.  Mike [name] has some pain in Mike [name] legs which is stable. Mike [name] denies abnormal bleeding or bruising. Mike [name] denies fevers or chills. Mike [name] denies pain. Mike [name] appetite is good. Mike [name] weight has been stable.  Mike [name] remains hesitant to taper Mike [name] prednisone further.  REVIEW OF SYSTEMS:  Review of Systems  Constitutional: Negative for appetite change, chills, fatigue, fever and unexpected weight change.  HENT:   Negative for lump/mass, mouth sores and sore throat.   Respiratory: Positive for shortness of breath (Chronic, mild dyspnea with exertion which is stable). Negative for cough.   Cardiovascular: Positive for leg swelling (Improved with compression hose). Negative for chest pain.  Gastrointestinal: Negative for abdominal pain, constipation, diarrhea, nausea and vomiting.  Genitourinary: Negative for difficulty urinating, dysuria, frequency and hematuria.   Musculoskeletal: Negative for arthralgias, back pain and myalgias.  Skin: Negative for itching, rash and wound.  Neurological: Negative for dizziness, extremity weakness, headaches, light-headedness and numbness.  Hematological: Negative for adenopathy.  Psychiatric/Behavioral: Negative for depression and sleep disturbance. The patient is not nervous/anxious.      VITALS:  Blood pressure (!) 152/80, pulse 90, temperature 97.9 F (36.6 C), temperature source Oral, resp. rate 18, height '5\' 11"'  (1.803 m),  weight (!) 306 lb (138.8 kg), SpO2 98  %.  Wt Readings from Last 3 Encounters:  12/19/20 (!) 306 lb (138.8 kg)  12/05/20 (!) 307 lb 14.4 oz (139.7 kg)  11/21/20 (!) 308 lb (139.7 kg)    Body mass index is 42.68 kg/m.  Performance status (ECOG): 1 - Symptomatic but completely ambulatory  PHYSICAL EXAM:  Physical Exam Vitals and nursing note reviewed.  Constitutional:      General: Mike [name] is not in acute distress.    Appearance: Normal appearance. Mike [name] is normal weight.  HENT:     Head: Normocephalic and atraumatic.     Mouth/Throat:     Mouth: Mucous membranes are moist.     Pharynx: Oropharynx is clear. No oropharyngeal exudate or posterior oropharyngeal erythema.  Eyes:     General: No scleral icterus.    Extraocular Movements: Extraocular movements intact.     Conjunctiva/sclera: Conjunctivae normal.     Pupils: Pupils are equal, round, and reactive to light.  Cardiovascular:     Rate and Rhythm: Normal rate and regular rhythm.     Heart sounds: Normal heart sounds. No murmur heard. No friction rub. No gallop.   Pulmonary:     Effort: Pulmonary effort is normal.     Breath sounds: Normal breath sounds. No wheezing, rhonchi or rales.  Chest:  Breasts:     Right: No axillary adenopathy or supraclavicular adenopathy.     Left: No axillary adenopathy or supraclavicular adenopathy.    Abdominal:     General: Bowel sounds are normal. There is no distension.     Palpations: Abdomen is soft. There is no hepatomegaly, splenomegaly or mass.     Tenderness: There is no abdominal tenderness.  Musculoskeletal:        General: Normal range of motion.     Cervical back: Normal range of motion and neck supple. No tenderness.     Right lower leg: No edema.     Left lower leg: No edema.  Lymphadenopathy:     Cervical: No cervical adenopathy.     Upper Body:     Right upper body: No supraclavicular or axillary adenopathy.     Left upper body: No supraclavicular or axillary adenopathy.     Lower Body: No right inguinal  adenopathy. No left inguinal adenopathy.  Skin:    General: Skin is warm and dry.     Coloration: Skin is not jaundiced.     Findings: No rash.  Neurological:     Mental Status: Mike [name] is alert and oriented to person, place, and time.     Cranial Nerves: No cranial nerve deficit.  Psychiatric:        Mood and Affect: Mood normal.        Behavior: Behavior normal.        Thought Content: Thought content normal.    LABS:   CBC Latest Ref Rng & Units 12/19/2020 12/05/2020 11/29/2020  WBC - 15.1 16.8 14.6  Hemoglobin 13.5 - 17.5 14.2 14.1 13.9  Hematocrit 41 - 53 44 43 42  Platelets 150 - 399 98(A) 100(A) 96(A)   CMP Latest Ref Rng & Units 12/19/2020 12/05/2020 11/29/2020  BUN 4 - '21 16 19 14  ' Creatinine 0.6 - 1.3 0.7 0.6 0.6  Sodium 137 - 147 139 137 139  Potassium 3.4 - 5.3 3.7 3.5 3.2(A)  Chloride 99 - 108 99 99 99  CO2 13 - 22 31(A) 31(A) 31(A)  Calcium 8.7 - 10.7 9.2 9.1  8.7  Alkaline Phos 25 - 125 62 62 61  AST 14 - 40 '22 19 20  ' ALT 10 - 40 '29 28 29     ' No results found for: CEA1 / No results found for: CEA1 No results found for: PSA1 No results found for: HAL937 No results found for: CAN125  No results found for: TOTALPROTELP, ALBUMINELP, A1GS, A2GS, BETS, BETA2SER, GAMS, MSPIKE, SPEI No results found for: TIBC, FERRITIN, IRONPCTSAT No results found for: LDH  STUDIES:  No results found.    HISTORY:   Past Medical History:  Diagnosis Date  . Allergy   . GERD (gastroesophageal reflux disease)   . Hypertension   . Immune thrombocytopenic purpura ([place])   . Sleep apnea    wears a mouth piece  . Thrombocytopenia, unspecified ([place])     History reviewed. No pertinent surgical history.  Family History  Problem Relation Age of Onset  . Heart attack Father   . Thrombosis Maternal Aunt        fatal  . Thrombosis Maternal Uncle        fatal  . Heart attack Paternal Uncle   . Breast cancer Maternal Grandmother        70s  . Thrombosis Maternal Grandmother         fatal  . Heart attack Paternal Grandfather     Social History:  reports that Mike [name] has never smoked. Mike [name] has quit using smokeless tobacco. Mike [name] reports previous alcohol use. Mike [name] reports previous drug use.The patient is alone today.  Allergies: No Known Allergies  Current Medications: Current Outpatient Medications  Medication Sig Dispense Refill  . Accu-Chek FastClix Lancets MISC Apply topically daily.    Marland Kitchen ACCU-CHEK GUIDE test strip USE TO check blood sugar UP TO THREE TIMES DAILY    . amLODipine (NORVASC) 10 MG tablet Take 10 mg by mouth daily.    . Ascorbic Acid (VITAMIN C) 1000 MG tablet Take 1,000 mg by mouth daily.    . benazepril (LOTENSIN) 40 MG tablet Take 40 mg by mouth daily.    . Blood Glucose Monitoring Suppl (ACCU-CHEK GUIDE) w/Device KIT See admin instructions.    . cholecalciferol (VITAMIN D3) 25 MCG (1000 UNIT) tablet Take 1,000 Units by mouth daily.    Marland Kitchen erythromycin ophthalmic ointment Place 1 application into both eyes at bedtime. For 7 days 3.5 g 0  . escitalopram (LEXAPRO) 10 MG tablet Take 10 mg by mouth daily. STARTED ON 10MG AS OF YESTERDAY.    Marland Kitchen escitalopram (LEXAPRO) 5 MG tablet Take 5 mg by mouth daily.    . fexofenadine (ALLEGRA) 180 MG tablet Take 180 mg by mouth daily.    . fluconazole (DIFLUCAN) 200 MG tablet Take 200 mg by mouth daily.    Marland Kitchen glipiZIDE (GLUCOTROL XL) 10 MG 24 hr tablet Take 10 mg by mouth daily.    . indapamide (LOZOL) 2.5 MG tablet Take 2.5 mg by mouth daily.    . metFORMIN (GLUCOPHAGE) 500 MG tablet Take 2,000 mg by mouth Nightly.    Marland Kitchen omeprazole (PRILOSEC) 20 MG capsule Take 20 mg by mouth 4 (four) times daily.    . potassium chloride SA (KLOR-CON) 20 MEQ tablet Take 20 mEq by mouth 3 (three) times daily.    . predniSONE (DELTASONE) 20 MG tablet Take 1 tablet (20 mg total) by mouth 2 (two) times daily with a meal. 60 tablet 2  . predniSONE (DELTASONE) 20 MG tablet Take 20 mg by mouth daily with breakfast.    .  vitamin B-12 (CYANOCOBALAMIN)  1000 MCG tablet Take 1,000 mcg by mouth daily.    . vitamin k 100 MCG tablet Take 100 mcg by mouth daily.     No current facility-administered medications for this visit.     ASSESSMENT & PLAN:   Assessment:  1. Significant thrombocytopenia, which has been fluctuating up and down since June 2020. This is felt to represent immune thrombocytopenic purpura. We placed Mike [name] on prednisone 20 mg three times daily in early June.  We were unable to taper Mike [name] steroids, so Mike [name] underwent rituximab therapy weekly for 4 weeks in October with a partial response.  Mike [name] is not a good candidate for splenectomy unless absolutely necessary. Mike [name] platelet count has remained above 75,000, but Mike [name] has been reluctant to taper the prednisone, as each time Mike [name] platelets will drop slightly before coming back up.  Mike [name] platelets are stable, so we will decrease the prednisone to 15 mg daily.  2. Leukocytosis, secondary to high dose steroids.   3. Hypertension, secondary to high-dose prednisone. Mike [name] continues to monitor this at home.   4.  Mild hypokalemia despite potassium chloride 20 meq 3 times daily. Mike [name] potassium is borderline normal today, so Mike [name] will continue the same dose of potassium.  5. Steroid induced diabetes, for which Mike [name] continues metformin 500 mg twice daily and glipizide 5 mg daily.  Mike [name] blood sugar is in good range today.  6. Elevated Epstein Barr titers. This may be indicative of chronic fatigue syndrome, and I advised that Mike [name] rest as needed. We had a discussion about this diagnosis. Mike [name] sister has Mike [name] taking zinc, lemon balm, and L lysine.  7. Generalized weakness consistent with steroid myopathy, which is stable.  8. Sleep apnea, untreated. I encouraged Mike [name] to use Mike [name] CPAP mask for better quality of life and to reduce Mike [name] daytime sleepiness.   9.   Lower extremity edema, felt to be due to chronic steroid use.  This is improved with compression hose.  10. Chronic anxiety, mainly  related to Mike [name] health.  Plan:   As Mike [name] platelet count remains stable, I will have Mike [name] decrease the prednisone to 15 mg daily. We will continue appointments every 2 weeks plan to see Mike [name] in 2 weeks with a CBC and comprehensive metabolic panel. The patient understands the plans discussed today and is in agreement with them.  Mike [name] knows to contact our office if Mike [name] develops concerns prior to Mike [name] next appointment.     Marvia Pickles, PA-C

## 2021-01-02 ENCOUNTER — Inpatient Hospital Stay: Payer: 59

## 2021-01-02 ENCOUNTER — Inpatient Hospital Stay: Payer: 59 | Admitting: Hematology and Oncology

## 2021-01-10 ENCOUNTER — Inpatient Hospital Stay: Payer: 59 | Attending: Oncology

## 2021-01-10 ENCOUNTER — Inpatient Hospital Stay (INDEPENDENT_AMBULATORY_CARE_PROVIDER_SITE_OTHER): Payer: 59 | Admitting: Hematology and Oncology

## 2021-01-10 ENCOUNTER — Other Ambulatory Visit: Payer: Self-pay

## 2021-01-10 ENCOUNTER — Encounter: Payer: Self-pay | Admitting: Hematology and Oncology

## 2021-01-10 VITALS — BP 131/70 | HR 91 | Temp 98.4°F | Resp 18 | Ht 71.0 in | Wt 301.7 lb

## 2021-01-10 DIAGNOSIS — D693 Immune thrombocytopenic purpura: Secondary | ICD-10-CM

## 2021-01-10 LAB — HEPATIC FUNCTION PANEL
ALT: 36 (ref 10–40)
AST: 19 (ref 14–40)
Alkaline Phosphatase: 65 (ref 25–125)
Bilirubin, Total: 0.6

## 2021-01-10 LAB — BASIC METABOLIC PANEL
BUN: 16 (ref 4–21)
CO2: 32 — AB (ref 13–22)
Chloride: 99 (ref 99–108)
Creatinine: 0.6 (ref 0.6–1.3)
Glucose: 100
Potassium: 3.6 (ref 3.4–5.3)
Sodium: 141 (ref 137–147)

## 2021-01-10 LAB — CBC AND DIFFERENTIAL
HCT: 40 — AB (ref 41–53)
Hemoglobin: 13.3 — AB (ref 13.5–17.5)
Neutrophils Absolute: 9.11
Platelets: 179 (ref 150–399)
WBC: 13.6

## 2021-01-10 LAB — COMPREHENSIVE METABOLIC PANEL
Albumin: 4.2 (ref 3.5–5.0)
Calcium: 8.5 — AB (ref 8.7–10.7)

## 2021-01-10 LAB — CBC: RBC: 4.74 (ref 3.87–5.11)

## 2021-01-10 MED ORDER — ERYTHROMYCIN 5 MG/GM OP OINT: 1.0000 "application " | TOPICAL_OINTMENT | Freq: Every day | OPHTHALMIC | 0 refills | Status: DC

## 2021-01-10 MED ORDER — PREDNISONE 5 MG PO TABS: 5.0000 mg | ORAL_TABLET | Freq: Two times a day (BID) | ORAL | 1 refills | Status: DC

## 2021-01-10 NOTE — Progress Notes (Signed)
[facility]  [address] [place],  [place]  [address] [phone]  Clinic Day:  01/10/2021  Referring physician: [name], NP   CHIEF COMPLAINT:  CC:  Immune thrombocytopenic purpura  Current Treatment:   Prednisone 15 mg daily   HISTORY OF PRESENT ILLNESS:  Emerson Schreifels is a 50 y.o. male with a history of severe thrombocytopenia, felt to represent immune thrombocytopenic purpura. He has had thrombocytopenia, which has fluctuated up and down since June 2020. Further laboratory evaluation did not reveal any other specific etiology for his thrombocytopenia. He had mildly elevated liver transaminases. Abdominal ultrasound revealed increased echogenicity of hepatic parenchyma suggesting hepatic steatosis, with probable fatty sparing adjacent to gallbladder fossa. No other abnormality was seen. We recommended bone marrow examination, but the patient and his mother declined. The platelet count continued to fluctuate up and down, but as his platelet count remained 25,000 in June 2021, he was started on prednisone 20 mg 3 times daily, with a good response. He has had leukocytosis secondary to the high-dose prednisone. He also has had oral candidiasis on occasion. [name] titers were elevated, so this may be representative of chronic fatigue syndrome. We decreased his prednisone to 45 mg in late September and platelets remained stable at 97,000. Since we were unable to taper him off the prednisone he was treated with rituximab weekly for 4 weeks in October and November. We have been slowly tapering the prednisone. His platelet count has remained above 75,000 despite decreasing the prednisone down to 29m daily in February, but he has been hesitant to decrease the prednisone further. 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  He has also had hypokalemia resolved with oral supplementation.   More recently, he developed lower extremity edema and blurry vision felt to be secondary to prednisone. At his visit on March 9th, his platelets were up to 98,000, so we decreased the prednisone to 25 mg daily.  He had redness, irritation and morning discharge from his eyes, so was treated for conjunctivitis with erythromycin ointment.  On April 6th, his platelets were stable at 100,000, so we decreased prednisone to 20 mg daily.  His platelets remain stable at 98,000 on April 20th, so we reduced the prednisone to 15 mg daily.  INTERVAL HISTORY:  [place]ois here today for repeat clinical assessment and continues to do fairly well. He had a diarrheal illness last week, so missed his appointment.He has been doing well with the prednisone taper at this time. He states his symptoms resolved after 3 days, but he did not eat or drink well during that time. He continues to report fatigue. He states his lower extremity edema is increased during the day.  He has pain in his legs with the swelling, which is stable. He denies abnormal bleeding or bruising. He denies fevers or chills. He denies pain. His appetite is good. His weight has decreased 5 pounds over last 3 weeks.  His sister states that he has recurrent conjunctivitis.  REVIEW OF SYSTEMS:  Review of Systems  Constitutional: Negative for appetite change, chills, fatigue, fever and unexpected weight change.  HENT:   Negative for lump/mass, mouth sores and sore throat.   Eyes: Positive for eye problems (Irritation, redness and drainage from both eyes).  Respiratory: Negative for cough and shortness of breath.   Cardiovascular: Negative for chest pain and leg swelling.  Gastrointestinal: Negative for abdominal pain, constipation, diarrhea, nausea and vomiting.  Genitourinary: Negative for difficulty urinating, dysuria, frequency and hematuria.   Musculoskeletal: Negative for arthralgias, back pain and myalgias.  Skin: Negative for itching, rash and wound.   Neurological: Negative for dizziness, extremity weakness, headaches, light-headedness and numbness.  Hematological: Negative for adenopathy.  Psychiatric/Behavioral: Negative for depression and sleep disturbance. The patient is not nervous/anxious.    VITALS:  Blood pressure 131/70, pulse 91, temperature 98.4 F (36.9 C), temperature source Oral, resp. rate 18, height '5\' 11"'  (1.803 m), weight (!) 301 lb 11.2 oz (136.9 kg), SpO2 96 %.  Wt Readings from Last 3 Encounters:  01/10/21 (!) 301 lb 11.2 oz (136.9 kg)  12/19/20 (!) 306 lb (138.8 kg)  12/05/20 (!) 307 lb 14.4 oz (139.7 kg)    Body mass index is 42.08 kg/m.  Performance status (ECOG): 1 - Symptomatic but completely ambulatory  PHYSICAL EXAM:  Physical Exam Vitals and nursing note reviewed.  Constitutional:      General: He is not in acute distress.    Appearance: Normal appearance. He is normal weight.  HENT:     Head: Normocephalic and atraumatic.     Mouth/Throat:     Mouth: Mucous membranes are moist.     Pharynx: Oropharynx is clear. No oropharyngeal exudate or posterior oropharyngeal erythema.  Eyes:     General: No scleral icterus.       Right eye: Discharge (Purulent) present.        Left eye: Discharge (Purulent) present.    Extraocular Movements: Extraocular movements intact.     Pupils: Pupils are equal, round, and reactive to light.  Cardiovascular:     Rate and Rhythm: Normal rate and regular rhythm.     Heart sounds: Normal heart sounds. No murmur heard. No friction rub. No gallop.   Pulmonary:     Effort: Pulmonary effort is normal.     Breath sounds: Normal breath sounds. No wheezing, rhonchi or rales.  Chest:  Breasts:     Right: No axillary adenopathy or supraclavicular adenopathy.     Left: No axillary adenopathy or supraclavicular adenopathy.    Abdominal:     General: Bowel sounds are normal. There is no distension.     Palpations: Abdomen is soft. There is no hepatomegaly, splenomegaly or  mass.     Tenderness: There is no abdominal tenderness.  Musculoskeletal:        General: Normal range of motion.     Cervical back: Normal range of motion and neck supple. No tenderness.     Right lower leg: Edema (1+) present.     Left lower leg: Edema (1+) present.  Lymphadenopathy:     Cervical: No cervical adenopathy.     Upper Body:     Right upper body: No supraclavicular or axillary adenopathy.     Left upper body: No supraclavicular or axillary adenopathy.     Lower Body: No right inguinal adenopathy. No left inguinal adenopathy.  Skin:    General: Skin is warm and dry.     Coloration: Skin is not jaundiced.     Findings: No rash.  Neurological:     Mental Status: He is alert and oriented to person, place, and time.     Cranial Nerves: No cranial nerve deficit.  Psychiatric:        Mood and Affect: Mood normal.        Behavior: Behavior normal.        Thought Content: Thought content normal.    LABS:   CBC  Latest Ref Rng & Units 01/10/2021 12/19/2020 12/05/2020  WBC - 13.6 15.1 16.8  Hemoglobin 13.5 - 17.5 13.3(A) 14.2 14.1  Hematocrit 41 - 53 40(A) 44 43  Platelets 150 - 399 179 98(A) 100(A)   CMP Latest Ref Rng & Units 01/10/2021 12/19/2020 12/05/2020  BUN 4 - '21 16 16 19  ' Creatinine 0.6 - 1.3 0.6 0.7 0.6  Sodium 137 - 147 141 139 137  Potassium 3.4 - 5.3 3.6 3.7 3.5  Chloride 99 - 108 99 99 99  CO2 13 - 22 32(A) 31(A) 31(A)  Calcium 8.7 - 10.7 8.5(A) 9.2 9.1  Alkaline Phos 25 - 125 65 62 62  AST 14 - 40 '19 22 19  ' ALT 10 - 40 36 29 28     No results found for: CEA1 / No results found for: CEA1 No results found for: PSA1 No results found for: YTK354 No results found for: CAN125  No results found for: TOTALPROTELP, ALBUMINELP, A1GS, A2GS, BETS, BETA2SER, GAMS, MSPIKE, SPEI No results found for: TIBC, FERRITIN, IRONPCTSAT No results found for: LDH  STUDIES:  No results found.    HISTORY:   Past Medical History:  Diagnosis Date  . Allergy   . GERD  (gastroesophageal reflux disease)   . Hypertension   . Immune thrombocytopenic purpura ([place])   . Sleep apnea    wears a mouth piece  . Thrombocytopenia, unspecified ([place])     History reviewed. No pertinent surgical history.  Family History  Problem Relation Age of Onset  . Heart attack Father   . Thrombosis Maternal Aunt        fatal  . Thrombosis Maternal Uncle        fatal  . Heart attack Paternal Uncle   . Breast cancer Maternal Grandmother        25s  . Thrombosis Maternal Grandmother        fatal  . Heart attack Paternal Grandfather     Social History:  reports that he has never smoked. He has quit using smokeless tobacco. He reports previous alcohol use. He reports previous drug use.The patient is accompanied by his sister today.  Allergies: No Known Allergies  Current Medications: Current Outpatient Medications  Medication Sig Dispense Refill  . predniSONE (DELTASONE) 5 MG tablet Take 1 tablet (5 mg total) by mouth 2 (two) times daily with a meal. 100 tablet 1  . Accu-Chek FastClix Lancets MISC Apply topically daily.    Marland Kitchen ACCU-CHEK GUIDE test strip USE TO check blood sugar UP TO THREE TIMES DAILY    . amLODipine (NORVASC) 10 MG tablet Take 10 mg by mouth daily.    . Ascorbic Acid (VITAMIN C) 1000 MG tablet Take 1,000 mg by mouth daily.    . benazepril (LOTENSIN) 40 MG tablet Take 40 mg by mouth daily.    . Blood Glucose Monitoring Suppl (ACCU-CHEK GUIDE) w/Device KIT See admin instructions.    . cholecalciferol (VITAMIN D3) 25 MCG (1000 UNIT) tablet Take 1,000 Units by mouth daily.    Marland Kitchen erythromycin ophthalmic ointment Place 1 application into both eyes at bedtime. For 7 days 3.5 g 0  . escitalopram (LEXAPRO) 10 MG tablet Take 10 mg by mouth daily. STARTED ON 10MG AS OF YESTERDAY.    Marland Kitchen escitalopram (LEXAPRO) 5 MG tablet Take 5 mg by mouth daily.    . fexofenadine (ALLEGRA) 180 MG tablet Take 180 mg by mouth daily.    . fluconazole (DIFLUCAN) 200 MG tablet Take 200  mg by mouth daily.    Marland Kitchen glipiZIDE (GLUCOTROL XL) 10 MG 24 hr tablet Take 10 mg by mouth daily.    . indapamide (LOZOL) 2.5 MG tablet Take 2.5 mg by mouth daily.    . metFORMIN (GLUCOPHAGE) 500 MG tablet Take 2,000 mg by mouth Nightly.    Marland Kitchen omeprazole (PRILOSEC) 20 MG capsule Take 20 mg by mouth 4 (four) times daily.    . potassium chloride SA (KLOR-CON) 20 MEQ tablet Take 20 mEq by mouth 3 (three) times daily.    . vitamin B-12 (CYANOCOBALAMIN) 1000 MCG tablet Take 1,000 mcg by mouth daily.    . vitamin k 100 MCG tablet Take 100 mcg by mouth daily.     No current facility-administered medications for this visit.     ASSESSMENT & PLAN:   Assessment:  1. Significant thrombocytopenia, which has been fluctuating up and down since June 2020. This is felt to represent immune thrombocytopenic purpura. We placed him on prednisone 20 mg three times daily in early June.  We were unable to taper his steroids, so he underwent rituximab therapy weekly for 4 weeks in October 2021 with a partial response.  He is not a good candidate for splenectomy unless absolutely necessary. His platelet count has remained above 75,000, but he has been reluctant to taper the prednisone, as each time his platelets will drop slightly before coming back up.  His platelets are normal today, so we will decrease the prednisone to 10 mg daily.  2. Leukocytosis, secondary to high dose steroids.   3. Hypertension, secondary to high-dose prednisone, which is improving.   4.  Mild hypokalemia despite potassium chloride 20 meq 3 times daily. His potassium is borderline normal today, so he will continue the same dose of potassium.  5. Steroid induced diabetes, for which he continues metformin 500 mg twice daily and glipizide 5 mg daily.  His blood sugar is in good range today.  6. Elevated Epstein Barr titers. This may be indicative of chronic fatigue syndrome, and I advised that he rest as needed. We had a discussion  about this diagnosis. His sister has him taking zinc, lemon balm, and L lysine.  7. Generalized weakness consistent with steroid myopathy, which is stable.  8. Sleep apnea, untreated. I encouraged him to use his CPAP mask for better quality of life and to reduce his daytime sleepiness.   9.  Lower extremity edema, felt to be due to chronic steroid use, he still complains of this being increased by the end of the day..  10.  Chronic anxiety, mainly related to his health.  11.  Recurrent bilateral conjunctivitis, I sent him in a refill of the erythromycin appointment.  Plan:   As his platelet count is normal, I will have him decrease the prednisone to 10 mg daily. We had planned appointments every 2 weeks, but he missed last week due to illness.  To keep his appointments on Wednesdays as he desires, I will see him back in 1 week with a CBC and comprehensive metabolic panel, then we will go back to every 2 week visits while continue to taper his prednisone. The patient and his sister understand the plans discussed today and  are in agreement with them. They know to contact our office if he develops concerns prior to his next appointment.     Marvia Pickles, PA-C

## 2021-01-16 ENCOUNTER — Other Ambulatory Visit: Payer: Self-pay

## 2021-01-16 ENCOUNTER — Inpatient Hospital Stay: Payer: 59

## 2021-01-16 ENCOUNTER — Inpatient Hospital Stay (INDEPENDENT_AMBULATORY_CARE_PROVIDER_SITE_OTHER): Payer: 59 | Admitting: Hematology and Oncology

## 2021-01-16 ENCOUNTER — Encounter: Payer: Self-pay | Admitting: Hematology and Oncology

## 2021-01-16 VITALS — BP 154/81 | HR 91 | Temp 98.7°F | Resp 16 | Ht 71.0 in | Wt 297.9 lb

## 2021-01-16 DIAGNOSIS — D693 Immune thrombocytopenic purpura: Secondary | ICD-10-CM

## 2021-01-16 LAB — CBC AND DIFFERENTIAL
HCT: 41 (ref 41–53)
Hemoglobin: 13.6 (ref 13.5–17.5)
Neutrophils Absolute: 10
Platelets: 151 (ref 150–399)
WBC: 13.7

## 2021-01-16 LAB — BASIC METABOLIC PANEL
BUN: 16 (ref 4–21)
CO2: 29 — AB (ref 13–22)
Chloride: 99 (ref 99–108)
Creatinine: 0.7 (ref 0.6–1.3)
Glucose: 100
Potassium: 3.7 (ref 3.4–5.3)
Sodium: 139 (ref 137–147)

## 2021-01-16 LAB — HEPATIC FUNCTION PANEL
ALT: 20 (ref 10–40)
AST: 22 (ref 14–40)
Alkaline Phosphatase: 66 (ref 25–125)
Bilirubin, Total: 1.3

## 2021-01-16 LAB — COMPREHENSIVE METABOLIC PANEL
Albumin: 4.5 (ref 3.5–5.0)
Calcium: 8.9 (ref 8.7–10.7)

## 2021-01-16 LAB — CBC: RBC: 4.85 (ref 3.87–5.11)

## 2021-01-16 NOTE — Progress Notes (Signed)
[facility]  [address] [place],  [place]  [id number] [phone]  Clinic Day:  01/16/2021  Referring physician: [name], NP   CHIEF COMPLAINT:  CC:  Immune thrombocytopenic purpura  Current Treatment:   Prednisone 15 mg daily   HISTORY OF PRESENT ILLNESS:  Mike [name] is a 50 y.o. male with a history of severe thrombocytopenia, felt to represent immune thrombocytopenic purpura. He has had thrombocytopenia, which has fluctuated up and down since June 2020. Further laboratory evaluation did not reveal any other specific etiology for his thrombocytopenia. He had mildly elevated liver transaminases. Abdominal ultrasound revealed increased echogenicity of hepatic parenchyma suggesting hepatic steatosis, with probable fatty sparing adjacent to gallbladder fossa. No other abnormality was seen. We recommended bone marrow examination, but the patient and his mother declined. The platelet count continued to fluctuate up and down, but as his platelet count remained 25,000 in June 2021, he was started on prednisone 20 mg 3 times daily, with a good response. He has had leukocytosis secondary to the high-dose prednisone. He also has had oral candidiasis on occasion. [name] titers were elevated, so this may be representative of chronic fatigue syndrome. We decreased his prednisone to 45 mg in late September and platelets remained stable at 97,000. Since we were unable to taper him off the prednisone he was treated with rituximab weekly for 4 weeks in October and November. We have been slowly tapering the prednisone. His platelet count has remained above 75,000 despite decreasing the prednisone down to 11m daily in February, but he has been hesitant to decrease the prednisone further. 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  He has also had hypokalemia resolved with oral supplementation.   More recently, he developed lower extremity edema and blurry vision felt to be secondary to prednisone. At his visit on March 9th, his platelets were up to 98,000, so we decreased the prednisone to 25 mg daily.  He had redness, irritation and morning discharge from his eyes, so was treated for conjunctivitis with erythromycin ointment.  On April 6th, his platelets were stable at 100,000, so we decreased prednisone to 20 mg daily.  His platelets remain stable at 98,000 on April 20th, so we reduced the prednisone to 15 mg daily.  He was seen on April 11th, as he had been ill the week before and his platelet count was 179,000, so I decreased his prednisone to 10 mg daily.  He had recurrent bilateral conjunctivitis, so I placed him back on erythromycin ophthalmic ointment daily again.  INTERVAL HISTORY:  MTytusis here today for repeat clinical assessment and continues to do fairly well. He continues to report fatigue. He states his lower extremity edema continues to increase during the day, but go down overnight.  He has pain in his legs with the swelling, which is stable. He denies abnormal bleeding or bruising. He denies fevers or chills. He denies pain. His appetite is good. His weight has decreased 3 pounds over last week.   REVIEW OF SYSTEMS:  Review of Systems  Constitutional: Negative for appetite change, chills, fatigue, fever and unexpected weight change.  HENT:   Negative for lump/mass, mouth sores and sore throat.   Respiratory: Negative for cough and shortness of breath.   Cardiovascular: Negative for chest pain and leg swelling.  Gastrointestinal: Negative for abdominal pain, constipation, diarrhea, nausea and vomiting.  Genitourinary: Negative for difficulty urinating, dysuria, frequency and hematuria.  Musculoskeletal: Negative for arthralgias, back pain and myalgias.  Skin: Negative for itching, rash and wound.  Neurological: Negative for dizziness, extremity weakness, headaches,  light-headedness and numbness.  Hematological: Negative for adenopathy.  Psychiatric/Behavioral: Negative for depression and sleep disturbance. The patient is not nervous/anxious.    VITALS:  Blood pressure (!) 154/81, pulse 91, temperature 98.7 F (37.1 C), resp. rate 16, height '5\' 11"'  (1.803 m), weight 297 lb 14.4 oz (135.1 kg), SpO2 98 %.  Wt Readings from Last 3 Encounters:  01/16/21 297 lb 14.4 oz (135.1 kg)  01/10/21 (!) 301 lb 11.2 oz (136.9 kg)  12/19/20 (!) 306 lb (138.8 kg)    Body mass index is 41.55 kg/m.  Performance status (ECOG): 1 - Symptomatic but completely ambulatory  PHYSICAL EXAM:  Physical Exam Vitals and nursing note reviewed.  Constitutional:      General: He is not in acute distress.    Appearance: Normal appearance. He is normal weight.  HENT:     Head: Normocephalic and atraumatic.     Mouth/Throat:     Mouth: Mucous membranes are moist.     Pharynx: Oropharynx is clear. No oropharyngeal exudate or posterior oropharyngeal erythema.  Eyes:     General: No scleral icterus.    Extraocular Movements: Extraocular movements intact.     Conjunctiva/sclera: Conjunctivae normal.     Pupils: Pupils are equal, round, and reactive to light.  Cardiovascular:     Rate and Rhythm: Normal rate and regular rhythm.     Heart sounds: Normal heart sounds. No murmur heard. No friction rub. No gallop.   Pulmonary:     Effort: Pulmonary effort is normal.     Breath sounds: Normal breath sounds. No wheezing, rhonchi or rales.  Chest:  Breasts:     Right: No axillary adenopathy or supraclavicular adenopathy.     Left: No axillary adenopathy or supraclavicular adenopathy.    Abdominal:     General: Bowel sounds are normal. There is no distension.     Palpations: Abdomen is soft. There is no hepatomegaly, splenomegaly or mass.     Tenderness: There is no abdominal tenderness.  Musculoskeletal:        General: Normal range of motion.     Cervical back: Normal range  of motion and neck supple. No tenderness.     Right lower leg: Edema (1+ with chronic stasis changes) present.     Left lower leg: Edema (1+ with chronic stasis changes) present.  Lymphadenopathy:     Cervical: No cervical adenopathy.     Upper Body:     Right upper body: No supraclavicular or axillary adenopathy.     Left upper body: No supraclavicular or axillary adenopathy.     Lower Body: No right inguinal adenopathy. No left inguinal adenopathy.  Skin:    General: Skin is warm and dry.     Coloration: Skin is not jaundiced.     Findings: No rash.  Neurological:     Mental Status: He is alert and oriented to person, place, and time.     Cranial Nerves: No cranial nerve deficit.  Psychiatric:        Mood and Affect: Mood normal.        Behavior: Behavior normal.        Thought Content: Thought content normal.    LABS:   CBC Latest Ref Rng & Units 01/16/2021 01/10/2021 12/19/2020  WBC - 13.7 13.6 15.1  Hemoglobin 13.5 - 17.5 13.6 13.3(A) 14.2  Hematocrit 41 - 53 41 40(A) 44  Platelets 150 - 399 151 179 98(A)   CMP Latest Ref Rng & Units 01/16/2021 01/10/2021 12/19/2020  BUN 4 - '21 16 16 16  ' Creatinine 0.6 - 1.3 0.7 0.6 0.7  Sodium 137 - 147 139 141 139  Potassium 3.4 - 5.3 3.7 3.6 3.7  Chloride 99 - 108 99 99 99  CO2 13 - 22 29(A) 32(A) 31(A)  Calcium 8.7 - 10.7 8.9 8.5(A) 9.2  Alkaline Phos 25 - 125 66 65 62  AST 14 - 40 '22 19 22  ' ALT 10 - 40 20 36 29     No results found for: CEA1 / No results found for: CEA1 No results found for: PSA1 No results found for: WFU932 No results found for: TFT732  No results found for: TOTALPROTELP, ALBUMINELP, A1GS, A2GS, BETS, BETA2SER, GAMS, MSPIKE, SPEI No results found for: TIBC, FERRITIN, IRONPCTSAT No results found for: LDH  STUDIES:  No results found.    HISTORY:   Past Medical History:  Diagnosis Date  . Allergy   . GERD (gastroesophageal reflux disease)   . Hypertension   . Immune thrombocytopenic purpura ([place])   .  Sleep apnea    wears a mouth piece  . Thrombocytopenia, unspecified ([place])     History reviewed. No pertinent surgical history.  Family History  Problem Relation Age of Onset  . Heart attack Father   . Thrombosis Maternal Aunt        fatal  . Thrombosis Maternal Uncle        fatal  . Heart attack Paternal Uncle   . Breast cancer Maternal Grandmother        81s  . Thrombosis Maternal Grandmother        fatal  . Heart attack Paternal Grandfather     Social History:  reports that he has never smoked. He has quit using smokeless tobacco. He reports previous alcohol use. He reports previous drug use.The patient is alone today.  Allergies: No Known Allergies  Current Medications: Current Outpatient Medications  Medication Sig Dispense Refill  . Accu-Chek FastClix Lancets MISC Apply topically daily.    Marland Kitchen ACCU-CHEK GUIDE test strip USE TO check blood sugar UP TO THREE TIMES DAILY    . amLODipine (NORVASC) 10 MG tablet Take 10 mg by mouth daily.    . Ascorbic Acid (VITAMIN C) 1000 MG tablet Take 1,000 mg by mouth daily.    . benazepril (LOTENSIN) 40 MG tablet Take 40 mg by mouth daily.    . Blood Glucose Monitoring Suppl (ACCU-CHEK GUIDE) w/Device KIT See admin instructions.    . cholecalciferol (VITAMIN D3) 25 MCG (1000 UNIT) tablet Take 1,000 Units by mouth daily.    Marland Kitchen erythromycin ophthalmic ointment Place 1 application into both eyes at bedtime. For 7 days 3.5 g 0  . escitalopram (LEXAPRO) 10 MG tablet Take 10 mg by mouth daily. STARTED ON 10MG AS OF YESTERDAY.    Marland Kitchen escitalopram (LEXAPRO) 5 MG tablet Take 5 mg by mouth daily.    . fexofenadine (ALLEGRA) 180 MG tablet Take 180 mg by mouth daily.    . fluconazole (DIFLUCAN) 200 MG tablet Take 200 mg by mouth daily.    Marland Kitchen glipiZIDE (GLUCOTROL XL) 10 MG 24 hr tablet Take 10 mg by mouth daily.    . indapamide (LOZOL) 2.5 MG tablet Take 2.5 mg by mouth daily.    . metFORMIN (GLUCOPHAGE) 500 MG tablet Take 2,000 mg by mouth Nightly.     Marland Kitchen  omeprazole (PRILOSEC) 20 MG capsule Take 20 mg by mouth 4 (four) times daily.    . potassium chloride SA (KLOR-CON) 20 MEQ tablet Take 20 mEq by mouth 3 (three) times daily.    . predniSONE (DELTASONE) 5 MG tablet Take 1 tablet (5 mg total) by mouth 2 (two) times daily with a meal. 100 tablet 1  . vitamin B-12 (CYANOCOBALAMIN) 1000 MCG tablet Take 1,000 mcg by mouth daily.    . vitamin k 100 MCG tablet Take 100 mcg by mouth daily.     No current facility-administered medications for this visit.     ASSESSMENT & PLAN:   Assessment:  1. Significant thrombocytopenia, which has been fluctuating up and down since June 2020. This is felt to represent immune thrombocytopenic purpura. We placed him on prednisone 20 mg three times daily in early June.  We were unable to taper his steroids, so he underwent rituximab therapy weekly for 4 weeks in October 2021 with a partial response.  He is not a good candidate for splenectomy unless absolutely necessary. His platelet count has remained above 75,000, but he has been reluctant to taper the prednisone, as each time his platelets will drop slightly before coming back up.  His platelets remain normal today.  I would like to decrease prednisone to  5 mg daily.  2. Leukocytosis, secondary to high dose steroids.   3. Hypertension, secondary to high-dose prednisone, which is fluctuating up and down.   4.  Mild hypokalemia despite potassium chloride 20 meq 3 times daily. His potassium remains normal, so he will continue the same dose of potassium.  5. Steroid induced diabetes, for which he continues metformin 500 mg twice daily and glipizide 5 mg daily.  His blood sugar is in good range today.  6. Elevated Epstein Barr titers. This may be indicative of chronic fatigue syndrome, and I advised that he rest as needed. We had a discussion about this diagnosis. His sister has him taking zinc, lemon balm, and L lysine.  7. Generalized weakness  consistent with steroid myopathy, which is stable.  8. Sleep apnea, untreated. I encouraged him to use his CPAP mask for better quality of life and to reduce his daytime sleepiness.   9.  Lower extremity edema, felt to be due to chronic steroid use, he still complains of this being increased by the end of the day..  10.  Chronic anxiety, mainly related to his health.  11.  Bilateral conjunctivitis, improving with re-treatment with erythromycin ophthalmic ointment.  Plan:   As his platelet count remains normal, I will have him decrease the prednisone to  5 mg daily.  I will plan to see him back in 2 weeks with a CBC and comprehensive metabolic panel. The patient understands the plans discussed today and is in agreement with them.  He knows to contact our office if he develops concerns prior to his next appointment.     Marvia Pickles, PA-C

## 2021-01-29 ENCOUNTER — Telehealth: Payer: Self-pay | Admitting: Hematology and Oncology

## 2021-01-29 NOTE — Telephone Encounter (Signed)
01/29/21 Per patients sister(Connie)cancelled 6/1/22l appts.Patient has covid.

## 2021-01-30 ENCOUNTER — Inpatient Hospital Stay: Payer: 59

## 2021-01-30 ENCOUNTER — Ambulatory Visit: Payer: 59 | Admitting: Hematology and Oncology

## 2021-01-30 ENCOUNTER — Inpatient Hospital Stay: Payer: 59 | Admitting: Hematology and Oncology

## 2021-02-01 ENCOUNTER — Encounter: Payer: Self-pay | Admitting: Oncology

## 2021-02-12 NOTE — Progress Notes (Signed)
[facility]  [address] [place],  [place]  [address] [phone]  Clinic Day:  02/13/2021  Referring physician: [name], NP   CHIEF COMPLAINT:  CC:  Immune thrombocytopenic purpura  Current Treatment:   Prednisone 5 mg daily   HISTORY OF PRESENT ILLNESS:  Mike [name] is a 50 y.o. male with a history of severe thrombocytopenia, felt to represent immune thrombocytopenic purpura.  He has had thrombocytopenia, which has fluctuated up and down since June 2020.  Further laboratory evaluation did not reveal any other specific etiology for his thrombocytopenia.  He had mildly elevated liver transaminases.  Abdominal ultrasound revealed increased echogenicity of hepatic parenchyma suggesting hepatic steatosis, with probable fatty sparing adjacent to gallbladder fossa.  No other abnormality was seen.  We recommended bone marrow examination, but [name] and his mother declined.  The platelet count continued to fluctuate up and down, but as his platelet count remained 25,000 in June 2021, he was started on prednisone 20 mg 3 times daily, with a good response.  He has had leukocytosis secondary to the high-dose prednisone.  He also has had oral candidiasis on occasion. Mike [name] titers were elevated, so this may be representative of chronic fatigue syndrome. We decreased his prednisone to 45 mg in late September and platelets remained stable at 97,000.  Since we were unable to taper him off the prednisone he was treated with rituximab weekly for 4 weeks in October and November.  We have been slowly tapering the prednisone.  His platelet count has remained above 75,000 despite decreasing the prednisone down to 62m daily in February, but he has been hesitant to decrease the prednisone further. 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  He has also had hypokalemia resolved with oral supplementation.   More recently, he developed lower extremity edema and blurry vision felt to be secondary to prednisone. At his visit on March 9th, his platelets were up to 98,000, so we decreased the prednisone to 25 mg daily.  He had redness, irritation and morning discharge from his eyes, so was treated for conjunctivitis with erythromycin ointment.  On April 6th, his platelets were stable at 100,000, so we decreased prednisone to 20 mg daily.  His platelets remain stable at 98,000 on April 20th, so we reduced the prednisone to 15 mg daily.  He was seen on May 11th, as he had been ill the week before with a diarrheal illness and his platelet count was 179,000, so I decreased his prednisone to 10 mg daily.  He had recurrent bilateral conjunctivitis, so I placed him back on erythromycin ophthalmic ointment daily again.  On May18th, his platelet count was 151,000, so I decreased his prednisone to 568mdaily.  INTERVAL HISTORY:  Mike [name] here today for repeat clinical assessment. He was admitted with COVID-19 on May 24th after leaving work earlier due to altered mental status and generalized weakness.  He simply had not felt well for several days, so stayed in bed without eating or drinking, then attempted to go back to work on schedule.  He had acute kidney injury with a creatinine of 2.2, which normalized with IV hydration. He had fever, but no respiratory symptoms. He was discharged on a prolonged prednisone taper over 15 weeks.  He saw his primary care provider after discharge from the hospital and she stopped his indapamide, and decreased his potassium chloride 20 mEq to once a day.  The  [name] states he has continued potassium chloride 20 mEq, 2 tablets daily, as he was previously on 3 tablets daily.  He has been having some chest pain since discontinuing indapamide, so has been referred to Cardiology and sees Dr. Geraldo Pitter on June 22nd.  He states the chest pain is intermittent, occasionally with deep inspiration, but  also occasional pressure-like pain.  He also reports dyspnea with exertion. He continues to report fatigue. He states he still has intermittent lower extremity edema and wears compression hose. He denies abnormal bleeding or bruising. He denies fevers or chills. He denies pain. His appetite is good. His weight has decreased 3 pounds in 4 weeks.  His sister accompanies him today and states his primary care recommended he return to work on June 30th and she is concerned he will not be able to manage this as he works 12 hour shifts, 3-4 days a week.  REVIEW OF SYSTEMS:  Review of Systems  Constitutional:  Negative for appetite change, chills, fatigue, fever and unexpected weight change.  HENT:   Negative for lump/mass, mouth sores and sore throat.   Respiratory:  Negative for cough and shortness of breath.   Cardiovascular:  Negative for chest pain and leg swelling.  Gastrointestinal:  Negative for abdominal pain, constipation, diarrhea, nausea and vomiting.  Genitourinary:  Negative for difficulty urinating, dysuria, frequency and hematuria.   Musculoskeletal:  Negative for arthralgias, back pain and myalgias.  Skin:  Negative for itching, rash and wound.  Neurological:  Negative for dizziness, extremity weakness, headaches, light-headedness and numbness.  Hematological:  Negative for adenopathy.  Psychiatric/Behavioral:  Negative for depression and sleep disturbance. [name] is not nervous/anxious.    VITALS:  Blood pressure 129/78, pulse 92, temperature 98.3 F (36.8 C), temperature source Oral, resp. rate 18, height _0  (1.803 m), weight 286 lb 12.8 oz (130.1 kg), SpO2 97 %.  Wt Readings from Last 3 Encounters:  02/13/21 286 lb 12.8 oz (130.1 kg)  01/16/21 297 lb 14.4 oz (135.1 kg)  01/10/21 (!) 301 lb 11.2 oz (136.9 kg)    Body mass index is 40 kg/m.  Performance status (ECOG): 1 - Symptomatic but completely ambulatory  PHYSICAL EXAM:  Physical Exam Vitals and nursing note  reviewed.  Constitutional:      General: He is not in acute distress.    Appearance: Normal appearance. He is normal weight.  HENT:     Head: Normocephalic and atraumatic.     Mouth/Throat:     Mouth: Mucous membranes are moist.     Pharynx: Oropharynx is clear. No oropharyngeal exudate or posterior oropharyngeal erythema.  Eyes:     General: No scleral icterus.    Extraocular Movements: Extraocular movements intact.     Conjunctiva/sclera: Conjunctivae normal.     Pupils: Pupils are equal, round, and reactive to light.  Cardiovascular:     Rate and Rhythm: Normal rate and regular rhythm.     Heart sounds: Normal heart sounds. No murmur heard.   No friction rub. No gallop.  Pulmonary:     Effort: Pulmonary effort is normal.     Breath sounds: Normal breath sounds. No wheezing, rhonchi or rales.  Chest:  Breasts:    Right: No axillary adenopathy or supraclavicular adenopathy.     Left: No axillary adenopathy or supraclavicular adenopathy.  Abdominal:     General: Bowel sounds are normal. There is no distension.     Palpations: Abdomen is soft. There is no hepatomegaly, splenomegaly or mass.  Tenderness: There is no abdominal tenderness.  Musculoskeletal:        General: Normal range of motion.     Cervical back: Normal range of motion and neck supple. No tenderness.     Right lower leg: Edema (1+ with chronic stasis changes) present.     Left lower leg: Edema (1+ with chronic stasis changes) present.  Lymphadenopathy:     Cervical: No cervical adenopathy.     Upper Body:     Right upper body: No supraclavicular or axillary adenopathy.     Left upper body: No supraclavicular or axillary adenopathy.     Lower Body: No right inguinal adenopathy. No left inguinal adenopathy.  Skin:    General: Skin is warm and dry.     Coloration: Skin is not jaundiced.     Findings: No rash.  Neurological:     Mental Status: He is alert and oriented to person, place, and time.     Cranial  Nerves: No cranial nerve deficit.  Psychiatric:        Mood and Affect: Mood normal.        Behavior: Behavior normal.        Thought Content: Thought content normal.   LABS:   CBC Latest Ref Rng & Units 02/13/2021 01/16/2021 01/10/2021  WBC - 11.2 13.7 13.6  Hemoglobin 13.5 - 17.5 12.2(A) 13.6 13.3(A)  Hematocrit 41 - 53 36(A) 41 40(A)  Platelets 150 - 399 122(A) 151 179   CMP Latest Ref Rng & Units 02/13/2021 01/16/2021 01/10/2021  BUN 4 - _0 Creatinine 0.6 - 1.3 0.8 0.7 0.6  Sodium 137 - 147 142 139 141  Potassium 3.4 - 5.3 3.1(A) 3.7 3.6  Chloride 99 - 108 104 99 99  CO2 13 - 22 27(A) 29(A) 32(A)  Calcium 8.7 - 10.7 7.7(A) 8.9 8.5(A)  Alkaline Phos 25 - 125 57 66 65  AST 14 - 40 _1 ALT 10 - 40 18 20 36     No results found for: CEA1 / No results found for: CEA1 No results found for: PSA1 No results found for: BWL893 No results found for: CAN125  No results found for: TOTALPROTELP, ALBUMINELP, A1GS, A2GS, BETS, BETA2SER, GAMS, MSPIKE, SPEI No results found for: TIBC, FERRITIN, IRONPCTSAT No results found for: LDH  STUDIES:  No results found.    HISTORY:   Past Medical History:  Diagnosis Date   Allergy    GERD (gastroesophageal reflux disease)    Hypertension    Immune thrombocytopenic purpura ([place])    Sleep apnea    wears a mouth piece   Thrombocytopenia, unspecified (HCC)     No past surgical history on file.  Family History  Problem Relation Age of Onset   Heart attack Father    Thrombosis Maternal Aunt        fatal   Thrombosis Maternal Uncle        fatal   Heart attack Paternal Uncle    Breast cancer Maternal Grandmother        80s   Thrombosis Maternal Grandmother        fatal   Heart attack Paternal Grandfather     Social History:  reports that he has never smoked. He has quit using smokeless tobacco. He reports previous alcohol use. He reports previous drug use.[name] is accompanied by his sister today.  Allergies: No  Known Allergies  Current Medications: Current Outpatient Medications  Medication Sig Dispense  Refill   Accu-Chek FastClix Lancets MISC Apply topically daily.     ACCU-CHEK GUIDE test strip USE TO check blood sugar UP TO THREE TIMES DAILY     amLODipine (NORVASC) 10 MG tablet Take 10 mg by mouth daily.     Ascorbic Acid (VITAMIN C) 1000 MG tablet Take 1,000 mg by mouth daily.     benazepril (LOTENSIN) 40 MG tablet Take 40 mg by mouth daily.     Blood Glucose Monitoring Suppl (ACCU-CHEK GUIDE) w/Device KIT See admin instructions.     cholecalciferol (VITAMIN D3) 25 MCG (1000 UNIT) tablet Take 1,000 Units by mouth daily.     erythromycin ophthalmic ointment Place 1 application into both eyes at bedtime. For 7 days 3.5 g 0   escitalopram (LEXAPRO) 10 MG tablet Take 10 mg by mouth daily. STARTED ON 10MG AS OF YESTERDAY.     escitalopram (LEXAPRO) 5 MG tablet Take 5 mg by mouth daily.     fexofenadine (ALLEGRA) 180 MG tablet Take 180 mg by mouth daily.     fluconazole (DIFLUCAN) 200 MG tablet Take 200 mg by mouth daily.     glipiZIDE (GLUCOTROL XL) 10 MG 24 hr tablet Take 10 mg by mouth daily.     indapamide (LOZOL) 2.5 MG tablet Take 2.5 mg by mouth daily.     metFORMIN (GLUCOPHAGE) 500 MG tablet Take 2,000 mg by mouth Nightly.     omeprazole (PRILOSEC) 20 MG capsule Take 20 mg by mouth 4 (four) times daily.     potassium chloride SA (KLOR-CON) 20 MEQ tablet Take 20 mEq by mouth 3 (three) times daily.     predniSONE (DELTASONE) 5 MG tablet Take 1 tablet (5 mg total) by mouth 2 (two) times daily with a meal. 100 tablet 1   vitamin B-12 (CYANOCOBALAMIN) 1000 MCG tablet Take 1,000 mcg by mouth daily.     vitamin k 100 MCG tablet Take 100 mcg by mouth daily.     No current facility-administered medications for this visit.     ASSESSMENT & PLAN:   Assessment:  1. Significant thrombocytopenia, which has been fluctuating up and down since June 2020.  This is felt to represent immune  thrombocytopenic purpura.  We placed him on prednisone 20 mg three times daily in early June.  We were unable to taper his steroids, so he underwent rituximab therapy weekly for 4 weeks in October 2021 with a partial response.  He is not a good candidate for splenectomy unless absolutely necessary.  His platelet count has remained above 75,000, we have steadily tapered his prednisone and prior to his recent hospital admission, he was on prednisone 72m daily. He is currently on high-dose prednisone taper, so we will simply monitor him until his prednisone dose gets back to 5 mg daily. His platelets are mildly low today.   2. Leukocytosis, secondary to high dose steroids, which has worsened with the increase prednisone dose.     3. Hypertension, secondary to chronic prednisone , which is controlled.      4.  Mild hypokalemia with decreasing the potassium chloride 20 meq  to 2 times daily. I will have him resume potassium chloride 20 mEq 3 times daily.   5. Steroid induced diabetes, for which he continues metformin 500 mg twice daily and glipizide 5 mg daily.     6.  Elevated Epstein Barr titers.  This may be indicative of chronic fatigue syndrome, and I advised that he rest as needed.  We had  a discussion about this diagnosis.  His sister has him taking zinc, lemon balm, and L lysine.   7.  Generalized weakness consistent with steroid myopathy, which is stable.   8.  Sleep apnea, untreated. I have encouraged him to use his CPAP mask for better quality of life and to reduce his daytime sleepiness.    9.  Lower extremity edema, felt to be due to chronic steroid use, he still reports this being increased by the end of the day.   10.  Chronic anxiety, mainly related to his health.  11.  Bilateral conjunctivitis, resolved with re-treatment with erythromycin ophthalmic ointment.  12.  Recent admission with acute kidney injury due to dehydration associated with COVID-19.   He is on a 15 week prednisone  taper.  I advised him that when his prednisone dose gets to 5 mg daily, he continue that until his next appointment with [place].   Plan:    He will continue the prednisone taper as prescribed by the hospitalist. I will plan to see him back in 2 weeks with a CBC and comprehensive metabolic panel. [name] and his sister understand the plans discussed today and are in agreement with them. They know to contact our office if he develops concerns prior to his next appointment.     Mike Pickles, PA-C

## 2021-02-13 ENCOUNTER — Other Ambulatory Visit: Payer: Self-pay

## 2021-02-13 ENCOUNTER — Encounter: Payer: Self-pay | Admitting: Hematology and Oncology

## 2021-02-13 ENCOUNTER — Inpatient Hospital Stay: Payer: 59

## 2021-02-13 ENCOUNTER — Inpatient Hospital Stay: Payer: 59 | Attending: Oncology | Admitting: Hematology and Oncology

## 2021-02-13 VITALS — BP 129/78 | HR 92 | Temp 98.3°F | Resp 18 | Ht 71.0 in | Wt 286.8 lb

## 2021-02-13 DIAGNOSIS — D693 Immune thrombocytopenic purpura: Secondary | ICD-10-CM

## 2021-02-13 LAB — CBC AND DIFFERENTIAL
HCT: 36 — AB (ref 41–53)
Hemoglobin: 12.2 — AB (ref 13.5–17.5)
Neutrophils Absolute: 8.06
Platelets: 122 — AB (ref 150–399)
WBC: 11.2

## 2021-02-13 LAB — BASIC METABOLIC PANEL
BUN: 11 (ref 4–21)
CO2: 27 — AB (ref 13–22)
Chloride: 104 (ref 99–108)
Creatinine: 0.8 (ref 0.6–1.3)
Glucose: 81
Potassium: 3.1 — AB (ref 3.4–5.3)
Sodium: 142 (ref 137–147)

## 2021-02-13 LAB — COMPREHENSIVE METABOLIC PANEL
Albumin: 4.3 (ref 3.5–5.0)
Calcium: 7.7 — AB (ref 8.7–10.7)

## 2021-02-13 LAB — HEPATIC FUNCTION PANEL
ALT: 18 (ref 10–40)
AST: 20 (ref 14–40)
Alkaline Phosphatase: 57 (ref 25–125)
Bilirubin, Total: 0.9

## 2021-02-13 LAB — CBC: RBC: 4.34 (ref 3.87–5.11)

## 2021-02-15 ENCOUNTER — Encounter: Payer: Self-pay | Admitting: Oncology

## 2021-02-19 ENCOUNTER — Encounter: Payer: Self-pay | Admitting: Oncology

## 2021-02-19 ENCOUNTER — Encounter: Payer: Self-pay | Admitting: Cardiology

## 2021-02-19 DIAGNOSIS — I10 Essential (primary) hypertension: Secondary | ICD-10-CM | POA: Insufficient documentation

## 2021-02-19 DIAGNOSIS — G4730 Sleep apnea, unspecified: Secondary | ICD-10-CM | POA: Insufficient documentation

## 2021-02-19 DIAGNOSIS — D696 Thrombocytopenia, unspecified: Secondary | ICD-10-CM | POA: Insufficient documentation

## 2021-02-19 DIAGNOSIS — K219 Gastro-esophageal reflux disease without esophagitis: Secondary | ICD-10-CM | POA: Insufficient documentation

## 2021-02-19 DIAGNOSIS — T7840XA Allergy, unspecified, initial encounter: Secondary | ICD-10-CM | POA: Insufficient documentation

## 2021-02-20 ENCOUNTER — Ambulatory Visit (INDEPENDENT_AMBULATORY_CARE_PROVIDER_SITE_OTHER): Payer: 59 | Admitting: Cardiology

## 2021-02-20 ENCOUNTER — Other Ambulatory Visit: Payer: Self-pay

## 2021-02-20 VITALS — BP 122/68 | HR 95 | Ht 71.0 in | Wt 284.1 lb

## 2021-02-20 DIAGNOSIS — E871 Hypo-osmolality and hyponatremia: Secondary | ICD-10-CM | POA: Insufficient documentation

## 2021-02-20 DIAGNOSIS — N179 Acute kidney failure, unspecified: Secondary | ICD-10-CM

## 2021-02-20 DIAGNOSIS — J309 Allergic rhinitis, unspecified: Secondary | ICD-10-CM | POA: Insufficient documentation

## 2021-02-20 DIAGNOSIS — F4329 Adjustment disorder with other symptoms: Secondary | ICD-10-CM | POA: Insufficient documentation

## 2021-02-20 DIAGNOSIS — I10 Essential (primary) hypertension: Secondary | ICD-10-CM

## 2021-02-20 DIAGNOSIS — G4733 Obstructive sleep apnea (adult) (pediatric): Secondary | ICD-10-CM | POA: Insufficient documentation

## 2021-02-20 DIAGNOSIS — E8881 Metabolic syndrome: Secondary | ICD-10-CM | POA: Insufficient documentation

## 2021-02-20 DIAGNOSIS — G4730 Sleep apnea, unspecified: Secondary | ICD-10-CM

## 2021-02-20 DIAGNOSIS — R0602 Shortness of breath: Secondary | ICD-10-CM | POA: Insufficient documentation

## 2021-02-20 DIAGNOSIS — D693 Immune thrombocytopenic purpura: Secondary | ICD-10-CM | POA: Diagnosis not present

## 2021-02-20 DIAGNOSIS — R0600 Dyspnea, unspecified: Secondary | ICD-10-CM | POA: Insufficient documentation

## 2021-02-20 DIAGNOSIS — U099 Post covid-19 condition, unspecified: Secondary | ICD-10-CM | POA: Insufficient documentation

## 2021-02-20 DIAGNOSIS — K219 Gastro-esophageal reflux disease without esophagitis: Secondary | ICD-10-CM | POA: Insufficient documentation

## 2021-02-20 DIAGNOSIS — R0609 Other forms of dyspnea: Secondary | ICD-10-CM | POA: Insufficient documentation

## 2021-02-20 DIAGNOSIS — D649 Anemia, unspecified: Secondary | ICD-10-CM | POA: Insufficient documentation

## 2021-02-20 DIAGNOSIS — E099 Drug or chemical induced diabetes mellitus without complications: Secondary | ICD-10-CM | POA: Insufficient documentation

## 2021-02-20 MED ORDER — METOPROLOL SUCCINATE ER 50 MG PO TB24: 50.0000 mg | ORAL_TABLET | Freq: Every day | ORAL | 3 refills | Status: DC

## 2021-02-20 MED ORDER — FUROSEMIDE 40 MG PO TABS: 40.0000 mg | ORAL_TABLET | Freq: Every day | ORAL | 3 refills | Status: DC

## 2021-02-20 NOTE — Patient Instructions (Addendum)
Medication Instructions:  Your physician has recommended you make the following change in your medication:   Stop Amlodipine. Start Furosemide 40 mg daily. Start Toprol XL 50 mg daily.   *If you need a refill on your cardiac medications before your next appointment, please call your pharmacy*   Lab Work: Your physician recommends that you have a BMET done today.  If you have labs (blood work) drawn today and your tests are completely normal, you will receive your results only by: MyChart Message (if you have MyChart) OR A paper copy in the mail If you have any lab test that is abnormal or we need to change your treatment, we will call you to review the results.   Testing/Procedures: Your physician has requested that you have an echocardiogram. Echocardiography is a painless test that uses sound waves to create images of your heart. It provides your doctor with information about the size and shape of your heart and how well your heart's chambers and valves are working. This procedure takes approximately one hour. There are no restrictions for this procedure.  Your cardiac CT will be scheduled at one of the below locations:   [facility] [address] [place], [place] [address] [phone]  [facility], please arrive at the [facility] main entrance (entrance A) of [facility] 30 minutes prior to test start time. Proceed to the [facility] Radiology Department (first floor) to check-in and test prep.  If scheduled at [facility], please arrive 15 mins early for check-in and test prep.  Please follow these instructions carefully (unless otherwise directed):  Hold all erectile dysfunction medications at least 3 days (72 hrs) prior to test.  On the Night Before the Test: Be sure to Drink plenty of water. Do not consume any caffeinated/decaffeinated beverages or chocolate 12 hours prior to your test. Do not take any  antihistamines 12 hours prior to your test.   On the Day of the Test: Drink plenty of water until 1 hour prior to the test. Do not eat any food 4 hours prior to the test. You may take your regular medications prior to the test.  Take metoprolol (Lopressor) two hours prior to test. Take 2 tabs of your medication for a total of 100 mg 2 hours prior to your CT scan.       After the Test: Drink plenty of water. After receiving IV contrast, you may experience a mild flushed feeling. This is normal. On occasion, you may experience a mild rash up to 24 hours after the test. This is not dangerous. If this occurs, you can take Benadryl 25 mg and increase your fluid intake. If you experience trouble breathing, this can be serious. If it is severe call 911 IMMEDIATELY. If it is mild, please call our office. If you take any of these medications: Glipizide/Metformin, Avandament, Glucavance, please do not take 48 hours after completing test unless otherwise instructed.   Once we have confirmed authorization from your insurance company, we will call you to set up a date and time for your test. Based on how quickly your insurance processes prior authorizations requests, please allow up to 4 weeks to be contacted for scheduling your Cardiac CT appointment. Be advised that routine Cardiac CT appointments could be scheduled as many as 8 weeks after your provider has ordered it.  For non-scheduling related questions, please contact the cardiac imaging nurse navigator should you have any questions/concerns: Rockwell [place], Cardiac Imaging  Nurse Navigator Larey Brick, Cardiac Imaging Nurse Navigator [facility] Direct Office Dial: [phone]   For scheduling needs, including cancellations and rescheduling, please call [place], [phone].     Follow-Up: At [facility], you and your health needs are our priority.  As part of our continuing mission to provide you with  exceptional heart care, we have created designated Provider Care Teams.  These Care Teams include your primary Cardiologist (physician) and Advanced Practice Providers (APPs -  Physician Assistants and Nurse Practitioners) who all work together to provide you with the care you need, when you need it.  We recommend signing up for the patient portal called "MyChart".  Sign up information is provided on this After Visit Summary.  MyChart is used to connect with patients for Virtual Visits (Telemedicine).  Patients are able to view lab/test results, encounter notes, upcoming appointments, etc.  Non-urgent messages can be sent to your provider as well.   To learn more about what you can do with MyChart, go to [email or web address].    Your next appointment:   1 week(s)  The format for your next appointment:   In Person  Provider:   Belva Crome, MD   Other Instructions NA

## 2021-02-20 NOTE — Progress Notes (Signed)
Cardiology Office Note:    Date:  02/20/2021   ID:  Mike [name], DOB November 23, 1970, MRN [id number]  PCP:  Julianne Handler, NP  Cardiologist:  Garwin Brothers, MD   Referring MD: Julianne Handler, NP    ASSESSMENT:    1. Primary hypertension   2. Immune thrombocytopenic purpura (HCC)   3. Morbid obesity (HCC)   4. Obstructive sleep apnea hypopnea, moderate   5. Sleep apnea, unspecified type   6. Acute kidney injury (HCC)   7. Dyspnea on exertion    PLAN:    In order of problems listed above:  Primary prevention stressed to the patient.  Importance of compliance with diet medication stressed any vocalized understanding. Dyspnea on exertion and pedal edema: I discussed my findings with the patient at extensive length.  He has significant pedal edema.  I will discontinue amlodipine and initiate him on furosemide 40 mg daily.  He will have a Chem-7 today.  In view of tachycardia I will also add Toprol-XL 50 mg daily.  Benefits and potential risks explained to the patient and he vocalized understanding and he will keep a track of his blood pressures and heart rates.  To evaluate the symptoms I will do a CT coronary angiography with FFR to understand if there is any obstructive coronary artery disease element. Cardiac murmur: Echocardiogram will be done to assess murmur heard on auscultation. Essential hypertension: Blood pressure stable and diet was emphasized.  Lifestyle modification urged about modifications were done to his diet. Mixed dyslipidemia and diabetes mellitus: Managed by primary care. Sleep apnea: Sleep health issues were discussed. Morbid obesity: Weight reduction was stressed.  Diet emphasized.  He will be seen in follow-up appointment in 8 to 10 days or earlier if he has any concerns.  He and his sister had multiple questions which were answered to their satisfaction.   Medication Adjustments/Labs and Tests Ordered: Current medicines are reviewed at length with the  patient today.  Concerns regarding medicines are outlined above.  No orders of the defined types were placed in this encounter.  No orders of the defined types were placed in this encounter.    History of Present Illness:    Mike [name] is a 50 y.o. male who is being seen today for the evaluation of dyspnea on exertion at the request of Julianne Handler, NP.  Patient is a pleasant 50 year old male.  He has past medical history of essential hypertension, morbid obesity and sleep apnea.  Patient was admitted to the hospital and treated.  He tells me that he had COVID pneumonitis.  Subsequently he has recovered.  He has significant bilateral pedal edema.  He tells me that he has renal insufficiency but his last blood work on 02/13/2021 is unremarkable as far as kidney function is concerned.  He has significant bilateral pedal edema and he does 12-hour shifts and this gets really worse at evening hours.  At the time of my evaluation, the patient is alert awake oriented and in no distress.  He has been kept off work by his primary care provider.  Past Medical History:  Diagnosis Date   Acute kidney injury (HCC)    Allergic rhinitis with postnasal drip    Allergy    Anemia    Dyspnea on exertion    GERD (gastroesophageal reflux disease)    GERD without esophagitis    Hypertension    Hyponatremia    Immune thrombocytopenic purpura (HCC)    Immune  thrombocytopenic purpura (HCC)    Metabolic syndrome    Morbid obesity (HCC)    Obstructive sleep apnea hypopnea, moderate    Post-COVID syndrome    Shortness of breath    Sleep apnea    wears a mouth piece   Steroid-induced diabetes (HCC)    Stress and adjustment reaction    Thrombocytopenia, unspecified (HCC)     Past Surgical History:  Procedure Laterality Date   NO PAST SURGERIES      Current Medications: Current Meds  Medication Sig   albuterol (VENTOLIN HFA) 108 (90 Base) MCG/ACT inhaler Inhale 2 puffs into the lungs every 4  (four) hours as needed for wheezing or shortness of breath.   amLODipine (NORVASC) 10 MG tablet Take 10 mg by mouth daily.   Ascorbic Acid (VITAMIN C) 1000 MG tablet Take 1,000 mg by mouth daily.   benazepril (LOTENSIN) 40 MG tablet Take 40 mg by mouth daily.   cholecalciferol (VITAMIN D3) 25 MCG (1000 UNIT) tablet Take 1,000 Units by mouth daily.   escitalopram (LEXAPRO) 10 MG tablet Take 10 mg by mouth daily.   glipiZIDE (GLUCOTROL XL) 10 MG 24 hr tablet Take 10 mg by mouth daily.   metFORMIN (GLUCOPHAGE-XR) 500 MG 24 hr tablet Take 1,000 mg by mouth 2 (two) times daily.   omeprazole (PRILOSEC) 20 MG capsule Take 20 mg by mouth 4 (four) times daily.   potassium chloride SA (KLOR-CON) 20 MEQ tablet Take 20 mEq by mouth 3 (three) times daily.   predniSONE (DELTASONE) 5 MG tablet Take 1 tablet (5 mg total) by mouth 2 (two) times daily with a meal.   sildenafil (VIAGRA) 100 MG tablet Take 100 mg by mouth daily as needed for erectile dysfunction.   vitamin B-12 (CYANOCOBALAMIN) 1000 MCG tablet Take 1,000 mcg by mouth daily.   vitamin k 100 MCG tablet Take 100 mcg by mouth daily.     Allergies:   Patient has no known allergies.   Social History   Socioeconomic History   Marital status: Divorced    Spouse name: Not on file   Number of children: 1   Years of education: Not on file   Highest education level: Not on file  Occupational History   Not on file  Tobacco Use   Smoking status: Never   Smokeless tobacco: Former  Substance and Sexual Activity   Alcohol use: Not Currently   Drug use: Not Currently   Sexual activity: Not Currently  Other Topics Concern   Not on file  Social History Narrative   Not on file   Social Determinants of Health   Financial Resource Strain: Not on file  Food Insecurity: Not on file  Transportation Needs: Not on file  Physical Activity: Not on file  Stress: Not on file  Social Connections: Not on file     Family History: The patient's family  history includes Breast cancer in his maternal grandmother; Heart attack in his father, paternal grandfather, and paternal uncle; Thrombosis in his maternal aunt, maternal grandmother, and maternal uncle.  ROS:   Please see the history of present illness.    All other systems reviewed and are negative.  EKGs/Labs/Other Studies Reviewed:    The following studies were reviewed today: I discussed my findings with the patient in extensive length.  EKG reveals sinus rhythm and nonspecific ST-T changes   Recent Labs: 02/13/2021: ALT 18; BUN 11; Creatinine 0.8; Hemoglobin 12.2; Platelets 122; Potassium 3.1; Sodium 142  Recent Lipid Panel No  results found for: CHOL, TRIG, HDL, CHOLHDL, VLDL, LDLCALC, LDLDIRECT  Physical Exam:    VS:  BP 122/68   Pulse 95   Ht 5\' 11"  (1.803 m)   Wt 284 lb 1.3 oz (128.9 kg)   SpO2 98%   BMI 39.62 kg/m     Wt Readings from Last 3 Encounters:  02/20/21 284 lb 1.3 oz (128.9 kg)  02/13/21 286 lb 12.8 oz (130.1 kg)  01/16/21 297 lb 14.4 oz (135.1 kg)     GEN: Patient is in no acute distress HEENT: Normal NECK: No JVD; No carotid bruits LYMPHATICS: No lymphadenopathy CARDIAC: S1 S2 regular, 2/6 systolic murmur at the apex. RESPIRATORY:  Clear to auscultation without rales, wheezing or rhonchi  ABDOMEN: Soft, non-tender, non-distended MUSCULOSKELETAL: 2-3+ pedal edema; No deformity  SKIN: Warm and dry NEUROLOGIC:  Alert and oriented x 3 PSYCHIATRIC:  Normal affect    Signed, 01/18/21, MD  02/20/2021 3:16 PM    [facility]

## 2021-02-20 NOTE — Addendum Note (Signed)
Addended by: Eleonore Chiquito on: 02/20/2021 04:13 PM   Modules accepted: Orders

## 2021-02-21 ENCOUNTER — Encounter: Payer: Self-pay | Admitting: Oncology

## 2021-02-21 LAB — BASIC METABOLIC PANEL
BUN/Creatinine Ratio: 8 — ABNORMAL LOW (ref 9–20)
BUN: 6 mg/dL (ref 6–24)
CO2: 24 mmol/L (ref 20–29)
Calcium: 8.1 mg/dL — ABNORMAL LOW (ref 8.7–10.2)
Chloride: 101 mmol/L (ref 96–106)
Creatinine, Ser: 0.78 mg/dL (ref 0.76–1.27)
Glucose: 87 mg/dL (ref 65–99)
Potassium: 3.4 mmol/L — ABNORMAL LOW (ref 3.5–5.2)
Sodium: 145 mmol/L — ABNORMAL HIGH (ref 134–144)
eGFR: 109 mL/min/{1.73_m2} (ref >59)

## 2021-02-21 MED ORDER — POTASSIUM CHLORIDE CRYS ER 20 MEQ PO TBCR: EXTENDED_RELEASE_TABLET | ORAL | Status: DC

## 2021-02-21 NOTE — Addendum Note (Signed)
Addended by: Eleonore Chiquito on: 02/21/2021 10:32 AM   Modules accepted: Orders

## 2021-02-26 NOTE — Progress Notes (Signed)
[facility]  [address] [place],  [place]  [address] [phone]  Clinic Day:  02/27/2021  Referring physician: Julianne Handler, NP   CHIEF COMPLAINT:  CC:  Immune thrombocytopenic purpura  Current Treatment:   Prednisone 5 mg daily   HISTORY OF PRESENT ILLNESS:  Mike [name] is a 50 y.o. male with a history of severe thrombocytopenia, felt to represent immune thrombocytopenic purpura.  He has had thrombocytopenia, which has fluctuated up and down since June 2020.  Further laboratory evaluation did not reveal any other specific etiology for his thrombocytopenia.  He had mildly elevated liver transaminases.  Abdominal ultrasound revealed increased echogenicity of hepatic parenchyma suggesting hepatic steatosis, with probable fatty sparing adjacent to gallbladder fossa.  No other abnormality was seen.  We recommended bone marrow examination, but the patient and his mother declined.  The platelet count continued to fluctuate up and down, but as his platelet count remained 25,000 in June 2021, he was started on prednisone 20 mg 3 times daily, with a good response.  He has had leukocytosis secondary to the high-dose prednisone.  He also has had oral candidiasis on occasion. Mike [name] titers were elevated, so this may be representative of chronic fatigue syndrome. We decreased his prednisone to 45 mg in late September and platelets remained stable at 97,000.  Since we were unable to taper him off the prednisone he was treated with rituximab weekly for 4 weeks in October and November.  We have been slowly tapering the prednisone.  His platelet count has remained above 75,000 despite decreasing the prednisone down to 30 mg daily in February, but he has been hesitant to decrease the prednisone further. 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  He has also had hypokalemia resolved with oral supplementation.   More recently, he developed lower extremity edema and blurry vision felt to be secondary to prednisone. At his visit on March 9th, his platelets were up to 98,000, so we decreased the prednisone to 25 mg daily.  He had redness, irritation and morning discharge from his eyes, so was treated for conjunctivitis with erythromycin ointment.  On April 6th, his platelets were stable at 100,000, so we decreased prednisone to 20 mg daily.  His platelets remain stable at 98,000 on April 20th, so we reduced the prednisone to 15 mg daily.  He was seen on May 11th, as he had been ill the week before with a diarrheal illness and his platelet count was 179,000, so I decreased his prednisone to 10 mg daily.  He had recurrent bilateral conjunctivitis, so I placed him back on erythromycin ophthalmic ointment daily again.  On May 18th, his platelet count was 151,000, so I decreased his prednisone to 5 mg daily. He was admitted with COVID-19 on May 24th after leaving work earlier due to altered mental status and generalized weakness.  He simply had not felt well for several days, so stayed in bed without eating or drinking, then attempted to go back to work on schedule.  He had acute kidney injury with a creatinine of 2.2, which normalized with IV hydration. He had fever, but no respiratory symptoms. He was discharged on a prolonged prednisone taper over 15 weeks.  He saw his primary care provider after discharge from the hospital and she stopped his indapamide, and decreased his potassium chloride 20 mEq to once a day.  The patient continued potassium chloride 20 mEq, 2 tablets daily,  as he was previously on 3 tablets daily.  He had been having some chest pain since discontinuing indapamide, so was referred to Dr. Tomie China in Cardiology.   INTERVAL HISTORY:  Mike [name] is here today for repeat clinical assessment and states he continues his prednisone taper without difficulty.  He states he will be down to 5 mg daily in 2-3 weeks.   He saw Dr. Tomie China who placed him on furosemide 40 mg daily and metoprolol 50 mg daily, as well as discontinued amlodipine.  He also had him increase his potassium to 40 mg in the morning and at noon with 20 mg in the evening.  He states he is taking the potassium 40 mg twice daily, as he was concerned his potassium might get too high.  He requests a refill of his potassium today. He states his lower extremity edema has resolved with the furosemide and overall he is feeling better.  He continues to report dyspnea with exertion, but denies further episodes of chest pain.  He states his energy level is better. He denies fevers or chills. He denies pain. His appetite is good. His weight has decreased 8 pounds in 2 weeks. He is scheduled for an echocardiogram and a CT coronary angiography on July 12th.  REVIEW OF SYSTEMS:  Review of Systems  Constitutional:  Negative for appetite change, chills, fatigue, fever and unexpected weight change.  HENT:   Negative for lump/mass, mouth sores and sore throat.   Respiratory:  Negative for cough and shortness of breath.   Cardiovascular:  Negative for chest pain and leg swelling.  Gastrointestinal:  Negative for abdominal pain, constipation, diarrhea, nausea and vomiting.  Genitourinary:  Negative for difficulty urinating, dysuria, frequency and hematuria.   Musculoskeletal:  Negative for arthralgias, back pain and myalgias.  Skin:  Negative for itching, rash and wound.  Neurological:  Negative for dizziness, extremity weakness, headaches, light-headedness and numbness.  Hematological:  Negative for adenopathy.  Psychiatric/Behavioral:  Negative for depression and sleep disturbance. The patient is not nervous/anxious.    VITALS:  Blood pressure 112/69, pulse 84, temperature 98.7 F (37.1 C), temperature source Oral, resp. rate 16, height 5\' 11"  (1.803 m), weight 276 lb 1.6 oz (125.2 kg), SpO2 96 %.  Wt Readings from Last 3 Encounters:  02/27/21 276 lb 1.6 oz  (125.2 kg)  02/20/21 284 lb 1.3 oz (128.9 kg)  02/13/21 286 lb 12.8 oz (130.1 kg)    Body mass index is 38.51 kg/m.  Performance status (ECOG): 1 - Symptomatic but completely ambulatory  PHYSICAL EXAM:  Physical Exam Vitals and nursing note reviewed.  Constitutional:      General: He is not in acute distress.    Appearance: Normal appearance. He is normal weight.  HENT:     Head: Normocephalic and atraumatic.     Mouth/Throat:     Mouth: Mucous membranes are moist.     Pharynx: Oropharynx is clear. No oropharyngeal exudate or posterior oropharyngeal erythema.  Eyes:     General: No scleral icterus.    Extraocular Movements: Extraocular movements intact.     Conjunctiva/sclera: Conjunctivae normal.     Pupils: Pupils are equal, round, and reactive to light.  Cardiovascular:     Rate and Rhythm: Normal rate and regular rhythm.     Heart sounds: Normal heart sounds. No murmur heard.   No friction rub. No gallop.  Pulmonary:     Effort: Pulmonary effort is normal.     Breath sounds: Normal breath sounds. No  wheezing, rhonchi or rales.  Chest:  Breasts:    Right: No axillary adenopathy or supraclavicular adenopathy.     Left: No axillary adenopathy or supraclavicular adenopathy.  Abdominal:     General: Bowel sounds are normal. There is no distension.     Palpations: Abdomen is soft. There is no hepatomegaly, splenomegaly or mass.     Tenderness: There is no abdominal tenderness.  Musculoskeletal:        General: Normal range of motion.     Cervical back: Normal range of motion and neck supple. No tenderness.     Right lower leg: Edema (trace) present.     Left lower leg: Edema (trace) present.  Lymphadenopathy:     Cervical: No cervical adenopathy.     Upper Body:     Right upper body: No supraclavicular or axillary adenopathy.     Left upper body: No supraclavicular or axillary adenopathy.     Lower Body: No right inguinal adenopathy. No left inguinal adenopathy.   Skin:    General: Skin is warm and dry.     Coloration: Skin is not jaundiced.     Findings: No rash.  Neurological:     Mental Status: He is alert and oriented to person, place, and time.     Cranial Nerves: No cranial nerve deficit.  Psychiatric:        Mood and Affect: Mood normal.        Behavior: Behavior normal.        Thought Content: Thought content normal.   LABS:   CBC Latest Ref Rng & Units 02/27/2021 02/13/2021 01/16/2021  WBC - 13.5 11.2 13.7  Hemoglobin 13.5 - 17.5 13.4(A) 12.2(A) 13.6  Hematocrit 41 - 53 40(A) 36(A) 41  Platelets 150 - 399 170 122(A) 151   CMP Latest Ref Rng & Units 02/27/2021 02/20/2021 02/13/2021  Glucose 65 - 99 mg/dL - 87 -  BUN 4 - 21 13 6 11   Creatinine 0.6 - 1.3 0.9 0.78 0.8  Sodium 137 - 147 142 145(H) 142  Potassium 3.4 - 5.3 4.0 3.4(L) 3.1(A)  Chloride 99 - 108 102 101 104  CO2 13 - 22 27(A) 24 27(A)  Calcium 8.7 - 10.7 8.0(A) 8.1(L) 7.7(A)  Alkaline Phos 25 - 125 68 - 57  AST 14 - 40 19 - 20  ALT 10 - 40 18 - 18     No results found for: CEA1 / No results found for: CEA1 No results found for: PSA1 No results found for: No results found for: CAN125  No results found for: TOTALPROTELP, ALBUMINELP, A1GS, A2GS, BETS, BETA2SER, GAMS, MSPIKE, SPEI No results found for: TIBC, FERRITIN, IRONPCTSAT No results found for: LDH  STUDIES:  No results found.    HISTORY:   Past Medical History:  Diagnosis Date   Acute kidney injury (HCC)    Allergic rhinitis with postnasal drip    Allergy    Anemia    Dyspnea on exertion    GERD (gastroesophageal reflux disease)    GERD without esophagitis    Hypertension    Hyponatremia    Immune thrombocytopenic purpura (HCC)    Immune thrombocytopenic purpura (HCC)    Metabolic syndrome    Morbid obesity (HCC)    Obstructive sleep apnea hypopnea, moderate    Post-COVID syndrome    Shortness of breath    Sleep apnea    wears a mouth piece   Steroid-induced diabetes (HCC)    Stress  and adjustment  reaction    Thrombocytopenia, unspecified (HCC)     Past Surgical History:  Procedure Laterality Date   NO PAST SURGERIES      Family History  Problem Relation Age of Onset   Heart attack Father    Thrombosis Maternal Aunt        fatal   Thrombosis Maternal Uncle        fatal   Heart attack Paternal Uncle    Breast cancer Maternal Grandmother        80s   Thrombosis Maternal Grandmother        fatal   Heart attack Paternal Grandfather     Social History:  reports that he has never smoked. He has quit using smokeless tobacco. He reports previous alcohol use. He reports previous drug use.The patient is accompanied by his sister today.  Allergies: No Known Allergies  Current Medications: Current Outpatient Medications  Medication Sig Dispense Refill   albuterol (VENTOLIN HFA) 108 (90 Base) MCG/ACT inhaler Inhale 2 puffs into the lungs every 4 (four) hours as needed for wheezing or shortness of breath.     Ascorbic Acid (VITAMIN C) 1000 MG tablet Take 1,000 mg by mouth daily.     benazepril (LOTENSIN) 40 MG tablet Take 40 mg by mouth daily.     cholecalciferol (VITAMIN D3) 25 MCG (1000 UNIT) tablet Take 1,000 Units by mouth daily.     escitalopram (LEXAPRO) 10 MG tablet Take 10 mg by mouth daily.     furosemide (LASIX) 40 MG tablet Take 1 tablet (40 mg total) by mouth daily. 90 tablet 3   glipiZIDE (GLUCOTROL XL) 10 MG 24 hr tablet Take 10 mg by mouth daily.     metFORMIN (GLUCOPHAGE-XR) 500 MG 24 hr tablet Take 1,000 mg by mouth 2 (two) times daily.     metoprolol succinate (TOPROL-XL) 50 MG 24 hr tablet Take 1 tablet (50 mg total) by mouth daily. Take with or immediately following a meal. 90 tablet 3   omeprazole (PRILOSEC) 20 MG capsule Take 20 mg by mouth 4 (four) times daily.     potassium chloride SA (KLOR-CON) 20 MEQ tablet Take 2 tabs twice daily 120 tablet 2   predniSONE (DELTASONE) 5 MG tablet Take 1 tablet (5 mg total) by mouth 2 (two) times daily with  a meal. 100 tablet 1   sildenafil (VIAGRA) 100 MG tablet Take 100 mg by mouth daily as needed for erectile dysfunction.     vitamin B-12 (CYANOCOBALAMIN) 1000 MCG tablet Take 1,000 mcg by mouth daily.     vitamin k 100 MCG tablet Take 100 mcg by mouth daily.     No current facility-administered medications for this visit.     ASSESSMENT & PLAN:   Assessment:  1. Significant thrombocytopenia, which has been fluctuating up and down since June 2020.  This is felt to represent immune thrombocytopenic purpura.  We placed him on prednisone 20 mg three times daily in early June.  We were unable to taper his steroids, so he underwent rituximab therapy weekly for 4 weeks in October 2021 with a partial response.  He is not a good candidate for splenectomy unless absolutely necessary.  His platelet count has remained above 75,000, we have steadily tapered his prednisone and prior to his recent hospital admission, he was on prednisone  daily. He is currently on high-dose prednisone taper, so we will simply monitor him until his prednisone dose gets back to 5 mg daily. His platelets are mildly low  today.   2. Leukocytosis, secondary to high dose steroids, which has worsened with the increase prednisone dose.     3. Hypertension, secondary to chronic prednisone , which is controlled.      4.  Mild hypokalemia with decreasing the potassium chloride 20 meq  to 2 times daily. I will have him resume potassium chloride 20 mEq 3 times daily.   5. Steroid induced diabetes, for which he continues metformin 500 mg twice daily and glipizide 5 mg daily.     6.  Elevated Epstein Barr titers.  This may be indicative of chronic fatigue syndrome, and I advised that he rest as needed.  We had a discussion about this diagnosis.  His sister has him taking zinc, lemon balm, and L lysine.   7.  Generalized weakness consistent with steroid myopathy, which is stable.   8.  Sleep apnea, untreated. I have encouraged him to  use his CPAP mask for better quality of life and to reduce his daytime sleepiness.    9.  Lower extremity edema, felt to be due to chronic steroid use, basically resolved with furosemide.   10.  Chronic anxiety, mainly related to his health.  11.  Recent admission with acute kidney injury due to dehydration associated with COVID-19.   He is on a 15 week prednisone taper.  I advised him that when his prednisone dose gets to 5 mg daily, he continue that until his next appointment with us.  12.   New onset chest pain, he is being evaluated by Dr. Tomie Chinaevankar and will have further studies on July 12th.  His symptoms have improved with furosemide and metoprolol.   Plan:    He will continue the prednisone taper as prescribed by the hospitalist. I will plan to see him back in 3 weeks with a CBC and comprehensive metabolic panel. The patient and his sister understand the plans discussed today and are in agreement with them. They know to contact our office if he develops concerns prior to his next appointment.     Adah PerlKelli A Dino Borntreger, PA-C

## 2021-02-27 ENCOUNTER — Encounter: Payer: Self-pay | Admitting: Hematology and Oncology

## 2021-02-27 ENCOUNTER — Inpatient Hospital Stay (INDEPENDENT_AMBULATORY_CARE_PROVIDER_SITE_OTHER): Payer: 59 | Admitting: Hematology and Oncology

## 2021-02-27 ENCOUNTER — Telehealth: Payer: Self-pay | Admitting: Hematology and Oncology

## 2021-02-27 ENCOUNTER — Other Ambulatory Visit: Payer: Self-pay

## 2021-02-27 ENCOUNTER — Ambulatory Visit: Payer: 59

## 2021-02-27 ENCOUNTER — Inpatient Hospital Stay: Payer: 59

## 2021-02-27 VITALS — BP 104/74 | HR 94 | Ht 71.0 in | Wt 275.0 lb

## 2021-02-27 VITALS — BP 112/69 | HR 84 | Temp 98.7°F | Resp 16 | Ht 71.0 in | Wt 276.1 lb

## 2021-02-27 DIAGNOSIS — D693 Immune thrombocytopenic purpura: Secondary | ICD-10-CM

## 2021-02-27 DIAGNOSIS — I10 Essential (primary) hypertension: Secondary | ICD-10-CM

## 2021-02-27 LAB — CBC AND DIFFERENTIAL
HCT: 40 — AB (ref 41–53)
Hemoglobin: 13.4 — AB (ref 13.5–17.5)
Neutrophils Absolute: 10.26
Platelets: 170 (ref 150–399)
WBC: 13.5

## 2021-02-27 LAB — HEPATIC FUNCTION PANEL
ALT: 18 (ref 10–40)
AST: 19 (ref 14–40)
Alkaline Phosphatase: 68 (ref 25–125)
Bilirubin, Total: 0.9

## 2021-02-27 LAB — BASIC METABOLIC PANEL
BUN: 13 (ref 4–21)
CO2: 27 — AB (ref 13–22)
Chloride: 102 (ref 99–108)
Creatinine: 0.9 (ref 0.6–1.3)
Glucose: 104
Potassium: 4 (ref 3.4–5.3)
Sodium: 142 (ref 137–147)

## 2021-02-27 LAB — CBC: RBC: 4.87 (ref 3.87–5.11)

## 2021-02-27 LAB — COMPREHENSIVE METABOLIC PANEL
Albumin: 4.6 (ref 3.5–5.0)
Calcium: 8 — AB (ref 8.7–10.7)

## 2021-02-27 MED ORDER — POTASSIUM CHLORIDE CRYS ER 20 MEQ PO TBCR: EXTENDED_RELEASE_TABLET | ORAL | 2 refills | Status: DC

## 2021-02-27 NOTE — Progress Notes (Signed)
Reason for visit: nurse visit for BP check   Name of MD requesting visit: Revankar  H&P: hypertension  ROS related to problem: hypertension and leg swelling. Recent medication changes  Assessment and plan per MD: No changes at this time. Pt verbalized that he feels better and that his swelling has decreased in his legs a lot.

## 2021-02-27 NOTE — Telephone Encounter (Signed)
Per 6/29 LOS, patient scheduled for July Appt's.  Gave patient Appt Summary 

## 2021-02-28 ENCOUNTER — Other Ambulatory Visit: Payer: Self-pay | Admitting: Hematology and Oncology

## 2021-02-28 ENCOUNTER — Encounter: Payer: Self-pay | Admitting: Oncology

## 2021-02-28 ENCOUNTER — Other Ambulatory Visit: Payer: Self-pay

## 2021-02-28 MED ORDER — POTASSIUM CHLORIDE CRYS ER 20 MEQ PO TBCR: 40.0000 meq | EXTENDED_RELEASE_TABLET | Freq: Two times a day (BID) | ORAL | 2 refills | Status: DC

## 2021-02-28 MED ORDER — POTASSIUM CHLORIDE CRYS ER 20 MEQ PO TBCR: EXTENDED_RELEASE_TABLET | ORAL | 2 refills | Status: DC

## 2021-03-01 ENCOUNTER — Encounter: Payer: Self-pay | Admitting: Oncology

## 2021-03-07 ENCOUNTER — Encounter: Payer: Self-pay | Admitting: Cardiology

## 2021-03-07 ENCOUNTER — Ambulatory Visit (INDEPENDENT_AMBULATORY_CARE_PROVIDER_SITE_OTHER): Payer: 59 | Admitting: Cardiology

## 2021-03-07 ENCOUNTER — Other Ambulatory Visit: Payer: Self-pay

## 2021-03-07 VITALS — BP 114/68 | HR 92 | Ht 71.0 in | Wt 270.0 lb

## 2021-03-07 DIAGNOSIS — I10 Essential (primary) hypertension: Secondary | ICD-10-CM

## 2021-03-07 DIAGNOSIS — G4733 Obstructive sleep apnea (adult) (pediatric): Secondary | ICD-10-CM

## 2021-03-07 DIAGNOSIS — G4730 Sleep apnea, unspecified: Secondary | ICD-10-CM

## 2021-03-07 MED ORDER — METOPROLOL SUCCINATE ER 100 MG PO TB24
100.0000 mg | ORAL_TABLET | Freq: Every day | ORAL | 3 refills | Status: DC
Start: 2021-03-07 — End: 2021-12-23

## 2021-03-07 MED ORDER — BENAZEPRIL HCL 20 MG PO TABS: 20.0000 mg | ORAL_TABLET | Freq: Every day | ORAL | 3 refills | Status: DC

## 2021-03-07 NOTE — Progress Notes (Signed)
Cardiology Office Note:    Date:  03/07/2021   ID:  [name], DOB 06-Mar-1971, MRN [id number]  PCP:  Julianne Handler, NP  Cardiologist:  Garwin Brothers, MD   Referring MD: Julianne Handler, NP    ASSESSMENT:    1. Primary hypertension   2. Obstructive sleep apnea hypopnea, moderate   3. Sleep apnea, unspecified type    PLAN:    In order of problems listed above:  Primary prevention stressed with patient.  Importance of compliance with diet medication stressed any vocalized understanding. Essential hypertension: Patient is very happy with his blood pressures.  His pedal edema is resolved significantly after stopping amlodipine.  He is tachycardic to some extent and I will increase his Toprol to 100 mg a day.  I will also decrease benazepril as his blood pressure is borderline.  I reviewed blood work done at [facility]. Obesity: Weight reduction was stressed and diet was emphasized. Cardiac murmur: Echocardiogram will be done and is pending.  Also CT calcium scoring is also pending and I will discuss this with him in follow-up appointment in 6 weeks.  Importance of diet and weight reduction stressed risks of obesity explained and he promises to comply and do better.   Medication Adjustments/Labs and Tests Ordered: Current medicines are reviewed at length with the patient today.  Concerns regarding medicines are outlined above.  No orders of the defined types were placed in this encounter.  No orders of the defined types were placed in this encounter.    No chief complaint on file.    History of Present Illness:    Mike [name] is a 50 y.o. male.  Patient has past medical history of essential hypertension obesity and obstructive sleep apnea.  He denies any problems at this time and takes care of activities of daily living.  No chest pain orthopnea or PND.  He is a diabetic.  He is very happy with the patient's blood pressure.  His blood pressure is very well  controlled now and his pedal edema is resolved.  At the time of my evaluation, the patient is alert awake oriented and in no distress.  Past Medical History:  Diagnosis Date   Acute kidney injury (HCC)    Allergic rhinitis with postnasal drip    Allergy    Anemia    Dyspnea on exertion    GERD (gastroesophageal reflux disease)    GERD without esophagitis    Hypertension    Hyponatremia    Immune thrombocytopenic purpura (HCC)    Immune thrombocytopenic purpura (HCC)    Metabolic syndrome    Morbid obesity (HCC)    Obstructive sleep apnea hypopnea, moderate    Post-COVID syndrome    Shortness of breath    Sleep apnea    wears a mouth piece   Steroid-induced diabetes (HCC)    Stress and adjustment reaction    Thrombocytopenia, unspecified (HCC)     Past Surgical History:  Procedure Laterality Date   NO PAST SURGERIES      Current Medications: Current Meds  Medication Sig   albuterol (VENTOLIN HFA) 108 (90 Base) MCG/ACT inhaler Inhale 2 puffs into the lungs every 4 (four) hours as needed for wheezing or shortness of breath.   Ascorbic Acid (VITAMIN C) 1000 MG tablet Take 1,000 mg by mouth daily.   benazepril (LOTENSIN) 40 MG tablet Take 40 mg by mouth daily.   cholecalciferol (VITAMIN D3) 25 MCG (1000 UNIT) tablet Take 1,000  Units by mouth daily.   escitalopram (LEXAPRO) 10 MG tablet Take 10 mg by mouth daily.   furosemide (LASIX) 40 MG tablet Take 1 tablet (40 mg total) by mouth daily.   glipiZIDE (GLUCOTROL XL) 10 MG 24 hr tablet Take 10 mg by mouth daily.   metFORMIN (GLUCOPHAGE-XR) 500 MG 24 hr tablet Take 1,000 mg by mouth 2 (two) times daily.   metoprolol succinate (TOPROL-XL) 50 MG 24 hr tablet Take 1 tablet (50 mg total) by mouth daily. Take with or immediately following a meal.   omeprazole (PRILOSEC) 20 MG capsule Take 20 mg by mouth 4 (four) times daily.   potassium chloride SA (KLOR-CON) 20 MEQ tablet Take 2 tablets (40 mEq total) by mouth 2 (two) times daily.    predniSONE (DELTASONE) 5 MG tablet Take 1 tablet (5 mg total) by mouth 2 (two) times daily with a meal.   sildenafil (VIAGRA) 100 MG tablet Take 100 mg by mouth daily as needed for erectile dysfunction.   vitamin B-12 (CYANOCOBALAMIN) 1000 MCG tablet Take 1,000 mcg by mouth daily.   vitamin k 100 MCG tablet Take 100 mcg by mouth daily.     Allergies:   Patient has no known allergies.   Social History   Socioeconomic History   Marital status: Divorced    Spouse name: Not on file   Number of children: 1   Years of education: Not on file   Highest education level: Not on file  Occupational History   Not on file  Tobacco Use   Smoking status: Never   Smokeless tobacco: Former  Substance and Sexual Activity   Alcohol use: Not Currently   Drug use: Not Currently   Sexual activity: Not Currently  Other Topics Concern   Not on file  Social History Narrative   Not on file   Social Determinants of Health   Financial Resource Strain: Not on file  Food Insecurity: Not on file  Transportation Needs: Not on file  Physical Activity: Not on file  Stress: Not on file  Social Connections: Not on file     Family History: The patient's family history includes Breast cancer in his maternal grandmother; Heart attack in his father, paternal grandfather, and paternal uncle; Thrombosis in his maternal aunt, maternal grandmother, and maternal uncle.  ROS:   Please see the history of present illness.    All other systems reviewed and are negative.  EKGs/Labs/Other Studies Reviewed:    The following studies were reviewed today: I discussed findings of blood pressure with patient at length   Recent Labs: 02/27/2021: ALT 18; BUN 13; Creatinine 0.9; Hemoglobin 13.4; Platelets 170; Potassium 4.0; Sodium 142  Recent Lipid Panel No results found for: CHOL, TRIG, HDL, CHOLHDL, VLDL, LDLCALC, LDLDIRECT  Physical Exam:    VS:  BP 114/68   Pulse 92   Ht 5\' 11"  (1.803 m)   Wt 270 lb (122.5 kg)    SpO2 98%   BMI 37.66 kg/m     Wt Readings from Last 3 Encounters:  03/07/21 270 lb (122.5 kg)  02/27/21 275 lb (124.7 kg)  02/27/21 276 lb 1.6 oz (125.2 kg)     GEN: Patient is in no acute distress HEENT: Normal NECK: No JVD; No carotid bruits LYMPHATICS: No lymphadenopathy CARDIAC: Hear sounds regular, 2/6 systolic murmur at the apex. RESPIRATORY:  Clear to auscultation without rales, wheezing or rhonchi  ABDOMEN: Soft, non-tender, non-distended MUSCULOSKELETAL:  No edema; No deformity  SKIN: Warm and dry NEUROLOGIC:  Alert and oriented x 3 PSYCHIATRIC:  Normal affect   Signed, Garwin Brothers, MD  03/07/2021 1:28 PM    [facility]

## 2021-03-07 NOTE — Patient Instructions (Signed)
Medication Instructions:  Your physician has recommended you make the following change in your medication:   Decrease your Benazepril to 20 mg daily. Take 1/2 tablet of your current dose until your new prescription is needed.  Increase your Metoprolol succinate to 100 mg daily. Double your current dose until you need your new prescription.  *If you need a refill on your cardiac medications before your next appointment, please call your pharmacy*   Lab Work: None ordered If you have labs (blood work) drawn today and your tests are completely normal, you will receive your results only by: MyChart Message (if you have MyChart) OR A paper copy in the mail If you have any lab test that is abnormal or we need to change your treatment, we will call you to review the results.   Testing/Procedures: None ordered   Follow-Up: At [facility], you and your health needs are our priority.  As part of our continuing mission to provide you with exceptional heart care, we have created designated Provider Care Teams.  These Care Teams include your primary Cardiologist (physician) and Advanced Practice Providers (APPs -  Physician Assistants and Nurse Practitioners) who all work together to provide you with the care you need, when you need it.  We recommend signing up for the patient portal called "MyChart".  Sign up information is provided on this After Visit Summary.  MyChart is used to connect with patients for Virtual Visits (Telemedicine).  Patients are able to view lab/test results, encounter notes, upcoming appointments, etc.  Non-urgent messages can be sent to your provider as well.   To learn more about what you can do with MyChart, go to [email or web address].    Your next appointment:   6 week(s)  The format for your next appointment:   In Person  Provider:   Belva Crome, MD   Other Instruction:  For non-scheduling related questions, please contact the cardiac imaging nurse  navigator should you have any questions/concerns: Rockwell Alexandria, Cardiac Imaging Nurse Navigator Larey Brick, Cardiac Imaging Nurse Navigator [facility] Direct Office Dial: [phone]   For scheduling needs, including cancellations and rescheduling, please call [place], [phone].

## 2021-03-12 ENCOUNTER — Ambulatory Visit (INDEPENDENT_AMBULATORY_CARE_PROVIDER_SITE_OTHER): Payer: 59

## 2021-03-12 ENCOUNTER — Telehealth (HOSPITAL_COMMUNITY): Payer: Self-pay | Admitting: Emergency Medicine

## 2021-03-12 ENCOUNTER — Other Ambulatory Visit: Payer: Self-pay

## 2021-03-12 DIAGNOSIS — R0609 Other forms of dyspnea: Secondary | ICD-10-CM

## 2021-03-12 DIAGNOSIS — R0600 Dyspnea, unspecified: Secondary | ICD-10-CM | POA: Diagnosis not present

## 2021-03-12 LAB — ECHOCARDIOGRAM COMPLETE
Area-P 1/2: 3.65 cm2
S' Lateral: 3.7 cm

## 2021-03-12 NOTE — Telephone Encounter (Signed)
Attempted to call patient regarding upcoming cardiac CT appointment. °Left message on voicemail with name and callback number °Kasheena Sambrano RN Navigator Cardiac Imaging °[facility] °[phone] Office °[phone] Cell ° °

## 2021-03-12 NOTE — Progress Notes (Signed)
Complete echocardiogram performed.  Jimmy Jered Heiny RDCS, RVT  

## 2021-03-13 ENCOUNTER — Ambulatory Visit: Payer: 59 | Admitting: Hematology and Oncology

## 2021-03-13 ENCOUNTER — Telehealth: Payer: Self-pay | Admitting: Oncology

## 2021-03-13 ENCOUNTER — Other Ambulatory Visit: Payer: 59

## 2021-03-13 NOTE — Telephone Encounter (Signed)
Patient's sister called to verify 7/21 Appt's

## 2021-03-14 ENCOUNTER — Encounter (HOSPITAL_COMMUNITY): Payer: Self-pay

## 2021-03-14 ENCOUNTER — Other Ambulatory Visit: Payer: Self-pay

## 2021-03-14 ENCOUNTER — Ambulatory Visit (HOSPITAL_COMMUNITY)
Admission: RE | Admit: 2021-03-14 | Discharge: 2021-03-14 | Disposition: A | Discharge status: Home / Self Care | Payer: 59 | Source: Ambulatory Visit | Attending: Cardiology | Admitting: Cardiology

## 2021-03-14 DIAGNOSIS — R011 Cardiac murmur, unspecified: Secondary | ICD-10-CM

## 2021-03-14 DIAGNOSIS — R0609 Other forms of dyspnea: Secondary | ICD-10-CM

## 2021-03-14 DIAGNOSIS — R0600 Dyspnea, unspecified: Secondary | ICD-10-CM | POA: Insufficient documentation

## 2021-03-14 MED ORDER — METOPROLOL TARTRATE 5 MG/5ML IV SOLN
INTRAVENOUS | Status: AC
  Filled 2021-03-14: qty 5

## 2021-03-14 MED ORDER — NITROGLYCERIN 0.4 MG SL SUBL
0.8000 mg | SUBLINGUAL_TABLET | Freq: Once | SUBLINGUAL | Status: AC
  Administered 2021-03-14: 0.8 mg via SUBLINGUAL

## 2021-03-14 MED ORDER — NITROGLYCERIN 0.4 MG SL SUBL
SUBLINGUAL_TABLET | SUBLINGUAL | Status: AC
  Filled 2021-03-14: qty 2

## 2021-03-14 MED ORDER — IOHEXOL 350 MG/ML SOLN
95.0000 mL | Freq: Once | INTRAVENOUS | Status: AC | PRN
  Administered 2021-03-14: 95 mL via INTRAVENOUS

## 2021-03-14 NOTE — Progress Notes (Signed)
Patient tolerated CT well. Vital signs stable encourage to drink water throughout day.Reasons explained and verbalized understanding. Ambulated steady gait.   

## 2021-03-15 NOTE — Progress Notes (Signed)
[facility]  [address] [place],  [place]  [address] [phone]  Clinic Day:  03/21/2021  Referring physician: Julianne Handler, NP  This document serves as a record of services personally performed by Gery Pray, MD. It was created on their behalf by Curry,Lauren E, a trained medical scribe. The creation of this record is based on the scribe's personal observations and the provider's statements to them.  CHIEF COMPLAINT:  CC:  Immune thrombocytopenic purpura  Current Treatment:   Prednisone 5 mg daily  HISTORY OF PRESENT ILLNESS:  Mike [name] is a 50 y.o. male with a history of severe thrombocytopenia, felt to represent immune thrombocytopenic purpura.  He has had thrombocytopenia, which has fluctuated up and down since June 2020.  Further laboratory evaluation did not reveal any other specific etiology for his thrombocytopenia.  He had mildly elevated liver transaminases.  Abdominal ultrasound revealed increased echogenicity of hepatic parenchyma suggesting hepatic steatosis, with probable fatty sparing adjacent to gallbladder fossa.  No other abnormality was seen.  We recommended bone marrow examination, but the patient and his mother declined.  The platelet count continued to fluctuate up and down, but as his platelet count remained 25,000 in June 2021, he was started on prednisone 20 mg 3 times daily, with a good response.  He has had leukocytosis secondary to the high-dose prednisone.  He also has had oral candidiasis on occasion. Malachi Carl titers were elevated, so this may be representative of chronic fatigue syndrome. We decreased his prednisone to 45 mg in late September and platelets remained stable at 97,000.  Since we were unable to taper him off the prednisone he was treated with rituximab weekly for 4 weeks in October and November.  We have been slowly tapering the prednisone.  His platelet count has remained above 75,000 despite  decreasing the prednisone down to 30 mg daily in February, but he has been hesitant to decrease the prednisone further. 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  He has also had hypokalemia resolved with oral supplementation.  More recently, he developed lower extremity edema and blurry vision felt to be secondary to prednisone. At his visit on March 9th, his platelets were up to 98,000, so we decreased the prednisone to 25 mg daily.  He had redness, irritation and morning discharge from his eyes, so was treated for conjunctivitis with erythromycin ointment.  On April 6th, his platelets were stable at 100,000, so we decreased prednisone to 20 mg daily.  His platelets remain stable at 98,000 on April 20th, so we reduced the prednisone to 15 mg daily.  He was seen on May 11th, as he had been ill the week before with a diarrheal illness and his platelet count was 179,000, so I decreased his prednisone to 10 mg daily.  He had recurrent bilateral conjunctivitis, so I placed him back on erythromycin ophthalmic ointment daily again.  On May 18th, his platelet count was 151,000, so I decreased his prednisone to 5 mg daily. He was admitted with COVID-19 on May 24th after leaving work earlier due to altered mental status and generalized weakness.  He simply had not felt well for several days, so stayed in bed without eating or drinking, then attempted to go back to work on schedule.  He had acute kidney injury with a creatinine of 2.2, which normalized with IV hydration. He had fever, but no respiratory symptoms. He was discharged on a prolonged  prednisone taper over 15 weeks.  He saw his primary care provider after discharge from the hospital and she stopped his indapamide, and decreased his potassium chloride 20 mEq to once a day.  The patient continued potassium chloride 20 mEq, 2 tablets daily, as he was previously on 3 tablets daily.  He had been having some chest  pain and dyspnea since discontinuing indapamide, so was referred to Dr. Tomie China in Cardiology. He placed him on furosemide 40 mg daily and metoprolol 50 mg daily, as well as discontinued amlodipine.  He also had him increase his potassium to 40 mg in the morning and at noon with 20 mg in the evening.  He states he is taking the potassium 40 mg twice daily.  He states his lower extremity edema has resolved with the furosemide   INTERVAL HISTORY:  [name] is here for routine follow up and states that he has been doing fairly well.  He continues steroid taper.  He has followed with cardiology, Dr. Tomie China, and his metoprolol has been increased to 100 mg daily.  He also recommends aspirin 81 mg daily, and I concur.  He will start this today.  I have briefly reviewed his ECHO and CTA, and he will see Dr. Tomie China on August 18th to review this further and make recommendations.  The ECHO shows left ventricular hypertrophy with an EF between 55-60%, the CT of the coronary arteries looks good.  He does report some shortness of breath with exertion, such as walking from the parking lot to the waiting room.  He also reports arthralgias of the hips when he gets up, which started when he was placed on diuretic.  White count is mildly elevated, but improved, secondary to corticosteroids, platelets are normal at 131, and hemoglobin is normal.  Chemistries are unremarkable except for a calcium of 8.1.  I advised that he take oral supplement daily or TUMS.  His  appetite is good, and his weight is stable since his last visit.  He denies fever, chills or other signs of infection.  He denies nausea, vomiting, bowel issues, or abdominal pain.  He denies sore throat, cough, dyspnea, or chest pain.  REVIEW OF SYSTEMS:  Review of Systems  Constitutional:  Negative for appetite change, chills, fatigue, fever and unexpected weight change.  HENT:   Negative for lump/mass, mouth sores and sore throat.   Respiratory:  Positive for  shortness of breath (mild, but worse with exertion). Negative for cough.   Cardiovascular:  Negative for chest pain and leg swelling.  Gastrointestinal:  Negative for abdominal pain, constipation, diarrhea, nausea and vomiting.  Genitourinary:  Negative for difficulty urinating, dysuria, frequency and hematuria.   Musculoskeletal:  Positive for arthralgias (of this hips when getting up from being seated, onset with diuretic). Negative for back pain and myalgias.  Skin:  Negative for itching, rash and wound.  Neurological:  Negative for dizziness, extremity weakness, headaches, light-headedness and numbness.  Hematological:  Negative for adenopathy.  Psychiatric/Behavioral:  Negative for depression and sleep disturbance. The patient is nervous/anxious.    VITALS:  Blood pressure 127/71, pulse 84, temperature 97.9 F (36.6 C), temperature source Oral, resp. rate 18, height  (1.803 m), weight 270 lb 8 oz (122.7 kg), SpO2 99 %.  Wt Readings from Last 3 Encounters:  03/21/21 270 lb 8 oz (122.7 kg)  03/07/21 270 lb (122.5 kg)  02/27/21 275 lb (124.7 kg)    Body mass index is 37.73 kg/m.  Performance status (ECOG): 1 -  Symptomatic but completely ambulatory  PHYSICAL EXAM:  Physical Exam Constitutional:      General: He is not in acute distress.    Appearance: Normal appearance. He is normal weight.  HENT:     Head: Normocephalic and atraumatic.  Eyes:     General: No scleral icterus.    Extraocular Movements: Extraocular movements intact.     Conjunctiva/sclera: Conjunctivae normal.     Pupils: Pupils are equal, round, and reactive to light.  Cardiovascular:     Rate and Rhythm: Normal rate and regular rhythm.     Pulses: Normal pulses.     Heart sounds: Murmur heard.  Systolic murmur is present with a grade of 1/6.    No friction rub. No gallop.  Pulmonary:     Effort: Pulmonary effort is normal. No respiratory distress.     Breath sounds: Normal breath sounds.  Abdominal:      General: Bowel sounds are normal. There is no distension.     Palpations: Abdomen is soft. There is no hepatomegaly, splenomegaly or mass.     Tenderness: There is no abdominal tenderness.  Musculoskeletal:        General: Normal range of motion.     Cervical back: Normal range of motion and neck supple.     Right lower leg: No edema.     Left lower leg: No edema.  Lymphadenopathy:     Cervical: No cervical adenopathy.  Skin:    General: Skin is warm and dry.  Neurological:     General: No focal deficit present.     Mental Status: He is alert and oriented to person, place, and time. Mental status is at baseline.  Psychiatric:        Mood and Affect: Mood normal.        Behavior: Behavior normal.        Thought Content: Thought content normal.        Judgment: Judgment normal.   LABS:   CBC Latest Ref Rng & Units 03/21/2021 02/27/2021 02/13/2021  WBC - 10.9 13.5 11.2  Hemoglobin 13.5 - 17.5 13.6 13.4(A) 12.2(A)  Hematocrit 41 - 53 40(A) 40(A) 36(A)  Platelets 150 - 399 131(A) 170 122(A)   CMP Latest Ref Rng & Units 03/21/2021 02/27/2021 02/20/2021  Glucose 65 - 99 mg/dL - - 87  BUN 4 - 21 13 13 6   Creatinine 0.6 - 1.3 1.0 0.9 0.78  Sodium 137 - 147 145 142 145(H)  Potassium 3.4 - 5.3 3.5 4.0 3.4(L)  Chloride 99 - 108 104 102 101  CO2 13 - 22 25(A) 27(A) 24  Calcium 8.7 - 10.7 8.1(A) 8.0(A) 8.1(L)  Alkaline Phos 25 - 125 68 68 -  AST 14 - 40 23 19 -  ALT 10 - 40 18 18 -    STUDIES:  CT CORONARY MORPH W/CTA COR W/SCORE W/CA W/CM &/OR WO/CM  Addendum Date: 03/14/2021   ADDENDUM REPORT: 03/14/2021 13:52 CLINICAL DATA:  43M with hypertension and OSA. EXAM: Cardiac/Coronary  CT TECHNIQUE: The patient was scanned on a [employer] scanner. FINDINGS: A 120 kV prospective scan was triggered in the descending thoracic aorta at 111 HU's. Axial non-contrast 3 mm slices were carried out through the heart. The data set was analyzed on a dedicated work station and scored using the  Agatson method. Gantry rotation speed was 250 msecs and collimation was .6 mm. No beta blockade and 0.8 mg of sl NTG was given. The 3D data set was reconstructed  in 5% intervals of the 67-82 % of the R-R cycle. Diastolic phases were analyzed on a dedicated work station using MPR, MIP and VRT modes. The patient received 80 cc of contrast. Aorta: Normal size.  2.6 cm.  Minimal calcification.  No dissection. Aortic Valve:  Trileaflet.  No calcifications. Coronary Arteries:  Normal coronary origin.  Right dominance. RCA is a large dominant artery that gives rise to PDA and PLVB. There is minimal (<25%) calcified plaque proximally and mild (25-49%) calcified plaque in the mid RCA. There is minimal (<25%) calcification of the PDA. Left main is a large artery that gives rise to LAD and LCX arteries. There is minimal (<25%) calcified plaque. LAD is a large vessel that has minimal (<25%) calcified plaque in the mid LAD. LCX is a non-dominant artery that gives rise to two OM branches. There is no plaque. Coronary Calcium Score: Left main: 61.7 Left anterior descending artery: 22.8 Left circumflex artery: 7.86 Right coronary artery: 197 Total: 296 Percentile: 96th Other findings: Normal pulmonary vein drainage into the left atrium. Normal let atrial appendage without a thrombus. Normal size of the pulmonary artery. IMPRESSION: 1. Coronary calcium score of 296. This was 96th percentile for age-, race-, and sex-matched controls. 2. Normal coronary origin with right dominance. 3. Minimal plaque in LM and LAD. Mild (25-49%) RCA plaque. CAD-RADS 2. 4. Recommend aggressive risk factor modification, including LDL goal <70. Chilton Si, MD Electronically Signed   By: Chilton Si   On: 03/14/2021 13:52   Result Date: 03/14/2021 EXAM: OVER-READ INTERPRETATION  CT CHEST The following report is an over-read performed by radiologist Dr. Trudie Reed of [facility] on 03/14/2021. This over-read does not include  interpretation of cardiac or coronary anatomy or pathology. The coronary calcium score/coronary CTA interpretation by the cardiologist is attached. COMPARISON:  No priors. FINDINGS: Mild linear scarring in the left lower lobe. Atherosclerotic calcifications in the thoracic aorta. Within the visualized portions of the thorax there are no suspicious appearing pulmonary nodules or masses, there is no acute consolidative airspace disease, no pleural effusions, no pneumothorax and no lymphadenopathy. Visualized portions of the upper abdomen are unremarkable. There are no aggressive appearing lytic or blastic lesions noted in the visualized portions of the skeleton. IMPRESSION: 1.  Aortic Atherosclerosis (ICD10-I70.0). Electronically Signed: By: Trudie Reed M.D. On: 03/14/2021 09:43   ECHOCARDIOGRAM COMPLETE  Result Date: 03/12/2021    ECHOCARDIOGRAM REPORT   Patient Name:   Mike [name] Date of Exam: 03/12/2021 Medical Rec #:  [id number]        Height:       71.0 in Accession #:    [id number]       Weight:       270.0 lb Date of Birth:  1970-11-14        BSA:          2.395 m Patient Age:    50 years         BP:           114/68 mmHg Patient Gender: M                HR:           73 bpm. Exam Location:  [place] Procedure: 2D Echo Indications:    Dyspnea R06.00  History:        Patient has no prior history of Echocardiogram examinations.  Risk Factors:Hypertension.  Sonographer:    Louie Boston Referring Phys: Rito Ehrlich [facility] IMPRESSIONS  1. Left ventricular ejection fraction, by estimation, is 55 to 60%. The left ventricle has normal function. The left ventricle has no regional wall motion abnormalities. There is mild concentric left ventricular hypertrophy. Left ventricular diastolic parameters were normal.  2. Right ventricular systolic function is normal. The right ventricular size is normal. There is normal pulmonary artery systolic pressure.  3. The mitral valve is normal in structure.  No evidence of mitral valve regurgitation. No evidence of mitral stenosis.  4. The aortic valve has an indeterminant number of cusps. Aortic valve regurgitation is not visualized. No aortic stenosis is present.  5. The inferior vena cava is normal in size with greater than 50% respiratory variability, suggesting right atrial pressure of 3 mmHg. FINDINGS  Left Ventricle: Left ventricular ejection fraction, by estimation, is 55 to 60%. The left ventricle has normal function. The left ventricle has no regional wall motion abnormalities. The left ventricular internal cavity size was normal in size. There is  mild concentric left ventricular hypertrophy. Left ventricular diastolic parameters were normal. Normal left ventricular filling pressure. Right Ventricle: The right ventricular size is normal. No increase in right ventricular wall thickness. Right ventricular systolic function is normal. There is normal pulmonary artery systolic pressure. The tricuspid regurgitant velocity is 2.23 m/s, and  with an assumed right atrial pressure of 3 mmHg, the estimated right ventricular systolic pressure is 22.9 mmHg. Left Atrium: Left atrial size was normal in size. Right Atrium: Right atrial size was normal in size. Pericardium: There is no evidence of pericardial effusion. Mitral Valve: The mitral valve is normal in structure. No evidence of mitral valve regurgitation. No evidence of mitral valve stenosis. Tricuspid Valve: The tricuspid valve is normal in structure. Tricuspid valve regurgitation is trivial. No evidence of tricuspid stenosis. Aortic Valve: The aortic valve has an indeterminant number of cusps. Aortic valve regurgitation is not visualized. No aortic stenosis is present. Pulmonic Valve: The pulmonic valve was normal in structure. Pulmonic valve regurgitation is not visualized. No evidence of pulmonic stenosis. Aorta: The aortic root and ascending aorta are structurally normal, with no evidence of dilitation and the  aortic arch was not well visualized. Venous: A normal flow pattern is recorded from the right upper pulmonary vein. The inferior vena cava is normal in size with greater than 50% respiratory variability, suggesting right atrial pressure of 3 mmHg. IAS/Shunts: No atrial level shunt detected by color flow Doppler.  LEFT VENTRICLE PLAX 2D LVIDd:         5.70 cm  Diastology LVIDs:         3.70 cm  LV e' medial:    7.18 cm/s LV PW:         1.10 cm  LV E/e' medial:  16.7 LV IVS:        1.10 cm  LV e' lateral:   7.72 cm/s LVOT diam:     2.00 cm  LV E/e' lateral: 15.5 LV SV:         57 LV SV Index:   24 LVOT Area:     3.14 cm  RIGHT VENTRICLE             IVC RV S prime:     17.20 cm/s  IVC diam: 1.60 cm TAPSE (M-mode): 2.6 cm LEFT ATRIUM             Index  RIGHT ATRIUM           Index LA diam:        4.20 cm 1.75 cm/m  RA Area:     15.90 cm LA Vol (A2C):   46.8 ml 19.54 ml/m RA Volume:   43.10 ml  17.99 ml/m LA Vol (A4C):   56.0 ml 23.38 ml/m LA Biplane Vol: 52.7 ml 22.00 ml/m  AORTIC VALVE LVOT Vmax:   91.70 cm/s LVOT Vmean:  59.900 cm/s LVOT VTI:    0.180 m  AORTA Ao Root diam: 2.80 cm Ao Asc diam:  2.80 cm Ao Desc diam: 1.80 cm MITRAL VALVE                TRICUSPID VALVE MV Area (PHT): 3.65 cm     TR Peak grad:   19.9 mmHg MV Decel Time: 208 msec     TR Vmax:        223.00 cm/s MV E velocity: 120.00 cm/s MV A velocity: 92.00 cm/s   SHUNTS MV E/A ratio:  1.30         Systemic VTI:  0.18 m                             Systemic Diam: 2.00 cm Norman Herrlich MD Electronically signed by Norman Herrlich MD Signature Date/Time: 03/12/2021/4:31:55 PM    Final       HISTORY:   Allergies: No Known Allergies  Current Medications: Current Outpatient Medications  Medication Sig Dispense Refill   albuterol (VENTOLIN HFA) 108 (90 Base) MCG/ACT inhaler Inhale 2 puffs into the lungs every 4 (four) hours as needed for wheezing or shortness of breath.     amLODipine (NORVASC) 10 MG tablet Take 10 mg by mouth daily.      Ascorbic Acid (VITAMIN C) 1000 MG tablet Take 1,000 mg by mouth daily.     benazepril (LOTENSIN) 20 MG tablet Take 1 tablet (20 mg total) by mouth daily. 90 tablet 3   cholecalciferol (VITAMIN D3) 25 MCG (1000 UNIT) tablet Take 1,000 Units by mouth daily.     escitalopram (LEXAPRO) 10 MG tablet Take 10 mg by mouth daily.     furosemide (LASIX) 40 MG tablet Take 1 tablet (40 mg total) by mouth daily. 90 tablet 3   glipiZIDE (GLUCOTROL XL) 10 MG 24 hr tablet Take 10 mg by mouth daily.     metFORMIN (GLUCOPHAGE-XR) 500 MG 24 hr tablet Take 1,000 mg by mouth 2 (two) times daily.     metoprolol succinate (TOPROL-XL) 100 MG 24 hr tablet Take 1 tablet (100 mg total) by mouth daily. Take with or immediately following a meal. 90 tablet 3   omeprazole (PRILOSEC) 20 MG capsule Take 20 mg by mouth 4 (four) times daily.     potassium chloride SA (KLOR-CON) 20 MEQ tablet Take 2 tablets (40 mEq total) by mouth 2 (two) times daily. 120 tablet 2   predniSONE (DELTASONE) 5 MG tablet Take 1 tablet (5 mg total) by mouth 2 (two) times daily with a meal. 100 tablet 1   sildenafil (VIAGRA) 100 MG tablet Take 100 mg by mouth daily as needed for erectile dysfunction.     vitamin B-12 (CYANOCOBALAMIN) 1000 MCG tablet Take 1,000 mcg by mouth daily.     vitamin k 100 MCG tablet Take 100 mcg by mouth daily.     No current facility-administered medications for this visit.     ASSESSMENT &  PLAN:   Assessment:  1. Significant thrombocytopenia, which has been fluctuating up and down since June 2020.  This is immune thrombocytopenic purpura.  We placed him on prednisone 20 mg three times daily in early June.  We were unable to taper his steroids, so he underwent rituximab therapy weekly for 4 weeks in October 2021 with a partial response.  He is not a good candidate for splenectomy unless absolutely necessary.  His platelet count has remained above 75,000, we have steadily tapered his prednisone and prior to his recent hospital  admission, he was on prednisone 5mg  daily. He is currently on high-dose prednisone taper since his hospitalization for COVID. We will simply monitor him and hope to eventually taper him off completely.       2. Leukocytosis, secondary to high dose steroids, improved.     3. Hypertension, secondary to chronic prednisone , which is well controlled with increasing metoprolol to 100 mg. In fact his sister is concerned about occasional extremely low readings.     4.  Mild hypokalemia, he continues potassium chloride 40 mEq 2 times daily.  It is barely normal today.   5. Steroid induced diabetes, for which he continues metformin 500 mg twice daily and glipizide 5 mg daily.     6.  Elevated Epstein Barr titers.  This may be indicative of chronic fatigue syndrome, and I advised that he rest as needed.  We had a discussion about this diagnosis.  His sister has him taking zinc, lemon balm, and L lysine.   7.  Sleep apnea, untreated. I have encouraged him to use his CPAP mask for better quality of life and to reduce his daytime sleepiness.    8.  Lower extremity edema, felt to be due to chronic steroid use, basically resolved with furosemide.   9.  Chronic anxiety, mainly related to his health.  10.  Recent admission with acute kidney injury due to dehydration associated with COVID-19.   He is on a 15 week prednisone taper.  I advised him that when his prednisone dose gets to 5 mg daily, he continue that until his next appointment with .  11.   New onset chest pain, he is following with Dr. [place] and will see him again on August 18th.  Evaluation has been unrevealing.  His symptoms have improved with furosemide and metoprolol.    12.  Shortness of breath with exertion which persists despite a good cardiac evaluation.  I recommend a pulmonary consultation for further evaluation of PFTs and sleep study.  13.  Hypocalcemia, mild.  He will start oral supplement daily.   Plan:     He will continue  the prednisone taper as prescribed by the hospitalist.  As he has persistent shortness of breath with mild exertion, I will refer him to a pulmonologist for further evaluation.  He will continue to follow with Dr. August 20.  He has recommended aspirin 81 mg daily and I concur, and so he will start this today and as long as his platelet count is over 100,000.  Since his calcium is low, he will start oral supplement daily.  He will return in 2 weeks for CBC.  I will plan to see him back in 4 weeks with a CBC and comprehensive metabolic panel. The patient and his sister understand the plans discussed today and are in agreement with them. They know to contact our office if he develops concerns prior to his next appointment.  I provided 35 minutes  of face-to-face time during this this encounter and > 50% was spent counseling as documented under my assessment and plan.   I, Foye Deer, am acting as scribe for Dellia Beckwith, MD  I have reviewed this report as typed by the medical scribe, and it is complete and accurate.

## 2021-03-20 ENCOUNTER — Telehealth: Payer: Self-pay | Admitting: Cardiology

## 2021-03-20 DIAGNOSIS — R931 Abnormal findings on diagnostic imaging of heart and coronary circulation: Secondary | ICD-10-CM

## 2021-03-20 NOTE — Telephone Encounter (Signed)
Pt is returning a call  

## 2021-03-20 NOTE — Telephone Encounter (Signed)
Results reviewed with pt as per Dr. Revankar's note.  Pt verbalized understanding and had no additional questions.   

## 2021-03-21 ENCOUNTER — Encounter: Payer: Self-pay | Admitting: Oncology

## 2021-03-21 ENCOUNTER — Inpatient Hospital Stay (INDEPENDENT_AMBULATORY_CARE_PROVIDER_SITE_OTHER): Payer: 59 | Admitting: Oncology

## 2021-03-21 ENCOUNTER — Other Ambulatory Visit: Payer: Self-pay

## 2021-03-21 ENCOUNTER — Inpatient Hospital Stay: Payer: 59 | Attending: Oncology

## 2021-03-21 VITALS — BP 127/71 | HR 84 | Temp 97.9°F | Resp 18 | Ht 71.0 in | Wt 270.5 lb

## 2021-03-21 DIAGNOSIS — D693 Immune thrombocytopenic purpura: Secondary | ICD-10-CM

## 2021-03-21 LAB — CBC AND DIFFERENTIAL
HCT: 40 — AB (ref 41–53)
Hemoglobin: 13.6 (ref 13.5–17.5)
Neutrophils Absolute: 7.09
Platelets: 131 — AB (ref 150–399)
WBC: 10.9

## 2021-03-21 LAB — BASIC METABOLIC PANEL
BUN: 13 (ref 4–21)
CO2: 25 — AB (ref 13–22)
Chloride: 104 (ref 99–108)
Creatinine: 1 (ref 0.6–1.3)
Glucose: 93
Potassium: 3.5 (ref 3.4–5.3)
Sodium: 145 (ref 137–147)

## 2021-03-21 LAB — COMPREHENSIVE METABOLIC PANEL
Albumin: 4.6 (ref 3.5–5.0)
Calcium: 8.1 — AB (ref 8.7–10.7)

## 2021-03-21 LAB — HEPATIC FUNCTION PANEL
ALT: 18 (ref 10–40)
AST: 23 (ref 14–40)
Alkaline Phosphatase: 68 (ref 25–125)
Bilirubin, Total: 0.8

## 2021-03-21 LAB — CBC: RBC: 4.87 (ref 3.87–5.11)

## 2021-03-21 NOTE — Telephone Encounter (Signed)
Faxed referral Dr. Terrilee Croak office.

## 2021-03-21 NOTE — Telephone Encounter (Signed)
-----   Message from Dellia Beckwith, MD sent at 03/21/2021 11:31 AM EDT ----- Regarding: referral Please ref Dr. Blenda Nicely for eval. of dyspnea and probable sleep apnea, needs PFT's, etc.

## 2021-03-22 ENCOUNTER — Encounter: Payer: Self-pay | Admitting: Oncology

## 2021-03-22 NOTE — Progress Notes (Signed)
Spoke with patient yesterday 07/21, he was asking about any financial assistance that may be available. He has a pretty high bill through [facility], provided him with hardship paperwork, I will submit that once completed. Also helped enroll him into the [employer].

## 2021-03-25 ENCOUNTER — Encounter: Payer: Self-pay | Admitting: Oncology

## 2021-04-03 ENCOUNTER — Inpatient Hospital Stay: Payer: 59 | Attending: Oncology

## 2021-04-03 ENCOUNTER — Other Ambulatory Visit: Payer: Self-pay | Admitting: Hematology and Oncology

## 2021-04-03 DIAGNOSIS — E099 Drug or chemical induced diabetes mellitus without complications: Secondary | ICD-10-CM | POA: Insufficient documentation

## 2021-04-03 DIAGNOSIS — Z79899 Other long term (current) drug therapy: Secondary | ICD-10-CM | POA: Insufficient documentation

## 2021-04-03 DIAGNOSIS — D693 Immune thrombocytopenic purpura: Secondary | ICD-10-CM

## 2021-04-03 DIAGNOSIS — F419 Anxiety disorder, unspecified: Secondary | ICD-10-CM | POA: Insufficient documentation

## 2021-04-03 DIAGNOSIS — M545 Low back pain, unspecified: Secondary | ICD-10-CM | POA: Insufficient documentation

## 2021-04-03 DIAGNOSIS — Z7984 Long term (current) use of oral hypoglycemic drugs: Secondary | ICD-10-CM | POA: Insufficient documentation

## 2021-04-03 DIAGNOSIS — G4730 Sleep apnea, unspecified: Secondary | ICD-10-CM | POA: Insufficient documentation

## 2021-04-03 DIAGNOSIS — T380X5D Adverse effect of glucocorticoids and synthetic analogues, subsequent encounter: Secondary | ICD-10-CM | POA: Insufficient documentation

## 2021-04-03 DIAGNOSIS — M25552 Pain in left hip: Secondary | ICD-10-CM | POA: Insufficient documentation

## 2021-04-04 ENCOUNTER — Inpatient Hospital Stay: Payer: 59

## 2021-04-05 ENCOUNTER — Encounter: Payer: Self-pay | Admitting: Oncology

## 2021-04-11 NOTE — Progress Notes (Signed)
[facility]  [address] [place],  [place]  [address] [phone]  Clinic Day:  04/18/2021  Referring physician: Julianne Handler, NP  This document serves as a record of services personally performed by Gery Pray, MD. It was created on their behalf by Curry,Lauren E, a trained medical scribe. The creation of this record is based on the scribe's personal observations and the provider's statements to them.  CHIEF COMPLAINT:  CC:  Immune thrombocytopenic purpura  Current Treatment:   Prednisone 5 mg daily  HISTORY OF PRESENT ILLNESS:  Mike [name] is a 50 y.o. male with a history of severe thrombocytopenia, felt to represent immune thrombocytopenic purpura.  He has had thrombocytopenia, which has fluctuated up and down since June 2020.  Further laboratory evaluation did not reveal any other specific etiology for his thrombocytopenia.  He had mildly elevated liver transaminases.  Abdominal ultrasound revealed increased echogenicity of hepatic parenchyma suggesting hepatic steatosis, with probable fatty sparing adjacent to gallbladder fossa.  No other abnormality was seen.  We recommended bone marrow examination, but the patient and his mother declined.  The platelet count continued to fluctuate up and down, but as his platelet count remained 25,000 in June 2021, he was started on prednisone 20 mg 3 times daily, with a good response.  He has had leukocytosis secondary to the high-dose prednisone.  He also has had oral candidiasis on occasion. Malachi Carl titers were elevated, so this may be representative of chronic fatigue syndrome. We decreased his prednisone to 45 mg in late September and platelets remained stable at 97,000.  Since we were unable to taper him off the prednisone he was treated with rituximab weekly for 4 weeks in October and November.  We have been slowly tapering the prednisone.  His platelet count has remained above 75,000 despite  decreasing the prednisone down to 30 mg daily in February, but he has been hesitant to decrease the prednisone further. 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  He has also had hypokalemia resolved with oral supplementation.  More recently, he developed lower extremity edema and blurry vision felt to be secondary to prednisone. At his visit on March 9th, his platelets were up to 98,000, so we decreased the prednisone to 25 mg daily.  He had redness, irritation and morning discharge from his eyes, so was treated for conjunctivitis with erythromycin ointment.  On April 6th, his platelets were stable at 100,000, so we decreased prednisone to 20 mg daily.  His platelets remain stable at 98,000 on April 20th, so we reduced the prednisone to 15 mg daily.  He was seen on May 11th, as he had been ill the week before with a diarrheal illness and his platelet count was 179,000, so I decreased his prednisone to 10 mg daily.  He had recurrent bilateral conjunctivitis, so I placed him back on erythromycin ophthalmic ointment daily again.  On May 18th, his platelet count was 151,000, so I decreased his prednisone to 5 mg daily. He was admitted with COVID-19 on May 24th after leaving work earlier due to altered mental status and generalized weakness.  He simply had not felt well for several days, so stayed in bed without eating or drinking, then attempted to go back to work on schedule.  He had acute kidney injury with a creatinine of 2.2, which normalized with IV hydration. He had fever, but no respiratory symptoms. He was discharged on a prolonged  prednisone taper over 15 weeks.  He saw his primary care provider after discharge from the hospital and she stopped his indapamide, and decreased his potassium chloride 20 mEq to once a day.  The patient continued potassium chloride 20 mEq, 2 tablets daily, as he was previously on 3 tablets daily.  He had been having some chest  pain and dyspnea since discontinuing indapamide, so was referred to Dr. Tomie China in Cardiology. He placed him on furosemide 40 mg daily and metoprolol 50 mg daily, as well as discontinued amlodipine.  He also had him increase his potassium to 40 mg in the morning and at noon with 20 mg in the evening.  He states he is taking the potassium 40 mg twice daily.  He states his lower extremity edema has resolved with the furosemide.  The ECHO shows left ventricular hypertrophy with an EF between 55-60%, the CT of the coronary arteries looks good.    INTERVAL HISTORY:  [name] is here for routine follow up and notes left hip and lower back pain.  He is currently on gabapentin 300 mg BID.  He is scheduled to see Dr. Wells Guiles PA on August 30th for further evaluation [facility].  He continues steroid taper, and is only on prednisone 5 mg three times this week.  His platelet count has decreased from 140,000 to 73,000.  I advised that he increase his prednisone dose back to 5 mg daily  and discontinue gabapentin for now.  Chemistries are unremarkable.  His  appetite is good, and he has lost 17 pounds since his last visit in July.  He denies fever, chills or other signs of infection.  He denies nausea, vomiting, bowel issues, or abdominal pain.  He denies sore throat, cough, dyspnea, or chest pain.  REVIEW OF SYSTEMS:  Review of Systems  Constitutional: Negative.  Negative for appetite change, chills, fatigue, fever and unexpected weight change.  HENT:  Negative.    Eyes: Negative.   Respiratory:  Positive for shortness of breath. Negative for chest tightness, cough, hemoptysis and wheezing.   Cardiovascular: Negative.  Negative for chest pain, leg swelling and palpitations.  Gastrointestinal: Negative.  Negative for abdominal distention, abdominal pain, blood in stool, constipation, diarrhea, nausea and vomiting.  Endocrine: Negative.   Genitourinary: Negative.  Negative for difficulty  urinating, dysuria, frequency and hematuria.   Musculoskeletal:  Positive for back pain (lower). Negative for arthralgias, flank pain, gait problem and myalgias.  Skin: Negative.   Neurological: Negative.  Negative for dizziness, extremity weakness, gait problem, headaches, light-headedness, numbness, seizures and speech difficulty.  Hematological: Negative.   Psychiatric/Behavioral:  Negative for depression and sleep disturbance. The patient is nervous/anxious.   All other systems reviewed and are negative.  VITALS:  Blood pressure (!) 146/80, pulse 86, temperature 98.4 F (36.9 C), temperature source Oral, resp. rate 18, height  (1.803 m), weight 253 lb 9.6 oz (115 kg), SpO2 98 %.  Wt Readings from Last 3 Encounters:  04/18/21 253 lb 9.6 oz (115 kg)  04/18/21 256 lb (116.1 kg)  03/21/21 270 lb 8 oz (122.7 kg)    Body mass index is 35.37 kg/m.  Performance status (ECOG): 1 - Symptomatic but completely ambulatory  PHYSICAL EXAM:  Physical Exam Constitutional:      General: He is not in acute distress.    Appearance: Normal appearance. He is normal weight.  HENT:     Head: Normocephalic and atraumatic.  Eyes:     General:  No scleral icterus.    Extraocular Movements: Extraocular movements intact.     Conjunctiva/sclera: Conjunctivae normal.     Pupils: Pupils are equal, round, and reactive to light.  Cardiovascular:     Rate and Rhythm: Normal rate and regular rhythm.     Pulses: Normal pulses.     Heart sounds: Normal heart sounds. No murmur heard.   No friction rub. No gallop.  Pulmonary:     Effort: Pulmonary effort is normal. No respiratory distress.     Breath sounds: Normal breath sounds.  Abdominal:     General: Bowel sounds are normal. There is no distension.     Palpations: Abdomen is soft. There is splenomegaly (just below the left costal margin). There is no hepatomegaly or mass.     Tenderness: There is no abdominal tenderness.  Musculoskeletal:         General: Normal range of motion.     Cervical back: Normal range of motion and neck supple.     Right lower leg: No edema.     Left lower leg: No edema.  Lymphadenopathy:     Cervical: No cervical adenopathy.  Skin:    General: Skin is warm and dry.  Neurological:     General: No focal deficit present.     Mental Status: He is alert and oriented to person, place, and time. Mental status is at baseline.  Psychiatric:        Mood and Affect: Mood normal.        Behavior: Behavior normal.        Thought Content: Thought content normal.        Judgment: Judgment normal.   LABS:   CBC Latest Ref Rng & Units 03/21/2021 02/27/2021 02/13/2021  WBC - 10.9 13.5 11.2  Hemoglobin 13.5 - 17.5 13.6 13.4(A) 12.2(A)  Hematocrit 41 - 53 40(A) 40(A) 36(A)  Platelets 150 - 399 131(A) 170 122(A)   CMP Latest Ref Rng & Units 04/18/2021 03/21/2021 02/27/2021  Glucose 65 - 99 mg/dL - - -  BUN 4 - 21 - 13 13  Creatinine 0.6 - 1.3 - 1.0 0.9  Sodium 137 - 147 - 145 142  Potassium 3.4 - 5.3 - 3.5 4.0  Chloride 99 - 108 - 104 102  CO2 13 - 22 - 25(A) 27(A)  Calcium 8.7 - 10.7 - 8.1(A) 8.0(A)  Total Protein 6.5 - 8.1 g/dL 7.9 - -  Total Bilirubin 0.3 - 1.2 mg/dL 1.2 - -  Alkaline Phos 38 - 126 U/L 61 68 68  AST 15 - 41 U/L 16 23 19   ALT 0 - 44 U/L 18 18 18     STUDIES:  No results found.    HISTORY:   Allergies: No Known Allergies  Current Medications: Current Outpatient Medications  Medication Sig Dispense Refill   acetaminophen (TYLENOL) 500 MG tablet Take 500 mg by mouth every 4 (four) hours as needed for moderate pain.     albuterol (VENTOLIN HFA) 108 (90 Base) MCG/ACT inhaler Inhale 2 puffs into the lungs every 4 (four) hours as needed for wheezing or shortness of breath.     amLODipine (NORVASC) 10 MG tablet Take 10 mg by mouth daily.     Ascorbic Acid (VITAMIN C) 1000 MG tablet Take 1,000 mg by mouth daily.     benazepril (LOTENSIN) 20 MG tablet Take 1 tablet (20 mg total) by mouth daily.  90 tablet 3   calcium carbonate (TUMS - DOSED IN MG ELEMENTAL CALCIUM)  500 MG chewable tablet Chew 2 tablets by mouth 2 (two) times daily.     cholecalciferol (VITAMIN D3) 25 MCG (1000 UNIT) tablet Take 1,000 Units by mouth daily.     escitalopram (LEXAPRO) 10 MG tablet Take 10 mg by mouth daily.     furosemide (LASIX) 40 MG tablet Take 1 tablet (40 mg total) by mouth daily. 90 tablet 3   gabapentin (NEURONTIN) 300 MG capsule Take 300 mg by mouth 3 (three) times daily.     glipiZIDE (GLUCOTROL XL) 10 MG 24 hr tablet Take 10 mg by mouth daily.     metFORMIN (GLUCOPHAGE-XR) 500 MG 24 hr tablet Take 1,000 mg by mouth 2 (two) times daily.     metoprolol succinate (TOPROL-XL) 100 MG 24 hr tablet Take 1 tablet (100 mg total) by mouth daily. Take with or immediately following a meal. 90 tablet 3   omeprazole (PRILOSEC) 20 MG capsule Take 20 mg by mouth daily.     oxyCODONE (OXY IR/ROXICODONE) 5 MG immediate release tablet Take 5 mg by mouth every 6 (six) hours as needed for pain.     potassium chloride SA (KLOR-CON) 20 MEQ tablet Take 2 tablets (40 mEq total) by mouth 2 (two) times daily. 120 tablet 2   predniSONE (DELTASONE) 5 MG tablet Take 1 tablet (5 mg total) by mouth 2 (two) times daily with a meal. 100 tablet 1   sildenafil (VIAGRA) 100 MG tablet Take 100 mg by mouth daily as needed for erectile dysfunction.     vitamin B-12 (CYANOCOBALAMIN) 1000 MCG tablet Take 1,000 mcg by mouth daily.     vitamin k 100 MCG tablet Take 100 mcg by mouth daily.     No current facility-administered medications for this visit.     ASSESSMENT & PLAN:   Assessment:  1. Significant thrombocytopenia, which has been fluctuating up and down since June 2020.  This is immune thrombocytopenic purpura.  We placed him on prednisone 20 mg three times daily in early June.  We were unable to taper his steroids, so he underwent rituximab therapy weekly for 4 weeks in October 2021 with a partial response.  He is not a good  candidate for splenectomy unless absolutely necessary.  His platelet count has remained above 75,000, we have steadily tapered his prednisone and prior to his recent hospital admission, he was on prednisone 5mg  daily. He was admitted for COVID and placed back on high dose prednisone, with an extremely slow taper.  As his platelet count has worsened, I advised that he increase the dose back to 5 mg daily, and hope to eventually taper him off completely.       2. Steroid induced diabetes, for which he continues metformin 500 mg twice daily and glipizide 5 mg daily.     3.  Elevated Epstein Barr titers.  This may be indicative of chronic fatigue syndrome, and I advised that he rest as needed.  We had a discussion about this diagnosis.  His sister has him taking zinc, lemon balm, and L lysine.   4.  Sleep apnea, untreated. I have encouraged him to use his CPAP mask for better quality of life and to reduce his daytime sleepiness.  He has been referred to  Pulmonary.   5.  Chronic anxiety, mainly related to his health.  6.  Recent admission with acute kidney injury due to dehydration associated with COVID-19.   He was placed on an extremely slow prednisone taper.    7.  Shortness of breath with exertion which persists despite a good cardiac evaluation.  He is following with pulmonology.  8.  Significant left hip and lower back pain.  He is scheduled with Dr. Wells Guiles office, [facility], on August 30th.     Plan:     He will increase his prednisone dose to 5 mg daily.  He will continue to follow with Dr. Tomie China and pulmonology.  He is scheduled with Dr. Wells Guiles office on August 30th in regards to his left hip and lower back pain.  He will discontinue gabapentin 300 mg BID for now.  He will return in 1 week for CBC.  I will plan to see him back in 2 weeks with a CBC and comprehensive metabolic panel. The patient and his sister understand the plans discussed today and are in agreement  with them. They know to contact our office if he develops concerns prior to his next appointment.  I provided 35 minutes of face-to-face time during this this encounter and > 50% was spent counseling as documented under my assessment and plan.   I, Foye Deer, am acting as scribe for Dellia Beckwith, MD  I have reviewed this report as typed by the medical scribe, and it is complete and accurate.  Gery Pray, MD [facility]

## 2021-04-18 ENCOUNTER — Other Ambulatory Visit: Payer: Self-pay

## 2021-04-18 ENCOUNTER — Ambulatory Visit (INDEPENDENT_AMBULATORY_CARE_PROVIDER_SITE_OTHER): Payer: 59 | Admitting: Cardiology

## 2021-04-18 ENCOUNTER — Other Ambulatory Visit: Payer: Self-pay | Admitting: Oncology

## 2021-04-18 ENCOUNTER — Inpatient Hospital Stay (INDEPENDENT_AMBULATORY_CARE_PROVIDER_SITE_OTHER): Payer: 59 | Admitting: Oncology

## 2021-04-18 ENCOUNTER — Encounter: Payer: Self-pay | Admitting: Oncology

## 2021-04-18 ENCOUNTER — Encounter: Payer: Self-pay | Admitting: Cardiology

## 2021-04-18 ENCOUNTER — Other Ambulatory Visit: Payer: Self-pay | Admitting: Hematology and Oncology

## 2021-04-18 ENCOUNTER — Inpatient Hospital Stay: Payer: 59

## 2021-04-18 ENCOUNTER — Telehealth: Payer: Self-pay | Admitting: Oncology

## 2021-04-18 ENCOUNTER — Telehealth: Payer: Self-pay

## 2021-04-18 VITALS — BP 114/80 | HR 76 | Ht 71.0 in | Wt 256.0 lb

## 2021-04-18 VITALS — BP 146/80 | HR 86 | Temp 98.4°F | Resp 18 | Ht 71.0 in | Wt 253.6 lb

## 2021-04-18 DIAGNOSIS — E66811 Obesity, class 1: Secondary | ICD-10-CM

## 2021-04-18 DIAGNOSIS — D693 Immune thrombocytopenic purpura: Secondary | ICD-10-CM

## 2021-04-18 DIAGNOSIS — M25552 Pain in left hip: Secondary | ICD-10-CM | POA: Diagnosis not present

## 2021-04-18 DIAGNOSIS — Z7984 Long term (current) use of oral hypoglycemic drugs: Secondary | ICD-10-CM | POA: Diagnosis not present

## 2021-04-18 DIAGNOSIS — T380X5D Adverse effect of glucocorticoids and synthetic analogues, subsequent encounter: Secondary | ICD-10-CM | POA: Diagnosis not present

## 2021-04-18 DIAGNOSIS — E099 Drug or chemical induced diabetes mellitus without complications: Secondary | ICD-10-CM | POA: Diagnosis not present

## 2021-04-18 DIAGNOSIS — E669 Obesity, unspecified: Secondary | ICD-10-CM | POA: Insufficient documentation

## 2021-04-18 DIAGNOSIS — G4730 Sleep apnea, unspecified: Secondary | ICD-10-CM | POA: Diagnosis not present

## 2021-04-18 DIAGNOSIS — I10 Essential (primary) hypertension: Secondary | ICD-10-CM | POA: Diagnosis not present

## 2021-04-18 DIAGNOSIS — F419 Anxiety disorder, unspecified: Secondary | ICD-10-CM | POA: Diagnosis not present

## 2021-04-18 DIAGNOSIS — M545 Low back pain, unspecified: Secondary | ICD-10-CM | POA: Diagnosis not present

## 2021-04-18 DIAGNOSIS — Z79899 Other long term (current) drug therapy: Secondary | ICD-10-CM | POA: Diagnosis not present

## 2021-04-18 HISTORY — DX: Obesity, class 1: E66.811

## 2021-04-18 HISTORY — DX: Obesity, unspecified: E66.9

## 2021-04-18 LAB — HEPATIC FUNCTION PANEL
ALT: 18 U/L (ref 0–44)
ALT: 21 (ref 10–40)
AST: 16 U/L (ref 15–41)
AST: 23 (ref 14–40)
Albumin: 4.9 g/dL (ref 3.5–5.0)
Alkaline Phosphatase: 61 U/L (ref 38–126)
Alkaline Phosphatase: 63 (ref 25–125)
Bilirubin, Direct: 0.2 mg/dL (ref 0.0–0.2)
Bilirubin, Total: 1
Indirect Bilirubin: 1 mg/dL — ABNORMAL HIGH (ref 0.3–0.9)
Total Bilirubin: 1.2 mg/dL (ref 0.3–1.2)
Total Protein: 7.9 g/dL (ref 6.5–8.1)

## 2021-04-18 LAB — CBC AND DIFFERENTIAL
HCT: 42 (ref 41–53)
Hemoglobin: 14.1 (ref 13.5–17.5)
Neutrophils Absolute: 5.92
Platelets: 73 — AB (ref 150–399)
WBC: 9.7

## 2021-04-18 LAB — LIPID PANEL
Cholesterol: 176 mg/dL (ref 0–200)
HDL: 31 mg/dL — ABNORMAL LOW (ref >40)
LDL Cholesterol: 118 mg/dL — ABNORMAL HIGH (ref 0–99)
Total CHOL/HDL Ratio: 5.7 RATIO
Triglycerides: 137 mg/dL (ref <150)
VLDL: 27 mg/dL (ref 0–40)

## 2021-04-18 LAB — BASIC METABOLIC PANEL
BUN: 12 (ref 4–21)
CO2: 22 (ref 13–22)
Chloride: 103 (ref 99–108)
Creatinine: 0.9 (ref 0.6–1.3)
Glucose: 109
Potassium: 3.9 (ref 3.4–5.3)
Sodium: 141 (ref 137–147)

## 2021-04-18 LAB — CBC: RBC: 5.02 (ref 3.87–5.11)

## 2021-04-18 LAB — COMPREHENSIVE METABOLIC PANEL
Albumin: 4.8 (ref 3.5–5.0)
Calcium: 9.1 (ref 8.7–10.7)

## 2021-04-18 LAB — HEMOGLOBIN A1C
Hgb A1c MFr Bld: 5.3 % (ref 4.8–5.6)
Mean Plasma Glucose: 105.41 mg/dL

## 2021-04-18 NOTE — Telephone Encounter (Signed)
Pt arrived to clinic asking if we could go ahead & draw his labs this morning (has appt for this evening)? He just left the cardiology office and Dr Tomie China would like liver panel, lipid panel, Hgb A1c added to our labs. Melissa, NP, agreed to ad orders. Shay notified.

## 2021-04-18 NOTE — Progress Notes (Signed)
Cardiology Office Note:    Date:  04/18/2021   ID:  Mike [name], DOB [birth year], MRN [id number]  PCP:  Julianne Handler, NP  Cardiologist:  Garwin Brothers, MD   Referring MD: Julianne Handler, NP    ASSESSMENT:    1. Primary hypertension   2. Obesity (BMI 35.0-39.9 without comorbidity)   3. Immune thrombocytopenic purpura (HCC)   4. Sleep apnea, unspecified type    PLAN:    In order of problems listed above:  Coronary artery disease: Nonobstructive in nature: Details mentioned above.  Echocardiogram also has been unremarkable.  Patient had questions about getting back to work.  From a cardiovascular standpoint I do not see much of an issue.  However the patient has shortness of breath on exertion and he might need more appropriate fine-tuning of his medications were second opinion with pulmonology specialist.  He is agreeable.  He will arrange for this with his primary care provider. Essential hypertension: Blood pressure stable and diet was emphasized.  Lifestyle modification urged.  Pedal edema is also better. Mixed dyslipidemia: On statin therapy and lipids were reviewed and I requested him to get blood work done from his oncologist.  He will also get Chem-7 TSH and liver lipid check for follow-up. Obesity: Weight reduction stressed.  Patient has done very well with diet and exercise and has lost significant amount of weight and I congratulated him about it.  His sister is very supportive. Patient will be seen in follow-up appointment in 1 month or earlier if the patient has any concerns    Medication Adjustments/Labs and Tests Ordered: Current medicines are reviewed at length with the patient today.  Concerns regarding medicines are outlined above.  No orders of the defined types were placed in this encounter.  No orders of the defined types were placed in this encounter.    Chief Complaint  Patient presents with   Follow-up     History of Present Illness:     Mike [name] is a 50 y.o. male.  Patient has past medical history of nonobstructive coronary artery disease, essential hypertension dyslipidemia and obesity.  He has history of ITP.  He denies any problems at this time and takes care of activities of daily living.  No chest pain orthopnea or PND.  Post COVID he still has shortness of breath on exertion.  At the time of my evaluation, the patient is alert awake oriented and in no distress.  Past Medical History:  Diagnosis Date   Acute kidney injury (HCC)    Allergic rhinitis with postnasal drip    Allergy    Anemia    Dyspnea on exertion    GERD (gastroesophageal reflux disease)    GERD without esophagitis    Hypertension    Hyponatremia    Immune thrombocytopenic purpura (HCC)    Immune thrombocytopenic purpura (HCC)    Metabolic syndrome    Morbid obesity (HCC)    Obstructive sleep apnea hypopnea, moderate    Post-COVID syndrome    Shortness of breath    Sleep apnea    wears a mouth piece   Steroid-induced diabetes (HCC)    Stress and adjustment reaction    Thrombocytopenia, unspecified (HCC)     Past Surgical History:  Procedure Laterality Date   NO PAST SURGERIES      Current Medications: Current Meds  Medication Sig   acetaminophen (TYLENOL) 500 MG tablet Take 500 mg by mouth every 4 (four) hours as needed  for moderate pain.   albuterol (VENTOLIN HFA) 108 (90 Base) MCG/ACT inhaler Inhale 2 puffs into the lungs every 4 (four) hours as needed for wheezing or shortness of breath.   amLODipine (NORVASC) 10 MG tablet Take 10 mg by mouth daily.   Ascorbic Acid (VITAMIN C) 1000 MG tablet Take 1,000 mg by mouth daily.   benazepril (LOTENSIN) 20 MG tablet Take 1 tablet (20 mg total) by mouth daily.   calcium carbonate (TUMS - DOSED IN MG ELEMENTAL CALCIUM) 500 MG chewable tablet Chew 2 tablets by mouth 2 (two) times daily.   cholecalciferol (VITAMIN D3) 25 MCG (1000 UNIT) tablet Take 1,000 Units by mouth daily.    escitalopram (LEXAPRO) 10 MG tablet Take 10 mg by mouth daily.   furosemide (LASIX) 40 MG tablet Take 1 tablet (40 mg total) by mouth daily.   gabapentin (NEURONTIN) 300 MG capsule Take 300 mg by mouth 3 (three) times daily.   glipiZIDE (GLUCOTROL XL) 10 MG 24 hr tablet Take 10 mg by mouth daily.   metFORMIN (GLUCOPHAGE-XR) 500 MG 24 hr tablet Take 1,000 mg by mouth 2 (two) times daily.   metoprolol succinate (TOPROL-XL) 100 MG 24 hr tablet Take 1 tablet (100 mg total) by mouth daily. Take with or immediately following a meal.   omeprazole (PRILOSEC) 20 MG capsule Take 20 mg by mouth daily.   oxyCODONE (OXY IR/ROXICODONE) 5 MG immediate release tablet Take 5 mg by mouth every 6 (six) hours as needed for pain.   potassium chloride SA (KLOR-CON) 20 MEQ tablet Take 2 tablets (40 mEq total) by mouth 2 (two) times daily.   predniSONE (DELTASONE) 5 MG tablet Take 1 tablet (5 mg total) by mouth 2 (two) times daily with a meal.   sildenafil (VIAGRA) 100 MG tablet Take 100 mg by mouth daily as needed for erectile dysfunction.   vitamin B-12 (CYANOCOBALAMIN) 1000 MCG tablet Take 1,000 mcg by mouth daily.   vitamin k 100 MCG tablet Take 100 mcg by mouth daily.     Allergies:   Patient has no known allergies.   Social History   Socioeconomic History   Marital status: Divorced    Spouse name: Not on file   Number of children: 1   Years of education: Not on file   Highest education level: Not on file  Occupational History   Not on file  Tobacco Use   Smoking status: Never   Smokeless tobacco: Former  Substance and Sexual Activity   Alcohol use: Not Currently   Drug use: Not Currently   Sexual activity: Not Currently  Other Topics Concern   Not on file  Social History Narrative   Not on file   Social Determinants of Health   Financial Resource Strain: Not on file  Food Insecurity: Not on file  Transportation Needs: Not on file  Physical Activity: Not on file  Stress: Not on file   Social Connections: Not on file     Family History: The patient's family history includes Breast cancer in his maternal grandmother; Heart attack in his father, paternal grandfather, and paternal uncle; Thrombosis in his maternal aunt, maternal grandmother, and maternal uncle.  ROS:   Please see the history of present illness.    All other systems reviewed and are negative.  EKGs/Labs/Other Studies Reviewed:    The following studies were reviewed today: IMPRESSION: 1. Coronary calcium score of 296. This was 96th percentile for age-, race-, and sex-matched controls.   2. Normal coronary origin  with right dominance.   3. Minimal plaque in LM and LAD. Mild (25-49%) RCA plaque. CAD-RADS 2.   4. Recommend aggressive risk factor modification, including LDL goal <70.   Chilton Si, MD     Electronically Signed   By: Chilton Si   On: 03/14/2021 13:52  IMPRESSIONS     1. Left ventricular ejection fraction, by estimation, is 55 to 60%. The  left ventricle has normal function. The left ventricle has no regional  wall motion abnormalities. There is mild concentric left ventricular  hypertrophy. Left ventricular diastolic  parameters were normal.   2. Right ventricular systolic function is normal. The right ventricular  size is normal. There is normal pulmonary artery systolic pressure.   3. The mitral valve is normal in structure. No evidence of mitral valve  regurgitation. No evidence of mitral stenosis.   4. The aortic valve has an indeterminant number of cusps. Aortic valve  regurgitation is not visualized. No aortic stenosis is present.   5. The inferior vena cava is normal in size with greater than 50%  respiratory variability, suggesting right atrial pressure of 3 mmHg.    Recent Labs: 03/21/2021: ALT 18; BUN 13; Creatinine 1.0; Hemoglobin 13.6; Platelets 131; Potassium 3.5; Sodium 145  Recent Lipid Panel No results found for: CHOL, TRIG, HDL, CHOLHDL,  VLDL, LDLCALC, LDLDIRECT  Physical Exam:    VS:  BP 114/80 (BP Location: Left Arm, Patient Position: Sitting, Cuff Size: Normal)   Pulse 76   Ht 5\' 11"  (1.803 m)   Wt 256 lb (116.1 kg)   SpO2 98%   BMI 35.70 kg/m     Wt Readings from Last 3 Encounters:  04/18/21 256 lb (116.1 kg)  03/21/21 270 lb 8 oz (122.7 kg)  03/07/21 270 lb (122.5 kg)     GEN: Patient is in no acute distress HEENT: Normal NECK: No JVD; No carotid bruits LYMPHATICS: No lymphadenopathy CARDIAC: Hear sounds regular, 2/6 systolic murmur at the apex. RESPIRATORY:  Clear to auscultation without rales, wheezing or rhonchi  ABDOMEN: Soft, non-tender, non-distended MUSCULOSKELETAL:  No edema; No deformity  SKIN: Warm and dry NEUROLOGIC:  Alert and oriented x 3 PSYCHIATRIC:  Normal affect   Signed, 05/08/21, MD  04/18/2021 9:05 AM    [facility]

## 2021-04-18 NOTE — Patient Instructions (Signed)
Medication Instructions:  No medication changes. *If you need a refill on your cardiac medications before your next appointment, please call your pharmacy*   Lab Work: None ordered If you have labs (blood work) drawn today and your tests are completely normal, you will receive your results only by: . MyChart Message (if you have MyChart) OR . A paper copy in the mail If you have any lab test that is abnormal or we need to change your treatment, we will call you to review the results.   Testing/Procedures: None ordered   Follow-Up: At [facility], you and your health needs are our priority.  As part of our continuing mission to provide you with exceptional heart care, we have created designated Provider Care Teams.  These Care Teams include your primary Cardiologist (physician) and Advanced Practice Providers (APPs -  Physician Assistants and Nurse Practitioners) who all work together to provide you with the care you need, when you need it.  We recommend signing up for the patient portal called "MyChart".  Sign up information is provided on this After Visit Summary.  MyChart is used to connect with patients for Virtual Visits (Telemedicine).  Patients are able to view lab/test results, encounter notes, upcoming appointments, etc.  Non-urgent messages can be sent to your provider as well.   To learn more about what you can do with MyChart, go to [email or web address].    Your next appointment:   1 month(s)  The format for your next appointment:   In Person  Provider:   Rajan Revankar, MD   Other Instructions NA  

## 2021-04-18 NOTE — Telephone Encounter (Signed)
Per 8/18 LOS next appt scheduled and given to patient 

## 2021-04-19 ENCOUNTER — Encounter: Payer: Self-pay | Admitting: Oncology

## 2021-04-21 ENCOUNTER — Encounter: Payer: Self-pay | Admitting: Oncology

## 2021-04-23 ENCOUNTER — Encounter: Payer: Self-pay | Admitting: Oncology

## 2021-04-24 NOTE — Progress Notes (Signed)
[facility]  [address] [place],  [place]  [address] [phone]  Clinic Day:  05/02/2021  Referring physician: Julianne Handler, NP  This document serves as a record of services personally performed by Gery Pray, MD. It was created on their behalf by Curry,Lauren E, a trained medical scribe. The creation of this record is based on the scribe's personal observations and the provider's statements to them.  CHIEF COMPLAINT:  CC:  Immune thrombocytopenic purpura  Current Treatment:   Prednisone 5 mg daily  HISTORY OF PRESENT ILLNESS:  Mike [name] is a 50 y.o. male with a history of severe thrombocytopenia, felt to represent immune thrombocytopenic purpura.  He has had thrombocytopenia, which has fluctuated up and down since June 2020.  Further laboratory evaluation did not reveal any other specific etiology for his thrombocytopenia.  He had mildly elevated liver transaminases.  Abdominal ultrasound revealed increased echogenicity of hepatic parenchyma suggesting hepatic steatosis, with probable fatty sparing adjacent to gallbladder fossa.  No other abnormality was seen.  We recommended bone marrow examination, but the patient and his mother declined.  The platelet count continued to fluctuate up and down, but as his platelet count remained 25,000 in June 2021, he was started on prednisone 20 mg 3 times daily, with a good response.  He has had leukocytosis secondary to the high-dose prednisone.  He also has had oral candidiasis on occasion. Malachi Carl titers were elevated, so this may be representative of chronic fatigue syndrome. We decreased his prednisone to 45 mg in late September and platelets remained stable at 97,000.  Since we were unable to taper him off the prednisone he was treated with rituximab weekly for 4 weeks in October and November 2021.  We have been slowly tapering the prednisone.  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  He has also had hypokalemia resolved with oral supplementation.    He was seen on May 11th 2022, as he had been ill the week before with a diarrheal illness and his platelet count was 179,000, so I decreased his prednisone to 10 mg daily.  He had recurrent bilateral conjunctivitis, so I placed him back on erythromycin ophthalmic ointment daily again.  On May 18th, his platelet count was 151,000, so I decreased his prednisone to 5 mg daily. He was admitted with COVID-19 on May 24th after leaving work earlier due to altered mental status and generalized weakness. He had acute kidney injury with a creatinine of 2.2, which normalized with IV hydration. He had fever, but no respiratory symptoms. He was discharged on a prolonged prednisone taper over 15 weeks.  He had been having some chest pain and dyspnea since discontinuing indapamide, so was referred to Dr. Tomie China in Cardiology. He placed him on furosemide 40 mg daily and metoprolol 50 mg daily.  He had had problems with hypokalemia for a long time but this worsened with the diuretic.  He also had him increase his potassium to 40 mg twice daily.  He states his lower extremity edema has resolved with the furosemide.  The ECHO shows left ventricular hypertrophy with an EF between 55-60%, and the CT of the coronary arteries looks good.  He is currently on gabapentin 300 mg BID.  He was on a extremely slow taper of the prednisone, but his platelets dropped to 73,000 when the dose was dropped to 5 mg three times per week.  I increased it back to  5 mg daily and stopped the gabapentin.    INTERVAL HISTORY:  Mike [name] is here for routine follow up and states that he is doing fairly well.  He continues prednisone 5 mg daily.  He continues to have back pain and is doing physical therapy.  He has seen Dr. Noel Geroldohen the spine specialist and states that they would like him to be placed on a steroid Dosepak.  He asks about taking  oral calcium supplement as he has discontinued TUMS due to possibility of affecting his platelet count.  His platelet count came up to 122,000 last week.  I recommended that he get on oral calcium supplement at 600 mg daily.  His still has some shortness of breath, but this has improved.  Platelet count has decreased from 122,000 to 97,000, hemoglobin has mildly decreased from 13.9 to 13.5, and white count is normal.  Chemistries are unremarkable except for a potassium of 3.4.  He is currently taking oral supplement 4 daily, but is on furosemide 40 mg daily.  I therefore advised that he increase to 6 daily, and will send in a new prescription.  His  appetite is good, and he has lost 1 pounds since his last visit.  He denies fever, chills or other signs of infection.  He denies nausea, vomiting, bowel issues, or abdominal pain.  He denies sore throat, cough, dyspnea, or chest pain.  REVIEW OF SYSTEMS:  Review of Systems  Constitutional: Negative.  Negative for appetite change, chills, fatigue, fever and unexpected weight change.  HENT:  Negative.    Eyes: Negative.   Respiratory:  Positive for shortness of breath (improved). Negative for chest tightness, cough, hemoptysis and wheezing.   Cardiovascular: Negative.  Negative for chest pain, leg swelling and palpitations.  Gastrointestinal: Negative.  Negative for abdominal distention, abdominal pain, blood in stool, constipation, diarrhea, nausea and vomiting.  Endocrine: Negative.   Genitourinary: Negative.  Negative for difficulty urinating, dysuria, frequency and hematuria.   Musculoskeletal:  Positive for back pain. Negative for arthralgias, flank pain, gait problem and myalgias.  Skin: Negative.   Neurological: Negative.  Negative for dizziness, extremity weakness, gait problem, headaches, light-headedness, numbness, seizures and speech difficulty.  Hematological: Negative.   Psychiatric/Behavioral: Negative.  Negative for depression and sleep  disturbance. The patient is not nervous/anxious.   All other systems reviewed and are negative.  VITALS:  Blood pressure (!) 142/78, pulse 74, temperature 98 F (36.7 C), temperature source Oral, resp. rate 16, height 5\' 11"  (1.803 m), weight 253 lb (114.8 kg), SpO2 98 %.  Wt Readings from Last 3 Encounters:  05/02/21 253 lb (114.8 kg)  04/18/21 253 lb 9.6 oz (115 kg)  04/18/21 256 lb (116.1 kg)    Body mass index is 35.29 kg/m.  Performance status (ECOG): 1 - Symptomatic but completely ambulatory  PHYSICAL EXAM:  Physical Exam Constitutional:      General: He is not in acute distress.    Appearance: Normal appearance. He is normal weight.  HENT:     Head: Normocephalic and atraumatic.  Eyes:     General: No scleral icterus.    Extraocular Movements: Extraocular movements intact.     Conjunctiva/sclera: Conjunctivae normal.     Pupils: Pupils are equal, round, and reactive to light.  Cardiovascular:     Rate and Rhythm: Normal rate and regular rhythm.     Pulses: Normal pulses.     Heart sounds: Normal heart sounds. No murmur heard.   No friction rub. No gallop.  Pulmonary:     Effort: Pulmonary effort is normal. No respiratory distress.     Breath sounds: Normal breath sounds.  Abdominal:     General: Bowel sounds are normal. There is no distension.     Palpations: Abdomen is soft. There is no hepatomegaly, splenomegaly or mass.     Tenderness: There is no abdominal tenderness.  Musculoskeletal:        General: Normal range of motion.     Cervical back: Normal range of motion and neck supple.     Right lower leg: No edema.     Left lower leg: No edema.  Lymphadenopathy:     Cervical: No cervical adenopathy.  Skin:    General: Skin is warm and dry.  Neurological:     General: No focal deficit present.     Mental Status: He is alert and oriented to person, place, and time. Mental status is at baseline.  Psychiatric:        Mood and Affect: Mood normal.         Behavior: Behavior normal.        Thought Content: Thought content normal.        Judgment: Judgment normal.   LABS:   CBC Latest Ref Rng & Units 05/02/2021 04/25/2021 04/18/2021  WBC - 9.9 9.5 9.7  Hemoglobin 13.5 - 17.5 13.5 13.9 14.1  Hematocrit 41 - 53 40(A) 41 42  Platelets 150 - 399 97(A) 122(A) 73(A)   CMP Latest Ref Rng & Units 05/02/2021 04/25/2021 04/18/2021  Glucose 65 - 99 mg/dL - - -  BUN 4 - -  Creatinine 0.6 - 1.3 0.9 1.0 -  Sodium 137 - 147 143 141 -  Potassium 3.4 - 5.3 3.4 3.5 -  Chloride 99 - 108 105 101 -  CO2 13 - 22 23(A) 25(A) -  Calcium 8.7 - 10.7 8.5(A) 8.7 -  Total Protein 6.5 - 8.1 g/dL - - 7.9  Total Bilirubin 0.3 - 1.2 mg/dL - - 1.2  Alkaline Phos 25 - 125 64 70 61  AST 14 - 40 ALT 10 - 40 STUDIES:  No results found.    HISTORY:   Allergies: No Known Allergies  Current Medications: Current Outpatient Medications  Medication Sig Dispense Refill   acetaminophen (TYLENOL) 500 MG tablet Take 500 mg by mouth every 4 (four) hours as needed for moderate pain.     albuterol (VENTOLIN HFA) 108 (90 Base) MCG/ACT inhaler Inhale 2 puffs into the lungs every 4 (four) hours as needed for wheezing or shortness of breath.     amLODipine (NORVASC) 10 MG tablet Take 10 mg by mouth daily.     Ascorbic Acid (VITAMIN C) 1000 MG tablet Take 1,000 mg by mouth daily.     benazepril (LOTENSIN) 20 MG tablet Take 1 tablet (20 mg total) by mouth daily. 90 tablet 3   calcium carbonate (TUMS - DOSED IN MG ELEMENTAL CALCIUM) 500 MG chewable tablet Chew 2 tablets by mouth 2 (two) times daily.     cholecalciferol (VITAMIN D3) 25 MCG (1000 UNIT) tablet Take 1,000 Units by mouth daily.     escitalopram (LEXAPRO) 10 MG tablet Take 10 mg by mouth daily.     furosemide (LASIX) 40 MG tablet Take 1 tablet (40 mg total) by mouth daily. 90 tablet 3   gabapentin (NEURONTIN) 300 MG capsule Take 300 mg by mouth 3 (three) times daily.  glipiZIDE (GLUCOTROL  XL) 10 MG 24 hr tablet Take 10 mg by mouth daily.     metFORMIN (GLUCOPHAGE-XR) 500 MG 24 hr tablet Take 1,000 mg by mouth 2 (two) times daily.     metoprolol succinate (TOPROL-XL) 100 MG 24 hr tablet Take 1 tablet (100 mg total) by mouth daily. Take with or immediately following a meal. 90 tablet 3   omeprazole (PRILOSEC) 20 MG capsule Take 20 mg by mouth daily.     oxyCODONE (OXY IR/ROXICODONE) 5 MG immediate release tablet Take 5 mg by mouth every 6 (six) hours as needed for pain.     potassium chloride SA (KLOR-CON) 20 MEQ tablet Take 2 tablets (40 mEq total) by mouth 3 (three) times daily. 180 tablet 5   predniSONE (DELTASONE) 5 MG tablet Take 1 tablet (5 mg total) by mouth 2 (two) times daily with a meal. (Patient taking differently: Take 5 mg by mouth daily with breakfast. Per Dr. Gilman Buttner) 100 tablet 1   sildenafil (VIAGRA) 100 MG tablet Take 100 mg by mouth daily as needed for erectile dysfunction.     vitamin B-12 (CYANOCOBALAMIN) 1000 MCG tablet Take 1,000 mcg by mouth daily.     vitamin k 100 MCG tablet Take 100 mcg by mouth daily.     No current facility-administered medications for this visit.     ASSESSMENT & PLAN:   Assessment:  1. Significant thrombocytopenia, which has been fluctuating up and down since June 2020.  This is immune thrombocytopenic purpura.  We placed him on prednisone 20 mg three times daily in early June.  We were unable to taper his steroids, so he underwent rituximab therapy weekly for 4 weeks in October 2021 with a partial response.  He is not a good candidate for splenectomy unless absolutely necessary.  His platelet count has remained above 75,000, we have steadily tapered his prednisone and prior to his recent hospital admission, he was on prednisone 5mg  daily. He was admitted for COVID and placed back on high dose prednisone, with an extremely slow taper.  As his platelet count has worsened in August 2022, I advised that he increase the dose back to 5 mg  daily, and hope to eventually taper him off completely.       2. Steroid induced diabetes, for which he continues metformin 500 mg twice daily and glipizide 5 mg daily.     3.  Elevated Epstein Barr titers.  This may be indicative of chronic fatigue syndrome, and I advised that he rest as needed.  We had a discussion about this diagnosis.  His sister has him taking zinc, lemon balm, and L lysine.   4.  Sleep apnea, untreated. I have encouraged him to use his CPAP mask for better quality of life and to reduce his daytime sleepiness.  He has been referred to  Pulmonary.   5.  Chronic anxiety, mainly related to his health.  6.  Admission with acute kidney injury due to dehydration associated with COVID-19, May 2022.   He was placed on an extremely slow prednisone taper.    7.  Shortness of breath with exertion which persists despite a good cardiac evaluation.  He is following with pulmonology.  8.  Significant left hip and lower back pain.  He is following with Dr. June 2022, spine and scoliosis specialists.  They have recommended a steroid Dosepak.  He is undergoing physical therapy.  9.  Anemia, mild but worsening.  I will add B12 and folate to  his labs today for further evaluation.  10.  Hypertension.   Plan:     He will continue prednisone dose to 5 mg daily.  He will continue to follow with Dr. Tomie China, Dr. Noel Gerold, and pulmonology.  In regards to his worsening anemia, I will check B12 and folate.  He will return in 2 weeks for CBC and CMP.  I will plan to see him back in 4 weeks with a CBC and comprehensive metabolic panel. The patient and his sister understand the plans discussed today and are in agreement with them. They know to contact our office if he develops concerns prior to his next appointment.  I provided 15 minutes of face-to-face time during this this encounter and > 50% was spent counseling as documented under my assessment and plan.    I, Foye Deer, am acting as scribe for  Dellia Beckwith, MD  I have reviewed this report as typed by the medical scribe, and it is complete and accurate.  Gery Pray, MD [facility]

## 2021-04-25 ENCOUNTER — Telehealth: Payer: Self-pay

## 2021-04-25 ENCOUNTER — Other Ambulatory Visit: Payer: Self-pay

## 2021-04-25 ENCOUNTER — Inpatient Hospital Stay: Payer: 59

## 2021-04-25 ENCOUNTER — Encounter: Payer: Self-pay | Admitting: Hematology and Oncology

## 2021-04-25 DIAGNOSIS — D693 Immune thrombocytopenic purpura: Secondary | ICD-10-CM

## 2021-04-25 LAB — COMPREHENSIVE METABOLIC PANEL
Albumin: 4.7 (ref 3.5–5.0)
Calcium: 8.7 (ref 8.7–10.7)

## 2021-04-25 LAB — CBC: RBC: 4.95 (ref 3.87–5.11)

## 2021-04-25 LAB — HEPATIC FUNCTION PANEL
ALT: 17 (ref 10–40)
AST: 21 (ref 14–40)
Alkaline Phosphatase: 70 (ref 25–125)
Bilirubin, Total: 0.9

## 2021-04-25 LAB — BASIC METABOLIC PANEL
BUN: 15 (ref 4–21)
CO2: 25 — AB (ref 13–22)
Chloride: 101 (ref 99–108)
Creatinine: 1 (ref 0.6–1.3)
Glucose: 99
Potassium: 3.5 (ref 3.4–5.3)
Sodium: 141 (ref 137–147)

## 2021-04-25 LAB — CBC AND DIFFERENTIAL
HCT: 41 (ref 41–53)
Hemoglobin: 13.9 (ref 13.5–17.5)
Neutrophils Absolute: 5.51
Platelets: 122 — AB (ref 150–399)
WBC: 9.5

## 2021-04-25 NOTE — Telephone Encounter (Signed)
Called patient on home phone number at the request of patient to tell him his platelet count results from today's lab draw. Pt answered and identified himself via two identifications. Pt was pleased to hear his platelet count has increased from 73 (last lab draw 04/18/21) to 122 (today's lab draw 04/25/21). Call complete.

## 2021-05-01 ENCOUNTER — Encounter: Payer: Self-pay | Admitting: Oncology

## 2021-05-02 ENCOUNTER — Other Ambulatory Visit: Payer: Self-pay | Admitting: Oncology

## 2021-05-02 ENCOUNTER — Telehealth: Payer: Self-pay | Admitting: Oncology

## 2021-05-02 ENCOUNTER — Inpatient Hospital Stay: Payer: 59

## 2021-05-02 ENCOUNTER — Inpatient Hospital Stay: Payer: 59 | Attending: Oncology | Admitting: Oncology

## 2021-05-02 ENCOUNTER — Other Ambulatory Visit: Payer: Self-pay | Admitting: Hematology and Oncology

## 2021-05-02 VITALS — BP 142/78 | HR 74 | Temp 98.0°F | Resp 16 | Ht 71.0 in | Wt 253.0 lb

## 2021-05-02 DIAGNOSIS — T502X5A Adverse effect of carbonic-anhydrase inhibitors, benzothiadiazides and other diuretics, initial encounter: Secondary | ICD-10-CM

## 2021-05-02 DIAGNOSIS — R0602 Shortness of breath: Secondary | ICD-10-CM | POA: Insufficient documentation

## 2021-05-02 DIAGNOSIS — E098 Drug or chemical induced diabetes mellitus with unspecified complications: Secondary | ICD-10-CM | POA: Insufficient documentation

## 2021-05-02 DIAGNOSIS — M545 Low back pain, unspecified: Secondary | ICD-10-CM | POA: Diagnosis not present

## 2021-05-02 DIAGNOSIS — D649 Anemia, unspecified: Secondary | ICD-10-CM | POA: Diagnosis not present

## 2021-05-02 DIAGNOSIS — Z7952 Long term (current) use of systemic steroids: Secondary | ICD-10-CM | POA: Diagnosis not present

## 2021-05-02 DIAGNOSIS — R7401 Elevation of levels of liver transaminase levels: Secondary | ICD-10-CM | POA: Diagnosis not present

## 2021-05-02 DIAGNOSIS — D693 Immune thrombocytopenic purpura: Secondary | ICD-10-CM | POA: Insufficient documentation

## 2021-05-02 DIAGNOSIS — I10 Essential (primary) hypertension: Secondary | ICD-10-CM | POA: Diagnosis not present

## 2021-05-02 DIAGNOSIS — Z7984 Long term (current) use of oral hypoglycemic drugs: Secondary | ICD-10-CM | POA: Insufficient documentation

## 2021-05-02 DIAGNOSIS — D529 Folate deficiency anemia, unspecified: Secondary | ICD-10-CM

## 2021-05-02 DIAGNOSIS — Z8616 Personal history of COVID-19: Secondary | ICD-10-CM | POA: Insufficient documentation

## 2021-05-02 DIAGNOSIS — F419 Anxiety disorder, unspecified: Secondary | ICD-10-CM | POA: Diagnosis not present

## 2021-05-02 DIAGNOSIS — G4730 Sleep apnea, unspecified: Secondary | ICD-10-CM | POA: Insufficient documentation

## 2021-05-02 DIAGNOSIS — N179 Acute kidney failure, unspecified: Secondary | ICD-10-CM | POA: Insufficient documentation

## 2021-05-02 DIAGNOSIS — E876 Hypokalemia: Secondary | ICD-10-CM

## 2021-05-02 HISTORY — DX: Adverse effect of carbonic-anhydrase inhibitors, benzothiadiazides and other diuretics, initial encounter: T50.2X5A

## 2021-05-02 HISTORY — DX: Hypokalemia: E87.6

## 2021-05-02 HISTORY — DX: Folate deficiency anemia, unspecified: D52.9

## 2021-05-02 LAB — VITAMIN B12: Vitamin B-12: 930 pg/mL — ABNORMAL HIGH (ref 180–914)

## 2021-05-02 LAB — COMPREHENSIVE METABOLIC PANEL
Albumin: 4.7 (ref 3.5–5.0)
Calcium: 8.5 — AB (ref 8.7–10.7)

## 2021-05-02 LAB — BASIC METABOLIC PANEL
BUN: 15 (ref 4–21)
CO2: 23 — AB (ref 13–22)
Chloride: 105 (ref 99–108)
Creatinine: 0.9 (ref 0.6–1.3)
Glucose: 98
Potassium: 3.4 (ref 3.4–5.3)
Sodium: 143 (ref 137–147)

## 2021-05-02 LAB — HEPATIC FUNCTION PANEL
ALT: 15 (ref 10–40)
AST: 22 (ref 14–40)
Alkaline Phosphatase: 64 (ref 25–125)
Bilirubin, Total: 0.9

## 2021-05-02 LAB — CBC AND DIFFERENTIAL
HCT: 40 — AB (ref 41–53)
Hemoglobin: 13.5 (ref 13.5–17.5)
Neutrophils Absolute: 6.14
Platelets: 97 — AB (ref 150–399)
WBC: 9.9

## 2021-05-02 LAB — FOLATE: Folate: 4.7 ng/mL — ABNORMAL LOW (ref >5.9)

## 2021-05-02 LAB — CBC: RBC: 4.78 (ref 3.87–5.11)

## 2021-05-02 MED ORDER — FOLIC ACID 1 MG PO TABS: 1.0000 mg | ORAL_TABLET | Freq: Every day | ORAL | 3 refills | Status: DC

## 2021-05-02 MED ORDER — POTASSIUM CHLORIDE CRYS ER 20 MEQ PO TBCR: 40.0000 meq | EXTENDED_RELEASE_TABLET | Freq: Three times a day (TID) | ORAL | 5 refills | Status: DC

## 2021-05-02 NOTE — Telephone Encounter (Signed)
Per 9/1 LOS NEXT APPT SCHEDULED AND GIVEN TO PATIENT 

## 2021-05-03 ENCOUNTER — Telehealth: Payer: Self-pay

## 2021-05-03 NOTE — Telephone Encounter (Signed)
-----   Message from Dellia Beckwith, MD sent at 05/02/2021  7:07 PM EDT ----- Regarding: call pt Tell him B12 is fine but his folic acid is low so I will send in Rx for 1 mg daily.  This is a vitamin & won't have any side effects, but may help his anemia and platelets

## 2021-05-05 ENCOUNTER — Encounter: Payer: Self-pay | Admitting: Oncology

## 2021-05-07 ENCOUNTER — Encounter: Payer: Self-pay | Admitting: Oncology

## 2021-05-07 ENCOUNTER — Telehealth: Payer: Self-pay

## 2021-05-07 NOTE — Telephone Encounter (Signed)
-----   Message from Christine H McCarty, MD sent at 05/02/2021  7:07 PM EDT ----- Regarding: call pt Tell him B12 is fine but his folic acid is low so I will send in Rx for 1 mg daily.  This is a vitamin & won't have any side effects, but may help his anemia and platelets  

## 2021-05-08 ENCOUNTER — Telehealth: Payer: Self-pay

## 2021-05-08 NOTE — Telephone Encounter (Signed)
-----   Message from Christine H McCarty, MD sent at 05/02/2021  7:07 PM EDT ----- Regarding: call pt Tell him B12 is fine but his folic acid is low so I will send in Rx for 1 mg daily.  This is a vitamin & won't have any side effects, but may help his anemia and platelets  

## 2021-05-15 ENCOUNTER — Telehealth: Payer: Self-pay | Admitting: Cardiology

## 2021-05-15 DIAGNOSIS — R931 Abnormal findings on diagnostic imaging of heart and coronary circulation: Secondary | ICD-10-CM

## 2021-05-15 DIAGNOSIS — I10 Essential (primary) hypertension: Secondary | ICD-10-CM

## 2021-05-15 NOTE — Telephone Encounter (Signed)
Labs faxed

## 2021-05-15 NOTE — Telephone Encounter (Signed)
Mike [name], Sister of the patient called. She asked if we could fax the patient's lab orders to the [facility].  Please fax lab orders to [phone]

## 2021-05-16 ENCOUNTER — Telehealth: Payer: Self-pay

## 2021-05-16 ENCOUNTER — Inpatient Hospital Stay: Payer: 59

## 2021-05-16 ENCOUNTER — Encounter: Payer: Self-pay | Admitting: Oncology

## 2021-05-16 ENCOUNTER — Other Ambulatory Visit: Payer: Self-pay | Admitting: Hematology and Oncology

## 2021-05-16 DIAGNOSIS — D693 Immune thrombocytopenic purpura: Secondary | ICD-10-CM

## 2021-05-16 LAB — CBC AND DIFFERENTIAL
HCT: 42 (ref 41–53)
Hemoglobin: 14 (ref 13.5–17.5)
Neutrophils Absolute: 6.8
Platelets: 83 — AB (ref 150–399)
WBC: 10

## 2021-05-16 LAB — HEPATIC FUNCTION PANEL
ALT: 15 (ref 10–40)
AST: 22 (ref 14–40)
Alkaline Phosphatase: 72 (ref 25–125)
Bilirubin, Direct: 0.3 (ref 0.01–0.4)
Bilirubin, Total: 1.2

## 2021-05-16 LAB — BASIC METABOLIC PANEL
BUN: 14 (ref 4–21)
CO2: 28 — AB (ref 13–22)
Chloride: 101 (ref 99–108)
Creatinine: 1 (ref 0.6–1.3)
Glucose: 109
Potassium: 4.1 (ref 3.4–5.3)
Sodium: 142 (ref 137–147)

## 2021-05-16 LAB — COMPREHENSIVE METABOLIC PANEL
Albumin: 4.7 (ref 3.5–5.0)
Calcium: 9 (ref 8.7–10.7)

## 2021-05-16 LAB — CBC
MCV: 83 (ref 80–94)
RBC: 5.01 (ref 3.87–5.11)

## 2021-05-16 LAB — LIPID PANEL
Cholesterol: 172 (ref 0–200)
HDL: 32 — AB (ref 35–70)
LDL Cholesterol: 113
LDl/HDL Ratio: 5.4
Triglycerides: 137 (ref 40–160)

## 2021-05-16 LAB — TSH: TSH: 2.08 (ref 0.41–5.90)

## 2021-05-16 NOTE — Telephone Encounter (Signed)
I called patient home number that is listed in his chart. Patient answered and identified himself x2. I stated that his plat count was 83, and his last plat count that was on 9/1 was 97. Pt stated he understood same and would call with any questions. Call complete.

## 2021-05-17 ENCOUNTER — Encounter: Payer: Self-pay | Admitting: Oncology

## 2021-05-17 ENCOUNTER — Encounter: Payer: Self-pay | Admitting: Cardiology

## 2021-05-17 ENCOUNTER — Ambulatory Visit (INDEPENDENT_AMBULATORY_CARE_PROVIDER_SITE_OTHER): Payer: 59 | Admitting: Cardiology

## 2021-05-17 ENCOUNTER — Other Ambulatory Visit: Payer: Self-pay

## 2021-05-17 VITALS — BP 138/68 | HR 70 | Ht 71.0 in | Wt 249.6 lb

## 2021-05-17 DIAGNOSIS — E782 Mixed hyperlipidemia: Secondary | ICD-10-CM | POA: Insufficient documentation

## 2021-05-17 DIAGNOSIS — I2510 Atherosclerotic heart disease of native coronary artery without angina pectoris: Secondary | ICD-10-CM

## 2021-05-17 DIAGNOSIS — I10 Essential (primary) hypertension: Secondary | ICD-10-CM | POA: Diagnosis not present

## 2021-05-17 DIAGNOSIS — E669 Obesity, unspecified: Secondary | ICD-10-CM

## 2021-05-17 DIAGNOSIS — G4730 Sleep apnea, unspecified: Secondary | ICD-10-CM

## 2021-05-17 HISTORY — DX: Mixed hyperlipidemia: E78.2

## 2021-05-17 HISTORY — DX: Atherosclerotic heart disease of native coronary artery without angina pectoris: I25.10

## 2021-05-17 MED ORDER — ROSUVASTATIN CALCIUM 10 MG PO TABS: 10.0000 mg | ORAL_TABLET | Freq: Every day | ORAL | 3 refills | Status: DC

## 2021-05-17 NOTE — Patient Instructions (Signed)
Medication Instructions:  Your physician has recommended you make the following change in your medication:   Start Crestor 10 mg daily.  *If you need a refill on your cardiac medications before your next appointment, please call your pharmacy*   Lab Work: Your physician recommends that you return for lab work in: 6 weeks. You need to have labs done when you are fasting.  You can come Monday through Friday 8:30 am to 12:00 pm and 1:15 to 4:30. You do not need to make an appointment as the order has already been placed. The labs you are going to have done are BMET,  LFT and Lipids.  If you have labs (blood work) drawn today and your tests are completely normal, you will receive your results only by: MyChart Message (if you have MyChart) OR A paper copy in the mail If you have any lab test that is abnormal or we need to change your treatment, we will call you to review the results.   Testing/Procedures: None ordered   Follow-Up: At [facility], you and your health needs are our priority.  As part of our continuing mission to provide you with exceptional heart care, we have created designated Provider Care Teams.  These Care Teams include your primary Cardiologist (physician) and Advanced Practice Providers (APPs -  Physician Assistants and Nurse Practitioners) who all work together to provide you with the care you need, when you need it.  We recommend signing up for the patient portal called "MyChart".  Sign up information is provided on this After Visit Summary.  MyChart is used to connect with patients for Virtual Visits (Telemedicine).  Patients are able to view lab/test results, encounter notes, upcoming appointments, etc.  Non-urgent messages can be sent to your provider as well.   To learn more about what you can do with MyChart, go to [email or web address].    Your next appointment:   6 month(s)  The format for your next appointment:   In Person  Provider:   Belva Crome, MD   Other Instructions NA

## 2021-05-17 NOTE — Progress Notes (Signed)
Cardiology Office Note:    Date:  05/17/2021   ID:  Mike [name], DOB 10/26/1970, MRN [id number]  PCP:  Julianne Handler, NP  Cardiologist:  Garwin Brothers, MD   Referring MD: Julianne Handler, NP    ASSESSMENT:    1. Primary hypertension   2. Coronary artery disease involving native coronary artery of native heart without angina pectoris   3. Sleep apnea, unspecified type   4. Obesity (BMI 35.0-39.9 without comorbidity)   5. Mixed dyslipidemia    PLAN:    In order of problems listed above:  Coronary artery disease: Secondary prevention stressed with the patient.  Importance of compliance with diet medication stressed and he vocalized understanding.  He was advised to walk at least half an hour a day 5 days a week and he promises to do so. Essential hypertension: Blood pressure stable and lifestyle modification was urged.  Salt intake issues and diet was emphasized. Mixed dyslipidemia: Lipids reviewed diet emphasized.  I told him to start rosuvastatin 10 mg daily and liver lipid check follow-up in 6 weeks.  I told him to check with his pharmacist and his hematologist about this medicine and ITP and he promises to do so. Obesity: Weight reduction stressed and diet was emphasized and he promises to do better. Patient will be seen in follow-up appointment in 6 months or earlier if the patient has any concerns    Medication Adjustments/Labs and Tests Ordered: Current medicines are reviewed at length with the patient today.  Concerns regarding medicines are outlined above.  No orders of the defined types were placed in this encounter.  No orders of the defined types were placed in this encounter.    No chief complaint on file.    History of Present Illness:    Mike [name] is a 50 y.o. male.  Patient has history of elevated calcium score and significant atherosclerosis of the coronary arteries, essential hypertension, dyslipidemia and ITP.  He denies any problems at this  time and takes care of activities of daily living.  No chest pain orthopnea or PND.  He has sleep apnea.  He is taking his best to lose weight.  At the time of my evaluation, the patient is alert awake oriented and in no distress.  Past Medical History:  Diagnosis Date   Acute kidney injury (HCC)    Allergic rhinitis with postnasal drip    Allergy    Anemia    Diuretic-induced hypokalemia 05/02/2021   Dyspnea on exertion    Folate deficiency anemia 05/02/2021   GERD (gastroesophageal reflux disease)    GERD without esophagitis    Hypertension    Hyponatremia    Immune thrombocytopenic purpura (HCC)    Immune thrombocytopenic purpura (HCC)    Metabolic syndrome    Morbid obesity (HCC)    Obesity (BMI 35.0-39.9 without comorbidity) 04/18/2021   Obstructive sleep apnea hypopnea, moderate    Post-COVID syndrome    Shortness of breath    Sleep apnea    wears a mouth piece   Steroid-induced diabetes (HCC)    Stress and adjustment reaction    Thrombocytopenia, unspecified (HCC)     Past Surgical History:  Procedure Laterality Date   NO PAST SURGERIES      Current Medications: Current Meds  Medication Sig   acetaminophen (TYLENOL) 500 MG tablet Take 500 mg by mouth every 4 (four) hours as needed for moderate pain.   albuterol (VENTOLIN HFA) 108 (90 Base) MCG/ACT  inhaler Inhale 2 puffs into the lungs every 4 (four) hours as needed for wheezing or shortness of breath.   amLODipine (NORVASC) 10 MG tablet Take 10 mg by mouth daily.   Ascorbic Acid (VITAMIN C) 1000 MG tablet Take 1,000 mg by mouth daily.   benazepril (LOTENSIN) 20 MG tablet Take 1 tablet (20 mg total) by mouth daily.   CALCIUM PO Take 1 tablet by mouth daily.   cholecalciferol (VITAMIN D3) 25 MCG (1000 UNIT) tablet Take 1,000 Units by mouth daily.   escitalopram (LEXAPRO) 10 MG tablet Take 10 mg by mouth daily.   folic acid (FOLVITE) 1 MG tablet Take 1 tablet (1 mg total) by mouth daily.   furosemide (LASIX) 40 MG tablet  Take 1 tablet (40 mg total) by mouth daily.   glipiZIDE (GLUCOTROL XL) 10 MG 24 hr tablet Take 10 mg by mouth daily.   metFORMIN (GLUCOPHAGE-XR) 500 MG 24 hr tablet Take 1,000 mg by mouth 2 (two) times daily.   metoprolol succinate (TOPROL-XL) 100 MG 24 hr tablet Take 1 tablet (100 mg total) by mouth daily. Take with or immediately following a meal.   omeprazole (PRILOSEC) 20 MG capsule Take 20 mg by mouth daily.   oxyCODONE (OXY IR/ROXICODONE) 5 MG immediate release tablet Take 5 mg by mouth every 6 (six) hours as needed for pain.   potassium chloride SA (KLOR-CON) 20 MEQ tablet Take 2 tablets (40 mEq total) by mouth 3 (three) times daily.   predniSONE (DELTASONE) 5 MG tablet Take 1 tablet (5 mg total) by mouth 2 (two) times daily with a meal. (Patient taking differently: Take 5 mg by mouth daily with breakfast. Per Dr. Gilman Buttner)   sildenafil (VIAGRA) 100 MG tablet Take 100 mg by mouth daily as needed for erectile dysfunction.   vitamin B-12 (CYANOCOBALAMIN) 1000 MCG tablet Take 1,000 mcg by mouth daily.   vitamin k 100 MCG tablet Take 100 mcg by mouth daily.     Allergies:   Patient has no known allergies.   Social History   Socioeconomic History   Marital status: Divorced    Spouse name: Not on file   Number of children: 1   Years of education: Not on file   Highest education level: Not on file  Occupational History   Not on file  Tobacco Use   Smoking status: Never   Smokeless tobacco: Former  Substance and Sexual Activity   Alcohol use: Not Currently   Drug use: Not Currently   Sexual activity: Not Currently  Other Topics Concern   Not on file  Social History Narrative   Not on file   Social Determinants of Health   Financial Resource Strain: Not on file  Food Insecurity: Not on file  Transportation Needs: Not on file  Physical Activity: Not on file  Stress: Not on file  Social Connections: Not on file     Family History: The patient's family history includes  Breast cancer in his maternal grandmother; Heart attack in his father, paternal grandfather, and paternal uncle; Thrombosis in his maternal aunt, maternal grandmother, and maternal uncle.  ROS:   Please see the history of present illness.    All other systems reviewed and are negative.  EKGs/Labs/Other Studies Reviewed:    The following studies were reviewed today: REPORT: 03/14/2021 13:52   CLINICAL DATA:  87M with hypertension and OSA.   EXAM: Cardiac/Coronary  CT   TECHNIQUE: The patient was scanned on a [employer].   FINDINGS:  A 120 kV prospective scan was triggered in the descending thoracic aorta at 111 HU's. Axial non-contrast 3 mm slices were carried out through the heart. The data set was analyzed on a dedicated work station and scored using the Agatson method. Gantry rotation speed was 250 msecs and collimation was .6 mm. No beta blockade and 0.8 mg of sl NTG was given. The 3D data set was reconstructed in 5% intervals of the 67-82 % of the R-R cycle. Diastolic phases were analyzed on a dedicated work station using MPR, MIP and VRT modes. The patient received 80 cc of contrast.   Aorta: Normal size.  2.6 cm.  Minimal calcification.  No dissection.   Aortic Valve:  Trileaflet.  No calcifications.   Coronary Arteries:  Normal coronary origin.  Right dominance.   RCA is a large dominant artery that gives rise to PDA and PLVB. There is minimal (<25%) calcified plaque proximally and mild (25-49%) calcified plaque in the mid RCA. There is minimal (<25%) calcification of the PDA.   Left main is a large artery that gives rise to LAD and LCX arteries. There is minimal (<25%) calcified plaque.   LAD is a large vessel that has minimal (<25%) calcified plaque in the mid LAD.   LCX is a non-dominant artery that gives rise to two OM branches. There is no plaque.   Coronary Calcium Score:   Left main: 61.7   Left anterior descending artery: 22.8   Left  circumflex artery: 7.86   Right coronary artery: 197   Total: 296   Percentile: 96th   Other findings:   Normal pulmonary vein drainage into the left atrium.   Normal let atrial appendage without a thrombus.   Normal size of the pulmonary artery.   IMPRESSION: 1. Coronary calcium score of 296. This was 96th percentile for age-, race-, and sex-matched controls.   2. Normal coronary origin with right dominance.   3. Minimal plaque in LM and LAD. Mild (25-49%) RCA plaque. CAD-RADS 2.   4. Recommend aggressive risk factor modification, including LDL goal <70.   Chilton Si, MD     Electronically Signed   By: Chilton Si   On: 03/14/2021 13:52    Addended by Chilton Si, MD on 03/14/2021  1:54 PM   Study Result  Narrative & Impression  EXAM: OVER-READ INTERPRETATION  CT CHEST   The following report is an over-read performed by radiologist Dr. Trudie Reed of [facility] on 03/14/2021. This over-read does not include interpretation of cardiac or coronary anatomy or pathology. The coronary calcium score/coronary CTA interpretation by the cardiologist is attached.   COMPARISON:  No priors.   FINDINGS: Mild linear scarring in the left lower lobe. Atherosclerotic calcifications in the thoracic aorta. Within the visualized portions of the thorax there are no suspicious appearing pulmonary nodules or masses, there is no acute consolidative airspace disease, no pleural effusions, no pneumothorax and no lymphadenopathy. Visualized portions of the upper abdomen are unremarkable. There are no aggressive appearing lytic or blastic lesions noted in the visualized portions of the skeleton.   IMPRESSION: 1.  Aortic Atherosclerosis (ICD10-I70.0).   Electronically Signed: By: Trudie Reed M.D. On: 03/14/2021 09:43     Recent Labs: 05/16/2021: ALT 15; BUN 14; Creatinine 1.0; Hemoglobin 14.0; Platelets 83; Potassium 4.1; Sodium 142; TSH  2.08  Recent Lipid Panel    Component Value Date/Time   CHOL 172 05/16/2021 0000   TRIG 137 05/16/2021 0000   HDL 32 (  A) 05/16/2021 0000   CHOLHDL 5.7 04/18/2021 0925   VLDL 27 04/18/2021 0925   LDLCALC 113 05/16/2021 0000    Physical Exam:    VS:  BP 138/68   Pulse 70   Ht 5\' 11"  (1.803 m)   Wt 249 lb 9.6 oz (113.2 kg)   SpO2 97%   BMI 34.81 kg/m     Wt Readings from Last 3 Encounters:  05/17/21 249 lb 9.6 oz (113.2 kg)  05/02/21 253 lb (114.8 kg)  04/18/21 253 lb 9.6 oz (115 kg)     GEN: Patient is in no acute distress HEENT: Normal NECK: No JVD; No carotid bruits LYMPHATICS: No lymphadenopathy CARDIAC: Hear sounds regular, 2/6 systolic murmur at the apex. RESPIRATORY:  Clear to auscultation without rales, wheezing or rhonchi  ABDOMEN: Soft, non-tender, non-distended MUSCULOSKELETAL:  No edema; No deformity  SKIN: Warm and dry NEUROLOGIC:  Alert and oriented x 3 PSYCHIATRIC:  Normal affect   Signed, 04/20/21, MD  05/17/2021 8:17 AM    [facility]

## 2021-05-22 ENCOUNTER — Telehealth: Payer: Self-pay

## 2021-05-22 NOTE — Telephone Encounter (Signed)
Pt's sister came by to let us know that the pt's platelets are at 62 and that the pharmacist does not recommend the pt taking Crestor. How do you advise?

## 2021-05-22 NOTE — Telephone Encounter (Signed)
Message sent via MyChart.

## 2021-05-23 NOTE — Progress Notes (Signed)
[facility]  [address] [place],  [place]  [address] [phone]  Clinic Day:  05/30/2021  Referring physician: Julianne Handler, NP  This document serves as a record of services personally performed by Gery Pray, MD. It was created on their behalf by Curry,Lauren E, a trained medical scribe. The creation of this record is based on the scribe's personal observations and the provider's statements to them.  CHIEF COMPLAINT:  CC:  Immune thrombocytopenic purpura  Current Treatment:   Prednisone 5 mg daily  HISTORY OF PRESENT ILLNESS:  Mike [name] is a 50 y.o. male with a history of severe thrombocytopenia, felt to represent immune thrombocytopenic purpura.  He has had thrombocytopenia, which has fluctuated up and down since June 2020.  Further laboratory evaluation did not reveal any other specific etiology for his thrombocytopenia.  He had mildly elevated liver transaminases.  Abdominal ultrasound revealed increased echogenicity of hepatic parenchyma suggesting hepatic steatosis, with probable fatty sparing adjacent to gallbladder fossa.  No other abnormality was seen.  We recommended bone marrow examination, but the patient and his mother declined.  The platelet count continued to fluctuate up and down, but as his platelet count remained 25,000 in June 2021, he was started on prednisone 20 mg 3 times daily, with a good response.  He has had leukocytosis secondary to the high-dose prednisone.  He also has had oral candidiasis on occasion. Malachi Carl titers were elevated, so this may be representative of chronic fatigue syndrome. We decreased his prednisone to 45 mg in late September and platelets remained stable at 97,000.  Since we were unable to taper him off the prednisone he was treated with rituximab weekly for 4 weeks in October and November 2021.  We have been slowly tapering the prednisone.  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  He has also had hypokalemia resolved with oral supplementation.    On May 18th, his platelet count was 151,000, so I decreased his prednisone to 5 mg daily. He was admitted with COVID-19 on May 24th after leaving work earlier due to altered mental status and generalized weakness. He had acute kidney injury with a creatinine of 2.2, which normalized with IV hydration. He had fever, but no respiratory symptoms. He was discharged on a prolonged prednisone taper over 15 weeks.  He had been having some chest pain and dyspnea since discontinuing indapamide, so was referred to Dr. Tomie China in Cardiology. He placed him on furosemide 40 mg daily and metoprolol 50 mg daily.  He had had problems with hypokalemia for a long time but this worsened with the diuretic.  He also had him increase his potassium to 40 mg twice daily.  He states his lower extremity edema has resolved with the furosemide.  The ECHO shows left ventricular hypertrophy with an EF between 55-60%, and the CT of the coronary arteries looks good.  He is currently on gabapentin 300 mg BID.  He was on a extremely slow taper of the prednisone, but his platelets dropped to 73,000 when the dose was dropped to 5 mg three times per week.  I increased it back to 5 mg daily and stopped the gabapentin.    INTERVAL HISTORY:  Mike [name] is here for routine follow up and states that he has been fairly well. He does note numbness of the feet if he remains stationary. He continues prednisone 5 mg daily. He has stopped taking oral vitamin D,  as he read that this could affect ITP. He has also started taking apple pectin for cholesterol. He continues to follow with Dr. Tomie China of cardiology for his left ventricular hypertrophy. He also continues to follow with the spine specialist, and is being treated with physical therapy. His platelets had gone from 97,000 to 83,000 2 weeks ago. Blood counts and chemistries are unremarkable  except for a platelet count of 90,000, improved from 83,000. His  appetite is good, and his weight is stable since his last visit.  He denies fever, chills or other signs of infection.  He denies nausea, vomiting, bowel issues, or abdominal pain.  He denies sore throat, cough, dyspnea, or chest pain.  REVIEW OF SYSTEMS:  Review of Systems  Constitutional: Negative.  Negative for appetite change, chills, fatigue, fever and unexpected weight change.  HENT:  Negative.    Eyes: Negative.   Respiratory: Negative.  Negative for chest tightness, cough, hemoptysis, shortness of breath and wheezing.   Cardiovascular: Negative.  Negative for chest pain, leg swelling and palpitations.  Gastrointestinal: Negative.  Negative for abdominal distention, abdominal pain, blood in stool, constipation, diarrhea, nausea and vomiting.  Endocrine: Negative.   Genitourinary: Negative.  Negative for difficulty urinating, dysuria, frequency and hematuria.   Musculoskeletal: Negative.  Negative for arthralgias, back pain, flank pain, gait problem and myalgias.  Skin: Negative.   Neurological:  Positive for numbness (of the feet when stationary). Negative for dizziness, extremity weakness, gait problem, headaches, light-headedness, seizures and speech difficulty.  Hematological: Negative.   Psychiatric/Behavioral: Negative.  Negative for depression and sleep disturbance. The patient is not nervous/anxious.   All other systems reviewed and are negative.  VITALS:  Blood pressure 132/64, pulse (!) 59, temperature 98.3 F (36.8 C), temperature source Oral, resp. rate 18, height 5\' 11"  (1.803 m), weight 250 lb 8 oz (113.6 kg), SpO2 100 %.  Wt Readings from Last 3 Encounters:  05/30/21 250 lb 8 oz (113.6 kg)  05/17/21 249 lb 9.6 oz (113.2 kg)  05/02/21 253 lb (114.8 kg)    Body mass index is 34.94 kg/m.  Performance status (ECOG): 1 - Symptomatic but completely ambulatory  PHYSICAL EXAM:  Physical  Exam Constitutional:      General: He is not in acute distress.    Appearance: Normal appearance. He is normal weight.  HENT:     Head: Normocephalic and atraumatic.  Eyes:     General: No scleral icterus.    Extraocular Movements: Extraocular movements intact.     Conjunctiva/sclera: Conjunctivae normal.     Pupils: Pupils are equal, round, and reactive to light.  Cardiovascular:     Rate and Rhythm: Regular rhythm. Bradycardia present.     Pulses: Normal pulses.     Heart sounds: Normal heart sounds. No murmur heard.   No friction rub. No gallop.  Pulmonary:     Effort: Pulmonary effort is normal. No respiratory distress.     Breath sounds: Normal breath sounds.  Abdominal:     General: Bowel sounds are normal. There is no distension.     Palpations: Abdomen is soft. There is no hepatomegaly, splenomegaly or mass.     Tenderness: There is no abdominal tenderness.  Musculoskeletal:        General: Normal range of motion.     Cervical back: Normal range of motion and neck supple.     Right lower leg: No edema.     Left lower leg: No edema.  Lymphadenopathy:  Cervical: No cervical adenopathy.  Skin:    General: Skin is warm and dry.  Neurological:     General: No focal deficit present.     Mental Status: He is alert and oriented to person, place, and time. Mental status is at baseline.  Psychiatric:        Mood and Affect: Mood normal.        Behavior: Behavior normal.        Thought Content: Thought content normal.        Judgment: Judgment normal.   LABS:   CBC Latest Ref Rng & Units 05/30/2021 05/16/2021 05/02/2021  WBC - 9.9 10.0 9.9  Hemoglobin 13.5 - 17.5 13.9 14.0 13.5  Hematocrit 41 - 53 41 42 40(A)  Platelets 150 - 399 90(A) 83(A) 97(A)   CMP Latest Ref Rng & Units 05/30/2021 05/16/2021 05/02/2021  Glucose 65 - 99 mg/dL - - -  BUN 4 - 21 9 14 15   Creatinine 0.6 - 1.3 0.9 1.0 0.9  Sodium 137 - 147 144 142 143  Potassium 3.4 - 5.3 4.2 4.1 3.4  Chloride 99 - 108  103 101 105  CO2 13 - 22 29(A) 28(A) 23(A)  Calcium 8.7 - 10.7 9.0 9.0 8.5(A)  Total Protein 6.5 - 8.1 g/dL - - -  Total Bilirubin 0.3 - 1.2 mg/dL - - -  Alkaline Phos 25 - 125 69 72 64  AST 14 - 40 21 22 22   ALT 10 - 40 15 15 15     STUDIES:  No results found.    HISTORY:   Allergies: No Known Allergies  Current Medications: Current Outpatient Medications  Medication Sig Dispense Refill   acetaminophen (TYLENOL) 500 MG tablet Take 500 mg by mouth every 4 (four) hours as needed for moderate pain.     albuterol (VENTOLIN HFA) 108 (90 Base) MCG/ACT inhaler Inhale 2 puffs into the lungs every 4 (four) hours as needed for wheezing or shortness of breath.     amLODipine (NORVASC) 10 MG tablet Take 10 mg by mouth daily.     Ascorbic Acid (VITAMIN C) 1000 MG tablet Take 1,000 mg by mouth daily.     benazepril (LOTENSIN) 20 MG tablet Take 1 tablet (20 mg total) by mouth daily. 90 tablet 3   CALCIUM PO Take 1 tablet by mouth daily.     escitalopram (LEXAPRO) 10 MG tablet Take 10 mg by mouth daily.     folic acid (FOLVITE) 1 MG tablet Take 1 tablet (1 mg total) by mouth daily. 90 tablet 3   glipiZIDE (GLUCOTROL XL) 10 MG 24 hr tablet Take 10 mg by mouth daily.     metFORMIN (GLUCOPHAGE-XR) 500 MG 24 hr tablet Take 1,000 mg by mouth 2 (two) times daily.     metoprolol succinate (TOPROL-XL) 100 MG 24 hr tablet Take 1 tablet (100 mg total) by mouth daily. Take with or immediately following a meal. 90 tablet 3   NON FORMULARY Apple pectin 700mg  daily     omeprazole (PRILOSEC) 20 MG capsule Take 20 mg by mouth daily.     oxyCODONE (OXY IR/ROXICODONE) 5 MG immediate release tablet Take 5 mg by mouth every 6 (six) hours as needed for pain.     potassium chloride SA (KLOR-CON) 20 MEQ tablet Take 2 tablets (40 mEq total) by mouth 3 (three) times daily. 180 tablet 5   predniSONE (DELTASONE) 5 MG tablet Take 1 tablet (5 mg total) by mouth 2 (two) times daily with  a meal. (Patient taking differently:  Take 5 mg by mouth daily with breakfast. Per Dr. Gilman Buttner, only taking it once a day) 100 tablet 1   rosuvastatin (CRESTOR) 10 MG tablet Take 1 tablet (10 mg total) by mouth daily. 90 tablet 3   sildenafil (VIAGRA) 100 MG tablet Take 100 mg by mouth daily as needed for erectile dysfunction.     vitamin B-12 (CYANOCOBALAMIN) 1000 MCG tablet Take 1,000 mcg by mouth daily.     vitamin k 100 MCG tablet Take 100 mcg by mouth daily.     furosemide (LASIX) 40 MG tablet Take 1 tablet (40 mg total) by mouth daily. 90 tablet 3   No current facility-administered medications for this visit.     ASSESSMENT & PLAN:   Assessment:  1. Significant thrombocytopenia, which has been fluctuating up and down since June 2020.  This is immune thrombocytopenic purpura.  We placed him on prednisone 20 mg three times daily in early June.  We were unable to taper his steroids, so he underwent rituximab therapy weekly for 4 weeks in October 2021 with a partial response.  He is not a good candidate for splenectomy unless absolutely necessary.  His platelet count has remained above 75,000, we have steadily tapered his prednisone and prior to his hospital admission in May 2022, he was on prednisone 5mg  daily. He was admitted for COVID and placed back on high dose prednisone, with an extremely slow taper.  His platelet count worsened in August 2022, so we increased the dose back to 5 mg daily.       2. Steroid induced diabetes, for which he continues metformin 500 mg twice daily and glipizide 5 mg daily.     3.  Elevated Epstein Barr titers.  This may be indicative of chronic fatigue syndrome, and I advised that he rest as needed.  We had a discussion about this diagnosis.  His sister has him taking zinc, lemon balm, and L lysine.   4.  Sleep apnea, untreated. I have encouraged him to use his CPAP mask for better quality of life and to reduce his daytime sleepiness.  He has been referred to  Pulmonary.   5.  Chronic anxiety,  mainly related to his health.  6.  Admission with acute kidney injury due to dehydration associated with COVID-19, May 2022.   He was placed on an extremely slow prednisone taper.    7.  Shortness of breath with exertion which persists despite a good cardiac evaluation, but he does have evidence of left ventricular hypertrophy.  He is also following with pulmonology.  8.  Significant left hip and lower back pain.  He is following with Dr. Noel Gerold, spine and scoliosis specialists.  He is undergoing physical therapy.  9.  Anemia, mild.  B12 was normal, but folate was low at 4.7.  10.  Hypertension, controlled.   Plan:     He will continue prednisone dose 5 mg daily.  He will continue to follow with Dr. Tomie China, Dr. Noel Gerold, and pulmonology.  They will be determining when he can return to work. He will return in 2 weeks for CBC.  I will plan to see him back in 4 weeks with a CBC and comprehensive metabolic panel. The patient and his sister understand the plans discussed today and are in agreement with them. They know to contact our office if he develops concerns prior to his next appointment.  I provided 15 minutes of face-to-face time during this this  encounter and > 50% was spent counseling as documented under my assessment and plan.    I, Foye Deer, am acting as scribe for Dellia Beckwith, MD  I have reviewed this report as typed by the medical scribe, and it is complete and accurate.  Gery Pray, MD [facility]

## 2021-05-30 ENCOUNTER — Other Ambulatory Visit: Payer: Self-pay | Admitting: Oncology

## 2021-05-30 ENCOUNTER — Inpatient Hospital Stay: Payer: 59

## 2021-05-30 ENCOUNTER — Other Ambulatory Visit: Payer: Self-pay

## 2021-05-30 ENCOUNTER — Other Ambulatory Visit: Payer: Self-pay | Admitting: Hematology and Oncology

## 2021-05-30 ENCOUNTER — Inpatient Hospital Stay (INDEPENDENT_AMBULATORY_CARE_PROVIDER_SITE_OTHER): Payer: 59 | Admitting: Oncology

## 2021-05-30 ENCOUNTER — Encounter: Payer: Self-pay | Admitting: Oncology

## 2021-05-30 ENCOUNTER — Telehealth: Payer: Self-pay | Admitting: Oncology

## 2021-05-30 VITALS — BP 132/64 | HR 59 | Temp 98.3°F | Resp 18 | Ht 71.0 in | Wt 250.5 lb

## 2021-05-30 DIAGNOSIS — D693 Immune thrombocytopenic purpura: Secondary | ICD-10-CM | POA: Diagnosis not present

## 2021-05-30 LAB — HEPATIC FUNCTION PANEL
ALT: 15 (ref 10–40)
AST: 21 (ref 14–40)
Alkaline Phosphatase: 69 (ref 25–125)
Bilirubin, Total: 1

## 2021-05-30 LAB — BASIC METABOLIC PANEL
BUN: 9 (ref 4–21)
CO2: 29 — AB (ref 13–22)
Chloride: 103 (ref 99–108)
Creatinine: 0.9 (ref 0.6–1.3)
Glucose: 99
Potassium: 4.2 (ref 3.4–5.3)
Sodium: 144 (ref 137–147)

## 2021-05-30 LAB — CBC AND DIFFERENTIAL
HCT: 41 (ref 41–53)
Hemoglobin: 13.9 (ref 13.5–17.5)
Neutrophils Absolute: 6.63
Platelets: 90 — AB (ref 150–399)
WBC: 9.9

## 2021-05-30 LAB — CBC
MCV: 84 (ref 76–111)
RBC: 4.83 (ref 3.87–5.11)

## 2021-05-30 LAB — COMPREHENSIVE METABOLIC PANEL
Albumin: 4.6 (ref 3.5–5.0)
Calcium: 9 (ref 8.7–10.7)

## 2021-05-30 NOTE — Telephone Encounter (Signed)
Per 9/29 LOS, patient scheduled for Oct Appt's.  Gave patient Appt Summary 

## 2021-06-03 ENCOUNTER — Encounter: Payer: Self-pay | Admitting: Oncology

## 2021-06-04 ENCOUNTER — Encounter: Payer: Self-pay | Admitting: Oncology

## 2021-06-05 ENCOUNTER — Encounter: Payer: Self-pay | Admitting: Oncology

## 2021-06-13 ENCOUNTER — Inpatient Hospital Stay: Payer: 59 | Attending: Oncology

## 2021-06-13 ENCOUNTER — Telehealth: Payer: Self-pay

## 2021-06-13 ENCOUNTER — Other Ambulatory Visit: Payer: Self-pay | Admitting: Hematology and Oncology

## 2021-06-13 DIAGNOSIS — D693 Immune thrombocytopenic purpura: Secondary | ICD-10-CM

## 2021-06-13 LAB — HEPATIC FUNCTION PANEL
ALT: 14 (ref 10–40)
AST: 22 (ref 14–40)
Alkaline Phosphatase: 77 (ref 25–125)
Bilirubin, Total: 1

## 2021-06-13 LAB — BASIC METABOLIC PANEL
BUN: 10 (ref 4–21)
CO2: 28 — AB (ref 13–22)
Chloride: 103 (ref 99–108)
Creatinine: 0.9 (ref 0.6–1.3)
Glucose: 101
Potassium: 4.6 (ref 3.4–5.3)
Sodium: 142 (ref 137–147)

## 2021-06-13 LAB — COMPREHENSIVE METABOLIC PANEL
Albumin: 4.9 (ref 3.5–5.0)
Calcium: 9.4 (ref 8.7–10.7)

## 2021-06-13 LAB — CBC
MCV: 84 (ref 80–94)
RBC: 5.2 — AB (ref 3.87–5.11)

## 2021-06-13 LAB — CBC AND DIFFERENTIAL
HCT: 44 (ref 41–53)
Hemoglobin: 14.4 (ref 13.5–17.5)
Neutrophils Absolute: 7
Platelets: 74 — AB (ref 150–399)
WBC: 10.6

## 2021-06-13 NOTE — Telephone Encounter (Signed)
I called  Pt today in reference to his plantlet count of 74. Pt answered the phone and identified himself x2 identifiers. Pt stated he understood his plantlet count of 74, and will call back if he has any issues. Pt agreed with all the above.

## 2021-06-24 NOTE — Progress Notes (Signed)
[facility]  [address] [place],  [place]  [address] [phone]  Clinic Day:  06/28/2021  Referring physician: Julianne Handler, NP  This document serves as a record of services personally performed by Gery Pray, MD. It was created on their behalf by Curry,Lauren E, a trained medical scribe. The creation of this record is based on the scribe's personal observations and the provider's statements to them.  CHIEF COMPLAINT:  CC:  Immune thrombocytopenic purpura  Current Treatment:   Prednisone 5 mg daily  HISTORY OF PRESENT ILLNESS:  Mike [name] is a 50 y.o. male with a history of severe thrombocytopenia, felt to represent immune thrombocytopenic purpura.  He has had thrombocytopenia, which has fluctuated up and down since June 2020.  Further laboratory evaluation did not reveal any other specific etiology for his thrombocytopenia.  He had mildly elevated liver transaminases.  Abdominal ultrasound revealed increased echogenicity of hepatic parenchyma suggesting hepatic steatosis, with probable fatty sparing adjacent to gallbladder fossa.  No other abnormality was seen.  We recommended bone marrow examination, but the patient and his mother declined.  The platelet count continued to fluctuate up and down, but as his platelet count remained 25,000 in June 2021, he was started on prednisone 20 mg 3 times daily, with a good response.  He has had leukocytosis secondary to the high-dose prednisone.  He also has had oral candidiasis on occasion. Malachi Carl titers were elevated, so this may be representative of chronic fatigue syndrome. We decreased his prednisone to 45 mg in late September and platelets remained stable at 97,000.  Since we were unable to taper him off the prednisone he was treated with rituximab weekly for 4 weeks in October and November 2021.  We have been slowly tapering the prednisone.  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  He has also had hypokalemia resolved with oral supplementation.    On May 18th, his platelet count was 151,000, so I decreased his prednisone to 5 mg daily. He was admitted with COVID-19 on May 24th after leaving work earlier due to altered mental status and generalized weakness. He had acute kidney injury with a creatinine of 2.2, which normalized with IV hydration. He had fever, but no respiratory symptoms. He was discharged on a prolonged prednisone taper over 15 weeks.  He had been having some chest pain and dyspnea since discontinuing indapamide, so was referred to Dr. Tomie China in Cardiology. He placed him on furosemide 40 mg daily and metoprolol 50 mg daily.  He had had problems with hypokalemia for a long time but this worsened with the diuretic.  He also had him increase his potassium to 40 mg twice daily.  He states his lower extremity edema has resolved with the furosemide.  The ECHO shows left ventricular hypertrophy with an EF between 55-60%, and the CT of the coronary arteries looks good.  He is currently on gabapentin 300 mg BID.  He was on a extremely slow taper of the prednisone, but his platelets dropped to 73,000 when the dose was dropped to 5 mg three times per week.  I increased it back to 5 mg daily and stopped the gabapentin as it wasn't helping.    INTERVAL HISTORY:  Mike [name] is here for routine follow up and states that he has been fairly well. He notes numbness of the bilateral hands and lower back pain. He continues prednisone 5 mg daily. MRI of the  lumbar spine from October 27th revealed degenerative changes of the lumbar spine most prominent L4-5. He continues physical therapy. Platelets have mildly improved from 74,000 to 87,000, and white count and hemoglobin are normal. Chemistries are unremarkable except for a bilirubin of 1.4. His  appetite is good, and he has lost 1 pound since his last visit.  He denies fever, chills or other  signs of infection.  He denies nausea, vomiting, bowel issues, or abdominal pain.  He denies sore throat, cough, dyspnea, or chest pain.  REVIEW OF SYSTEMS:  Review of Systems  Constitutional: Negative.  Negative for appetite change, chills, fatigue, fever and unexpected weight change.  HENT:  Negative.    Eyes: Negative.   Respiratory: Negative.  Negative for chest tightness, cough, hemoptysis, shortness of breath and wheezing.   Cardiovascular: Negative.  Negative for chest pain, leg swelling and palpitations.  Gastrointestinal: Negative.  Negative for abdominal distention, abdominal pain, blood in stool, constipation, diarrhea, nausea and vomiting.  Endocrine: Negative.   Genitourinary: Negative.  Negative for difficulty urinating, dysuria, frequency and hematuria.   Musculoskeletal:  Positive for back pain (lumbar spine). Negative for arthralgias, flank pain, gait problem and myalgias.  Skin: Negative.   Neurological:  Positive for numbness (of the bilateral hands). Negative for dizziness, extremity weakness, gait problem, headaches, light-headedness, seizures and speech difficulty.  Hematological: Negative.   Psychiatric/Behavioral: Negative.  Negative for depression and sleep disturbance. The patient is not nervous/anxious.   All other systems reviewed and are negative.  VITALS:  Blood pressure (!) 141/75, pulse 73, temperature 98.5 F (36.9 C), temperature source Oral, resp. rate 18, height 5\' 11"  (1.803 m), weight 249 lb 11.2 oz (113.3 kg), SpO2 99 %.  Wt Readings from Last 3 Encounters:  06/28/21 249 lb 11.2 oz (113.3 kg)  05/30/21 250 lb 8 oz (113.6 kg)  05/17/21 249 lb 9.6 oz (113.2 kg)    Body mass index is 34.83 kg/m.  Performance status (ECOG): 1 - Symptomatic but completely ambulatory  PHYSICAL EXAM:  Physical Exam Constitutional:      General: He is not in acute distress.    Appearance: Normal appearance. He is normal weight.  HENT:     Head: Normocephalic and  atraumatic.  Eyes:     General: No scleral icterus.    Extraocular Movements: Extraocular movements intact.     Conjunctiva/sclera: Conjunctivae normal.     Pupils: Pupils are equal, round, and reactive to light.  Cardiovascular:     Rate and Rhythm: Normal rate and regular rhythm.     Pulses: Normal pulses.     Heart sounds: Normal heart sounds. No murmur heard.   No friction rub. No gallop.  Pulmonary:     Effort: Pulmonary effort is normal. No respiratory distress.     Breath sounds: Normal breath sounds.  Abdominal:     General: Bowel sounds are normal. There is no distension.     Palpations: Abdomen is soft. There is no hepatomegaly, splenomegaly or mass.     Tenderness: There is no abdominal tenderness.  Musculoskeletal:        General: Normal range of motion.     Cervical back: Normal range of motion and neck supple.     Right lower leg: No edema.     Left lower leg: No edema.  Lymphadenopathy:     Cervical: No cervical adenopathy.  Skin:    General: Skin is warm and dry.  Neurological:     General: No focal deficit  present.     Mental Status: He is alert and oriented to person, place, and time. Mental status is at baseline.  Psychiatric:        Mood and Affect: Mood normal.        Behavior: Behavior normal.        Thought Content: Thought content normal.        Judgment: Judgment normal.   LABS:   CBC Latest Ref Rng & Units 06/28/2021 06/13/2021 05/30/2021  WBC - 9.6 10.6 9.9  Hemoglobin 13.5 - 17.5 14.1 14.4 13.9  Hematocrit 41 - 53 42 44 41  Platelets 150 - 399 87(A) 74(A) 90(A)   CMP Latest Ref Rng & Units 06/28/2021 06/13/2021 05/30/2021  Glucose 65 - 99 mg/dL - - -  BUN 4 - 21 11 10 9   Creatinine 0.6 - 1.3 0.9 0.9 0.9  Sodium 137 - 147 142 142 144  Potassium 3.4 - 5.3 4.3 4.6 4.2  Chloride 99 - 108 105 103 103  CO2 13 - 22 27(A) 28(A) 29(A)  Calcium 8.7 - 10.7 9.2 9.4 9.0  Total Protein 6.5 - 8.1 g/dL - - -  Total Bilirubin 0.3 - 1.2 mg/dL - - -   Alkaline Phos 25 - 125 74 77 69  AST 14 - 40 19 22 21   ALT 10 - 40 16 14 15     STUDIES:  No results found.   MRI OF LUMBAR SPINE WITHOUT INTRAVENOUS CONTRAST: 06/27/2021    CLINICAL HISTORY:  Pain extending down the left leg. Other spondylosis, lumbar region  Low back pain, unspecified   TECHNIQUE: Sagittal and axial unenhanced T1, axial T2, sagittal STIR/fat-saturated T2 images obtained through the lumbar spine. In addition, coronal fat-saturated T2-weighted images obtained through the lumbar spine.   COMPARISON:None.   FINDINGS:  T11-L3: Disc spaces negative.  Mild bilateral facet joint hypertrophy.. Negative for neural foraminal narrowing , lateral recess narrowing or central spinal canal narrowing.   L3-4: Disc desiccation.  Mild loss of disc height.  Mild diffuse disc bulge.  Mild to moderate bilateral facet joint hypertrophy.  Slight bilateral neural foraminal narrowing.  Slight bilateral lateral recess narrowing but negative for central spinal canal narrowing.   L4-5: Disc desiccation.  Mild loss of disc height.  Moderate central zone disc protrusion.  Additionally a mild to moderate bilateral facet joint hypertrophy and ligamentum flavum hypertrophy.  Mild bilateral neural foraminal narrowing.  Moderate right but no left lateral recess narrowing.  Mild central spinal canal narrowing.   L5-S1: Disc space negative.  Mild bilateral facet joint hypertrophy. Negative for neural foraminal narrowing , lateral recess narrowing or central spinal canal narrowing.    Distal spinal cord and spinal canal negative. Imaged kidneys and aorta negative.  Bone marrow signal negative.  The remainder of the exam negative.   IMPRESSION  Degenerative changes of the lumbar spine most prominent L4-5.   HISTORY:   Allergies: No Known Allergies  Current Medications: Current Outpatient Medications  Medication Sig Dispense Refill   acetaminophen (TYLENOL) 500 MG tablet Take 500 mg by mouth every 4  (four) hours as needed for moderate pain.     albuterol (VENTOLIN HFA) 108 (90 Base) MCG/ACT inhaler Inhale 2 puffs into the lungs every 4 (four) hours as needed for wheezing or shortness of breath.     amLODipine (NORVASC) 10 MG tablet Take 10 mg by mouth daily.     Ascorbic Acid (VITAMIN C) 1000 MG tablet Take 1,000 mg by mouth daily.  benazepril (LOTENSIN) 20 MG tablet Take 1 tablet (20 mg total) by mouth daily. 90 tablet 3   CALCIUM PO Take 1 tablet by mouth daily.     escitalopram (LEXAPRO) 10 MG tablet Take 10 mg by mouth daily.     folic acid (FOLVITE) 1 MG tablet Take 1 tablet (1 mg total) by mouth daily. 90 tablet 3   furosemide (LASIX) 40 MG tablet Take 1 tablet (40 mg total) by mouth daily. 90 tablet 3   glipiZIDE (GLUCOTROL XL) 10 MG 24 hr tablet Take 10 mg by mouth daily.     metFORMIN (GLUCOPHAGE-XR) 500 MG 24 hr tablet Take 1,000 mg by mouth 2 (two) times daily.     metoprolol succinate (TOPROL-XL) 100 MG 24 hr tablet Take 1 tablet (100 mg total) by mouth daily. Take with or immediately following a meal. 90 tablet 3   NON FORMULARY Apple pectin 700mg  daily     omeprazole (PRILOSEC) 20 MG capsule Take 20 mg by mouth daily.     oxyCODONE (OXY IR/ROXICODONE) 5 MG immediate release tablet Take 5 mg by mouth every 6 (six) hours as needed for pain.     potassium chloride SA (KLOR-CON) 20 MEQ tablet Take 2 tablets (40 mEq total) by mouth 3 (three) times daily. 180 tablet 5   predniSONE (DELTASONE) 5 MG tablet Take 1 tablet (5 mg total) by mouth 2 (two) times daily with a meal. (Patient taking differently: Take 5 mg by mouth daily with breakfast. Per Dr. , only taking it once a day) 100 tablet 1   vitamin B-12 (CYANOCOBALAMIN) 1000 MCG tablet Take 1,000 mcg by mouth daily.     vitamin k 100 MCG tablet Take 100 mcg by mouth daily.     No current facility-administered medications for this visit.     ASSESSMENT & PLAN:   Assessment:  1. Significant thrombocytopenia, which has  been fluctuating up and down since June 2020.  This is immune thrombocytopenic purpura.  We placed him on prednisone 20 mg three times daily in early June.  We were unable to taper his steroids, so he underwent rituximab therapy weekly for 4 weeks in October 2021 with a partial response.  He is not a good candidate for splenectomy unless absolutely necessary.  His platelet count has remained above 75,000, we have steadily tapered his prednisone and prior to his hospital admission in May 2022, he was on prednisone 5mg  daily. He was admitted for COVID and placed back on high dose prednisone, with an extremely slow taper.  His platelet count worsened in August 2022, so we increased the dose back to 5 mg daily.       2. Steroid induced diabetes, for which he continues metformin 500 mg twice daily and glipizide 5 mg daily.     3.  Elevated Epstein Barr titers.  This may be indicative of chronic fatigue syndrome, and I advised that he rest as needed.  We had a discussion about this diagnosis.  His sister has him taking zinc, lemon balm, and L lysine.   4.  Sleep apnea, untreated. I have encouraged him to use his CPAP mask for better quality of life and to reduce his daytime sleepiness.  He has been referred to  Pulmonary.   5.  Chronic anxiety, mainly related to his health.  6.  Admission with acute kidney injury due to dehydration associated with COVID-19, May 2022.   He was placed on an extremely slow prednisone taper.  7.  Significant left hip and lower back pain.  He is following with Dr. Noel Gerold, spine and scoliosis specialists.  He is undergoing physical therapy. MRI of the lumbar spine revealed degenerative changes of L4-5.  8. Hypertension, controlled.   Plan:     He will continue prednisone dose 5 mg daily.  He will continue to follow with Dr. Tomie China and Dr. Noel Gerold.  They will be determining when he can return to work. He will return in 2 weeks for CBC.  I will plan to see him back in 5 weeks  with a CBC and comprehensive metabolic panel. The patient understands the plans discussed today and are in agreement with them. They know to contact our office if he develops concerns prior to his next appointment.  I provided 15 minutes of face-to-face time during this this encounter and > 50% was spent counseling as documented under my assessment and plan.    I, Foye Deer, am acting as scribe for Dellia Beckwith, MD  I have reviewed this report as typed by the medical scribe, and it is complete and accurate.  Gery Pray, MD [facility]

## 2021-06-28 ENCOUNTER — Other Ambulatory Visit: Payer: Self-pay | Admitting: Hematology and Oncology

## 2021-06-28 ENCOUNTER — Other Ambulatory Visit: Payer: Self-pay | Admitting: Oncology

## 2021-06-28 ENCOUNTER — Telehealth: Payer: Self-pay | Admitting: *Deleted

## 2021-06-28 ENCOUNTER — Inpatient Hospital Stay (INDEPENDENT_AMBULATORY_CARE_PROVIDER_SITE_OTHER): Payer: 59 | Admitting: Oncology

## 2021-06-28 ENCOUNTER — Inpatient Hospital Stay: Payer: 59

## 2021-06-28 ENCOUNTER — Encounter: Payer: Self-pay | Admitting: Oncology

## 2021-06-28 VITALS — BP 141/75 | HR 73 | Temp 98.5°F | Resp 18 | Ht 71.0 in | Wt 249.7 lb

## 2021-06-28 DIAGNOSIS — D693 Immune thrombocytopenic purpura: Secondary | ICD-10-CM

## 2021-06-28 LAB — BASIC METABOLIC PANEL
BUN: 11 (ref 4–21)
CO2: 27 — AB (ref 13–22)
Chloride: 105 (ref 99–108)
Creatinine: 0.9 (ref 0.6–1.3)
Glucose: 114
Potassium: 4.3 (ref 3.4–5.3)
Sodium: 142 (ref 137–147)

## 2021-06-28 LAB — HEPATIC FUNCTION PANEL
ALT: 16 (ref 10–40)
AST: 19 (ref 14–40)
Alkaline Phosphatase: 74 (ref 25–125)
Bilirubin, Total: 1.4

## 2021-06-28 LAB — CBC AND DIFFERENTIAL
HCT: 42 (ref 41–53)
Hemoglobin: 14.1 (ref 13.5–17.5)
Neutrophils Absolute: 6.14
Platelets: 87 — AB (ref 150–399)
WBC: 9.6

## 2021-06-28 LAB — CBC: RBC: 4.98 (ref 3.87–5.11)

## 2021-06-28 LAB — COMPREHENSIVE METABOLIC PANEL
Albumin: 4.5 (ref 3.5–5.0)
Calcium: 9.2 (ref 8.7–10.7)

## 2021-06-28 NOTE — Telephone Encounter (Signed)
Per 10/28 los next appt scheduled and given to patient 

## 2021-07-12 ENCOUNTER — Encounter: Payer: Self-pay | Admitting: Hematology and Oncology

## 2021-07-12 ENCOUNTER — Inpatient Hospital Stay: Payer: 59 | Attending: Oncology

## 2021-07-12 ENCOUNTER — Telehealth: Payer: Self-pay

## 2021-07-12 DIAGNOSIS — D693 Immune thrombocytopenic purpura: Secondary | ICD-10-CM

## 2021-07-12 LAB — CBC AND DIFFERENTIAL
HCT: 41 (ref 41–53)
Hemoglobin: 14.3 (ref 13.5–17.5)
Neutrophils Absolute: 6.21
Platelets: 82 — AB (ref 150–399)
WBC: 9.7

## 2021-07-12 LAB — HEPATIC FUNCTION PANEL
ALT: 18 (ref 10–40)
Bilirubin, Total: 2

## 2021-07-12 LAB — BASIC METABOLIC PANEL
BUN: 11 (ref 4–21)
CO2: 22 (ref 13–22)
Chloride: 107 (ref 99–108)
Creatinine: 0.8 (ref 0.6–1.3)
Glucose: 97
Sodium: 141 (ref 137–147)

## 2021-07-12 LAB — COMPREHENSIVE METABOLIC PANEL
Albumin: 4.4 (ref 3.5–5.0)
Calcium: 9 (ref 8.7–10.7)

## 2021-07-12 LAB — CBC: RBC: 4.86 (ref 3.87–5.11)

## 2021-07-12 NOTE — Telephone Encounter (Signed)
I called Patient at the home number listed on chart. No answer however at lab apt today, patient did give me permission to leave a detailed message on VM. I stated that the patients plat. Count was 82 this visit, if the patient has any questions please call back. I gave patient my direct ext. Call complete.

## 2021-07-24 NOTE — Progress Notes (Signed)
[name]  [address] [place],  [name]  [id number] [phone]  Clinic Day:  08/02/2021  Referring physician: [name], NP  This document serves as a record of services personally performed by [name], MD. It was created on their behalf by Curry,Lauren E, a trained medical scribe. The creation of this record is based on the scribe's personal observations and the provider's statements to them.  CHIEF COMPLAINT:  CC:  Immune thrombocytopenic purpura  Current Treatment:   Prednisone 5 mg daily  HISTORY OF PRESENT ILLNESS:  Mike [name] is a 50 y.o. male with a history of severe thrombocytopenia, felt to represent immune thrombocytopenic purpura.  He has had thrombocytopenia, which has fluctuated up and down since June 2020.  Further laboratory evaluation did not reveal any other specific etiology for his thrombocytopenia.  He had mildly elevated liver transaminases.  Abdominal ultrasound revealed increased echogenicity of hepatic parenchyma suggesting hepatic steatosis, with probable fatty sparing adjacent to gallbladder fossa.  No other abnormality was seen.  We recommended bone marrow examination, but the patient and his mother declined.  The platelet count continued to fluctuate up and down, but as his platelet count remained 25,000 in June 2021, he was started on prednisone 20 mg 3 times daily, with a good response.  He has had leukocytosis secondary to the high-dose prednisone.  He also has had oral candidiasis on occasion. [name] titers were elevated, so this may be representative of chronic fatigue syndrome. We decreased his prednisone to 45 mg in late September and platelets remained stable at 97,000.  Since we were unable to taper him off the prednisone he was treated with rituximab weekly for 4 weeks in October and November 2021.  We have been slowly tapering the prednisone.  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  He has also had hypokalemia resolved with oral supplementation.    On May 18th, his platelet count was 151,000, so I decreased his prednisone to 5 mg daily. He was admitted with COVID-19 on May 24th after leaving work earlier due to altered mental status and generalized weakness. He had acute kidney injury with a creatinine of 2.2, which normalized with IV hydration. He had fever, but no respiratory symptoms. He was discharged on a prolonged prednisone taper over 15 weeks.  He had been having some chest pain and dyspnea since discontinuing indapamide, so was referred to Dr. Geraldo Pitter in Cardiology. He placed him on furosemide 40 mg daily and metoprolol 50 mg daily.  He had had problems with hypokalemia for a long time but this worsened with the diuretic.  He also had him increase his potassium to 40 mg twice daily.  He states his lower extremity edema has resolved with the furosemide.  The ECHO shows left ventricular hypertrophy with an EF between 55-60%, and the CT of the coronary arteries looks good.  He is currently on gabapentin 300 mg BID.  He was on a extremely slow taper of the prednisone, but his platelets dropped to 73,000 when the dose was dropped to 5 mg three times per week.  I increased it back to 5 mg daily and stopped the gabapentin as it wasn't helping.  MRI of the lumbar spine from October 27th revealed degenerative changes of the lumbar spine most prominent L4-5.  INTERVAL HISTORY:  Mike [name] is here for routine follow up and states that he is doing well other than continued back  pain and paresthesias of the lower extremities. He continues to follow with the spine and scoliosis center in [place], and they are planning for an injection. With his current platelet level, this should be safe to proceed. Platelets have mildly decreased from 82,000 to 76,000, and white count and hemoglobin are normal. Chemistries are unremarkable except for a bilirubin  of 2.1. We will add a UGT1A1 to his labs today to evaluate for Gilbert's syndrome. His  appetite is good, and he has lost 6 pounds since his last visit.  He denies fever, chills or other signs of infection.  He denies nausea, vomiting, bowel issues, or abdominal pain.  He denies sore throat, cough, dyspnea, or chest pain.  REVIEW OF SYSTEMS:  Review of Systems  Constitutional: Negative.  Negative for appetite change, chills, fatigue, fever and unexpected weight change.  HENT:  Negative.    Eyes: Negative.   Respiratory: Negative.  Negative for chest tightness, cough, hemoptysis, shortness of breath and wheezing.   Cardiovascular: Negative.  Negative for chest pain, leg swelling and palpitations.  Gastrointestinal: Negative.  Negative for abdominal distention, abdominal pain, blood in stool, constipation, diarrhea, nausea and vomiting.  Endocrine: Negative.   Genitourinary: Negative.  Negative for difficulty urinating, dysuria, frequency and hematuria.   Musculoskeletal: Negative.  Negative for arthralgias, back pain, flank pain, gait problem and myalgias.  Skin: Negative.   Neurological:  Positive for numbness (of the legs and feet). Negative for dizziness, extremity weakness, gait problem, headaches, light-headedness, seizures and speech difficulty.  Hematological: Negative.   Psychiatric/Behavioral:  Negative for depression and sleep disturbance. The patient is nervous/anxious.   All other systems reviewed and are negative.  VITALS:  Blood pressure 128/72, pulse 61, temperature 98.1 F (36.7 C), temperature source Oral, height 5\' 11"  (1.803 m), weight 243 lb 12.8 oz (110.6 kg), SpO2 100 %.  Wt Readings from Last 3 Encounters:  08/02/21 243 lb 12.8 oz (110.6 kg)  06/28/21 249 lb 11.2 oz (113.3 kg)  05/30/21 250 lb 8 oz (113.6 kg)    Body mass index is 34 kg/m.  Performance status (ECOG): 1 - Symptomatic but completely ambulatory  PHYSICAL EXAM:  Physical Exam Constitutional:       General: He is not in acute distress.    Appearance: Normal appearance. He is normal weight.  HENT:     Head: Normocephalic and atraumatic.  Eyes:     General: No scleral icterus.    Extraocular Movements: Extraocular movements intact.     Conjunctiva/sclera: Conjunctivae normal.     Pupils: Pupils are equal, round, and reactive to light.  Cardiovascular:     Rate and Rhythm: Normal rate and regular rhythm.     Pulses: Normal pulses.     Heart sounds: Normal heart sounds. No murmur heard.   No friction rub. No gallop.  Pulmonary:     Effort: Pulmonary effort is normal. No respiratory distress.     Breath sounds: Normal breath sounds.  Abdominal:     General: Bowel sounds are normal. There is no distension.     Palpations: Abdomen is soft. There is no hepatomegaly, splenomegaly or mass.     Tenderness: There is no abdominal tenderness.  Musculoskeletal:        General: Normal range of motion.     Cervical back: Normal range of motion and neck supple.     Right lower leg: No edema.     Left lower leg: No edema.  Lymphadenopathy:     Cervical:  No cervical adenopathy.  Skin:    General: Skin is warm and dry.  Neurological:     General: No focal deficit present.     Mental Status: He is alert and oriented to person, place, and time. Mental status is at baseline.  Psychiatric:        Mood and Affect: Mood normal.        Behavior: Behavior normal.        Thought Content: Thought content normal.        Judgment: Judgment normal.   LABS:   CBC Latest Ref Rng & Units 08/02/2021 07/12/2021 06/28/2021  WBC - 8.5 9.7 9.6  Hemoglobin 13.5 - 17.5 15.1 14.3 14.1  Hematocrit 41 - 53 44 41 42  Platelets 150 - 399 76(A) 82(A) 87(A)   CMP Latest Ref Rng & Units 08/02/2021 07/12/2021 06/28/2021  Glucose 65 - 99 mg/dL - - -  BUN 4 - 21 13 11 11   Creatinine 0.6 - 1.3 0.9 0.8 0.9  Sodium 137 - 147 142 141 142  Potassium 3.4 - 5.3 4.2 - 4.3  Chloride 99 - 108 104 107 105  CO2 13 - 22  104(A) 22 27(A)  Calcium 8.7 - 10.7 9.5 9.0 9.2  Total Protein 6.5 - 8.1 g/dL - - -  Total Bilirubin 0.3 - 1.2 mg/dL - - -  Alkaline Phos 25 - 125 72 - 74  AST 14 - 40 25 - 19  ALT 10 - 40 21 18 16     STUDIES:  No results found.    HISTORY:   Allergies: No Known Allergies  Current Medications: Current Outpatient Medications  Medication Sig Dispense Refill   acetaminophen (TYLENOL) 500 MG tablet Take 500 mg by mouth every 4 (four) hours as needed for moderate pain.     albuterol (VENTOLIN HFA) 108 (90 Base) MCG/ACT inhaler Inhale 2 puffs into the lungs every 4 (four) hours as needed for wheezing or shortness of breath.     amLODipine (NORVASC) 10 MG tablet Take 10 mg by mouth daily.     Ascorbic Acid (VITAMIN C) 1000 MG tablet Take 1,000 mg by mouth daily.     benazepril (LOTENSIN) 20 MG tablet Take 1 tablet (20 mg total) by mouth daily. 90 tablet 3   CALCIUM PO Take 1 tablet by mouth daily.     escitalopram (LEXAPRO) 10 MG tablet Take 10 mg by mouth daily.     folic acid (FOLVITE) 1 MG tablet Take 1 tablet (1 mg total) by mouth daily. 90 tablet 3   furosemide (LASIX) 40 MG tablet Take 1 tablet (40 mg total) by mouth daily. 90 tablet 3   glipiZIDE (GLUCOTROL XL) 10 MG 24 hr tablet Take 10 mg by mouth daily.     metFORMIN (GLUCOPHAGE-XR) 500 MG 24 hr tablet Take 1,000 mg by mouth 2 (two) times daily.     metoprolol succinate (TOPROL-XL) 100 MG 24 hr tablet Take 1 tablet (100 mg total) by mouth daily. Take with or immediately following a meal. 90 tablet 3   NON FORMULARY Apple pectin 700mg  daily     omeprazole (PRILOSEC) 20 MG capsule Take 20 mg by mouth daily.     oxyCODONE (OXY IR/ROXICODONE) 5 MG immediate release tablet Take 5 mg by mouth every 6 (six) hours as needed for pain.     potassium chloride SA (KLOR-CON) 20 MEQ tablet Take 2 tablets (40 mEq total) by mouth 3 (three) times daily. 180 tablet 5  predniSONE (DELTASONE) 5 MG tablet Take 1 tablet (5 mg total) by mouth 2  (two) times daily with a meal. (Patient taking differently: Take 5 mg by mouth daily with breakfast. Per Dr. Hinton Rao, only taking it once a day) 100 tablet 1   vitamin B-12 (CYANOCOBALAMIN) 1000 MCG tablet Take 1,000 mcg by mouth daily.     vitamin k 100 MCG tablet Take 100 mcg by mouth daily.     No current facility-administered medications for this visit.     ASSESSMENT & PLAN:   Assessment:  1. Significant thrombocytopenia, which has been fluctuating up and down since June 2020.  This is chronic immune thrombocytopenic purpura.  We placed him on prednisone 20 mg three times daily in early June 2020.  We were unable to taper his steroids, so he underwent rituximab therapy weekly for 4 weeks in October 2021 with a partial response.  He is not a good candidate for splenectomy unless absolutely necessary.  His platelet count has remained above 75,000, we have steadily tapered his prednisone and prior to his hospital admission in May 2022, he was on prednisone 5mg  daily. He was admitted for COVID and placed back on high dose prednisone, with an extremely slow taper.  His platelet count worsened in August 2022, so we increased the dose back to 5 mg daily, but he has not normalized.       2. Steroid induced diabetes, for which he continues metformin 500 mg twice daily and glipizide 5 mg daily.     3.  Elevated Epstein Barr titers.  This may be indicative of chronic fatigue syndrome, and I advised that he rest as needed.  We had a discussion about this diagnosis.  His sister has him taking zinc, lemon balm, and L lysine.   4.  Sleep apnea, untreated. I have encouraged him to use his CPAP mask for better quality of life and to reduce his daytime sleepiness.  He has been referred to  Pulmonary.   5.  Chronic anxiety, mainly related to his health.  6.  Admission with acute kidney injury due to dehydration associated with COVID-19, May 2022.   He was placed on an extremely slow prednisone taper.    7.   Significant left hip and lower back pain.  He is following with a spine specialist.  He is undergoing physical therapy. MRI of the lumbar spine revealed degenerative changes of L4-5.  8.  Elevated bilirubin which has persisted. I will check for UGT1A1 today.   Plan:     He will continue prednisone dose 5 mg daily.  He will continue to follow with Dr. Geraldo Pitter and the spine specialist.  They will be determining when he can return to work. He has exhausted his FMLA and we will provide the appropriate paperwork as his appointments and labs are an absolute medical necessity. He is scheduled for spinal injection on December 15th, so we will have him return on December 13th with CBC. Otherwise, I will plan to see him back in 4 weeks with a CBC and comprehensive metabolic panel for repeat clinical assessment. The patient understands the plans discussed today and are in agreement with them. They know to contact our office if he develops concerns prior to his next appointment.  I provided 15 minutes of face-to-face time during this this encounter and > 50% was spent counseling as documented under my assessment and plan.    I, Rita Ohara, am acting as scribe for Derwood Kaplan,  MD  I have reviewed this report as typed by the medical scribe, and it is complete and accurate.  [name], MD [place] [facility]

## 2021-08-02 ENCOUNTER — Other Ambulatory Visit: Payer: Self-pay | Admitting: Hematology and Oncology

## 2021-08-02 ENCOUNTER — Encounter: Payer: Self-pay | Admitting: Oncology

## 2021-08-02 ENCOUNTER — Inpatient Hospital Stay: Payer: 59

## 2021-08-02 ENCOUNTER — Other Ambulatory Visit: Payer: Self-pay | Admitting: Oncology

## 2021-08-02 ENCOUNTER — Telehealth: Payer: Self-pay | Admitting: Oncology

## 2021-08-02 ENCOUNTER — Inpatient Hospital Stay: Payer: 59 | Attending: Oncology | Admitting: Oncology

## 2021-08-02 VITALS — BP 128/72 | HR 61 | Temp 98.1°F | Ht 71.0 in | Wt 243.8 lb

## 2021-08-02 DIAGNOSIS — D693 Immune thrombocytopenic purpura: Secondary | ICD-10-CM

## 2021-08-02 LAB — HEPATIC FUNCTION PANEL
ALT: 21 (ref 10–40)
AST: 25 (ref 14–40)
Alkaline Phosphatase: 72 (ref 25–125)
Bilirubin, Total: 2.1

## 2021-08-02 LAB — BASIC METABOLIC PANEL
BUN: 13 (ref 4–21)
CO2: 104 — AB (ref 13–22)
Chloride: 104 (ref 99–108)
Creatinine: 0.9 (ref 0.6–1.3)
Glucose: 94
Potassium: 4.2 (ref 3.4–5.3)
Sodium: 142 (ref 137–147)

## 2021-08-02 LAB — CBC AND DIFFERENTIAL
HCT: 44 (ref 41–53)
Hemoglobin: 15.1 (ref 13.5–17.5)
Neutrophils Absolute: 4.68
Platelets: 76 — AB (ref 150–399)
WBC: 8.5

## 2021-08-02 LAB — CBC: RBC: 5.12 — AB (ref 3.87–5.11)

## 2021-08-02 LAB — COMPREHENSIVE METABOLIC PANEL
Albumin: 4.8 (ref 3.5–5.0)
Calcium: 9.5 (ref 8.7–10.7)

## 2021-08-02 NOTE — Telephone Encounter (Signed)
Per 12/2 los next appt scheduled and given to patient 

## 2021-08-13 ENCOUNTER — Other Ambulatory Visit: Payer: Self-pay

## 2021-08-13 ENCOUNTER — Other Ambulatory Visit: Payer: Self-pay | Admitting: Hematology and Oncology

## 2021-08-13 ENCOUNTER — Inpatient Hospital Stay: Payer: 59

## 2021-08-13 DIAGNOSIS — D693 Immune thrombocytopenic purpura: Secondary | ICD-10-CM

## 2021-08-13 LAB — BASIC METABOLIC PANEL
BUN: 13 (ref 4–21)
CO2: 30 — AB (ref 13–22)
Chloride: 104 (ref 99–108)
Creatinine: 0.9 (ref 0.6–1.3)
Glucose: 108
Potassium: 4.3 (ref 3.4–5.3)
Sodium: 143 (ref 137–147)

## 2021-08-13 LAB — CBC
MCV: 85 (ref 80–94)
RBC: 5.21 — AB (ref 3.87–5.11)

## 2021-08-13 LAB — HEPATIC FUNCTION PANEL
ALT: 20 (ref 10–40)
AST: 22 (ref 14–40)
Alkaline Phosphatase: 80 (ref 25–125)
Bilirubin, Total: 1.6

## 2021-08-13 LAB — COMPREHENSIVE METABOLIC PANEL
Albumin: 4.8 (ref 3.5–5.0)
Calcium: 9.5 (ref 8.7–10.7)

## 2021-08-13 LAB — CBC AND DIFFERENTIAL
HCT: 45 (ref 41–53)
Hemoglobin: 15.2 (ref 13.5–17.5)
Neutrophils Absolute: 8.25
Platelets: 77 — AB (ref 150–399)
WBC: 13.1

## 2021-08-22 NOTE — Progress Notes (Signed)
[facility]  [address] [place],  [place]  [address] [phone]  Clinic Day:  08/30/2021  Referring physician: Julianne Handler, NP  This document serves as a record of services personally performed by Gery Pray, MD. It was created on their behalf by Curry,Lauren E, a trained medical scribe. The creation of this record is based on the scribe's personal observations and the provider's statements to them.  CHIEF COMPLAINT:  CC:  Immune thrombocytopenic purpura  Current Treatment:   Prednisone 5 mg daily  HISTORY OF PRESENT ILLNESS:  Mike [name] is a 50 y.o. male with a history of severe thrombocytopenia, felt to represent immune thrombocytopenic purpura.  He has had thrombocytopenia, which has fluctuated up and down since June 2020.  Further laboratory evaluation did not reveal any other specific etiology for his thrombocytopenia.  He had mildly elevated liver transaminases.  Abdominal ultrasound revealed increased echogenicity of hepatic parenchyma suggesting hepatic steatosis, with probable fatty sparing adjacent to gallbladder fossa.  No other abnormality was seen.  We recommended bone marrow examination, but the patient and his mother declined.  The platelet count continued to fluctuate up and down, but as his platelet count remained 25,000 in June 2021, he was started on prednisone 20 mg 3 times daily, with a good response.  He has had leukocytosis secondary to the high-dose prednisone.  He also has had oral candidiasis on occasion. Mike [name] titers were elevated, so this may be representative of chronic fatigue syndrome. We decreased his prednisone to 45 mg in late September and platelets remained stable at 97,000.  Since we were unable to taper him off the prednisone he was treated with rituximab weekly for 4 weeks in October and November 2021.  We have been slowly tapering the prednisone.  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  He has also had hypokalemia resolved with oral supplementation.    On May 18th, his platelet count was 151,000, so I decreased his prednisone to 5 mg daily. He was admitted with COVID-19 on May 24th after leaving work earlier due to altered mental status and generalized weakness. He had acute kidney injury with a creatinine of 2.2, which normalized with IV hydration. He had fever, but no respiratory symptoms. He was discharged on a prolonged prednisone taper over 15 weeks.  He had been having some chest pain and dyspnea since discontinuing indapamide, so was referred to Dr. Tomie China in Cardiology. He placed him on furosemide 40 mg daily and metoprolol 50 mg daily.  He had had problems with hypokalemia for a long time but this worsened with the diuretic.  He also had him increase his potassium to 40 mg twice daily.  He states his lower extremity edema has resolved with the furosemide.  The ECHO shows left ventricular hypertrophy with an EF between 55-60%, and the CT of the coronary arteries looks good.  He is currently on gabapentin 300 mg BID.  He was on a extremely slow taper of the prednisone, but his platelets dropped to 73,000 when the dose was dropped to 5 mg three times per week.  I increased it back to 5 mg daily and stopped the gabapentin as it wasn't helping.  MRI of the lumbar spine from October 27th revealed degenerative changes of the lumbar spine most prominent L4-5.  INTERVAL HISTORY:  Mike [name] is here for routine follow up and states that he has been doing fairly well other than  stress. He is trying to figure out his insurance for the upcoming year. He continues prednisone 5 mg daily. He continues to follow with the spine specialist for his chronic back pain. He is hesitant to proceed with an epidural injection in view of his chronic thrombocytopenia. He continues to have neuropathy of the bilateral legs and feet when he stands for long periods and  experiences numbness of the legs. Platelets have improved from 77,000 to 90,000, white count is 11.5, and hemoglobin is normal. Chemistries are unremarkable except for a stable bilirubin of 1.6. His  appetite is good, and he has lost 3 pounds since his last visit.  He denies fever, chills or other signs of infection.  He denies nausea, vomiting, bowel issues, or abdominal pain.  He denies sore throat, cough, dyspnea, or chest pain.  REVIEW OF SYSTEMS:  Review of Systems  Constitutional: Negative.  Negative for appetite change, chills, fatigue, fever and unexpected weight change.  HENT:  Negative.    Eyes: Negative.   Respiratory: Negative.  Negative for chest tightness, cough, hemoptysis, shortness of breath and wheezing.   Cardiovascular: Negative.  Negative for chest pain, leg swelling and palpitations.  Gastrointestinal: Negative.  Negative for abdominal distention, abdominal pain, blood in stool, constipation, diarrhea, nausea and vomiting.  Endocrine: Negative.   Genitourinary: Negative.  Negative for difficulty urinating, dysuria, frequency and hematuria.   Musculoskeletal:  Positive for back pain (chronic). Negative for arthralgias, flank pain, gait problem and myalgias.  Skin: Negative.   Neurological:  Positive for numbness (of the legs and feet). Negative for dizziness, extremity weakness, gait problem, headaches, light-headedness, seizures and speech difficulty.  Hematological: Negative.   Psychiatric/Behavioral:  Negative for depression and sleep disturbance. The patient is nervous/anxious.   All other systems reviewed and are negative.  VITALS:  Blood pressure 126/76, pulse 71, temperature 97.6 F (36.4 C), temperature source Oral, resp. rate 16, height 5\' 11"  (1.803 m), weight 240 lb 12.8 oz (109.2 kg), SpO2 98 %.  Wt Readings from Last 3 Encounters:  08/30/21 240 lb 12.8 oz (109.2 kg)  08/02/21 243 lb 12.8 oz (110.6 kg)  06/28/21 249 lb 11.2 oz (113.3 kg)    Body mass index  is 33.58 kg/m.  Performance status (ECOG): 1 - Symptomatic but completely ambulatory  PHYSICAL EXAM:  Physical Exam Constitutional:      General: He is not in acute distress.    Appearance: Normal appearance. He is normal weight.  HENT:     Head: Normocephalic and atraumatic.  Eyes:     General: No scleral icterus.    Extraocular Movements: Extraocular movements intact.     Conjunctiva/sclera: Conjunctivae normal.     Pupils: Pupils are equal, round, and reactive to light.  Cardiovascular:     Rate and Rhythm: Normal rate and regular rhythm.     Pulses: Normal pulses.     Heart sounds: Normal heart sounds. No murmur heard.   No friction rub. No gallop.  Pulmonary:     Effort: Pulmonary effort is normal. No respiratory distress.     Breath sounds: Normal breath sounds.  Abdominal:     General: Bowel sounds are normal. There is no distension.     Palpations: Abdomen is soft. There is no hepatomegaly, splenomegaly or mass.     Tenderness: There is no abdominal tenderness.  Musculoskeletal:        General: Normal range of motion.     Cervical back: Normal range of motion and neck supple.  Right lower leg: No edema.     Left lower leg: No edema.  Lymphadenopathy:     Cervical: No cervical adenopathy.  Skin:    General: Skin is warm and dry.  Neurological:     General: No focal deficit present.     Mental Status: He is alert and oriented to person, place, and time. Mental status is at baseline.  Psychiatric:        Mood and Affect: Mood normal.        Behavior: Behavior normal.        Thought Content: Thought content normal.        Judgment: Judgment normal.   LABS:   CBC Latest Ref Rng & Units 08/30/2021 08/13/2021 08/02/2021  WBC - 11.5 13.1 8.5  Hemoglobin 13.5 - 17.5 15.1 15.2 15.1  Hematocrit 41 - 53 45 45 44  Platelets 150 - 399 90(A) 77(A) 76(A)   CMP Latest Ref Rng & Units 08/30/2021 08/13/2021 08/02/2021  Glucose 65 - 99 mg/dL - - -  BUN 4 - 21 12 13 13    Creatinine 0.6 - 1.3 0.9 0.9 0.9  Sodium 137 - 147 142 143 142  Potassium 3.4 - 5.3 4.1 4.3 4.2  Chloride 99 - 108 105 104 104  CO2 13 - 22 29(A) 30(A) 104(A)  Calcium 8.7 - 10.7 9.5 9.5 9.5  Total Protein 6.5 - 8.1 g/dL - - -  Total Bilirubin 0.3 - 1.2 mg/dL - - -  Alkaline Phos 25 - 125 80 80 72  AST 14 - 40 24 22 25   ALT 10 - 40 21 20 21     STUDIES:  No results found.    HISTORY:   Allergies: No Known Allergies  Current Medications: Current Outpatient Medications  Medication Sig Dispense Refill   acetaminophen (TYLENOL) 500 MG tablet Take 500 mg by mouth every 4 (four) hours as needed for moderate pain.     albuterol (VENTOLIN HFA) 108 (90 Base) MCG/ACT inhaler Inhale 2 puffs into the lungs every 4 (four) hours as needed for wheezing or shortness of breath.     amLODipine (NORVASC) 10 MG tablet Take 10 mg by mouth daily.     Ascorbic Acid (VITAMIN C) 1000 MG tablet Take 1,000 mg by mouth daily.     benazepril (LOTENSIN) 20 MG tablet Take 1 tablet (20 mg total) by mouth daily. 90 tablet 3   CALCIUM PO Take 1 tablet by mouth daily.     escitalopram (LEXAPRO) 10 MG tablet Take 10 mg by mouth daily.     folic acid (FOLVITE) 1 MG tablet Take 1 tablet (1 mg total) by mouth daily. 90 tablet 3   furosemide (LASIX) 40 MG tablet Take 1 tablet (40 mg total) by mouth daily. 90 tablet 3   glipiZIDE (GLUCOTROL XL) 10 MG 24 hr tablet Take 10 mg by mouth daily.     metFORMIN (GLUCOPHAGE-XR) 500 MG 24 hr tablet Take 1,000 mg by mouth 2 (two) times daily.     metoprolol succinate (TOPROL-XL) 100 MG 24 hr tablet Take 1 tablet (100 mg total) by mouth daily. Take with or immediately following a meal. 90 tablet 3   NON FORMULARY Apple pectin 700mg  daily     omeprazole (PRILOSEC) 20 MG capsule Take 20 mg by mouth daily.     oxyCODONE (OXY IR/ROXICODONE) 5 MG immediate release tablet Take 5 mg by mouth every 6 (six) hours as needed for pain.     potassium chloride SA (  KLOR-CON) 20 MEQ tablet Take  2 tablets (40 mEq total) by mouth 3 (three) times daily. 180 tablet 5   predniSONE (DELTASONE) 5 MG tablet Take 1 tablet (5 mg total) by mouth daily with breakfast. Per Dr. Hinton [name], only taking it once a day 90 tablet 1   pregabalin (LYRICA) 25 MG capsule Take 25-50 mg by mouth at bedtime. Have not started yet     vitamin B-12 (CYANOCOBALAMIN) 1000 MCG tablet Take 1,000 mcg by mouth daily.     vitamin k 100 MCG tablet Take 100 mcg by mouth daily.     No current facility-administered medications for this visit.     ASSESSMENT & PLAN:   Assessment:  1. Significant thrombocytopenia, which has been fluctuating up and down since June 2020.  This is chronic immune thrombocytopenic purpura.  We placed him on prednisone 20 mg three times daily in early June 2020.  We were unable to taper his steroids, so he underwent rituximab therapy weekly for 4 weeks in October 2021 with a partial response.  He is not a good candidate for splenectomy unless absolutely necessary.  His platelet count had remained above 75,000, we have steadily tapered his prednisone and prior to his hospital admission in May 2022, he was on prednisone 5mg  daily. He was admitted for COVID and placed back on high dose prednisone, with an extremely slow taper.  His platelet count worsened in August 2022, so we increased the dose back to 5 mg daily, but he has not normalized.       2. Steroid induced diabetes, for which he continues metformin 500 mg twice daily and glipizide 5 mg daily.     3.  Elevated Epstein Barr titers.  This may be indicative of chronic fatigue syndrome, and I advised that he rest as needed.  We had a discussion about this diagnosis.  His sister has him taking zinc, lemon balm, and L lysine.   4.  Sleep apnea, untreated. I have encouraged him to use his CPAP mask for better quality of life and to reduce his daytime sleepiness.  He has been referred to  Pulmonary.   5.  Chronic anxiety, mainly related to his  health.  6.  Admission with acute kidney injury due to dehydration associated with COVID-19, May 2022.   He was placed on an extremely slow prednisone taper.    7.  Significant left hip and lower back pain.  He is following with a spine specialist.  He is undergoing physical therapy. MRI of the lumbar spine revealed degenerative changes of L4-5.  8.  Elevated bilirubin which has persisted. I will check for [id number].   Plan:     He will continue prednisone dose 5 mg daily.  He will continue to follow with Dr. Geraldo Pitter and the spine specialist.  As his platelet count has remained in a fairly stable range, we will start spreading out his appointments as requested. He will return in 1 month for repeat CBC. We will plan to see him back in 2 months with a CBC and comprehensive metabolic panel for repeat clinical assessment. The patient understands the plans discussed today and are in agreement with them. They know to contact our office if he develops concerns prior to his next appointment.  I provided 15 minutes of face-to-face time during this this encounter and > 50% was spent counseling as documented under my assessment and plan.    I, Mike [name], am acting as scribe for [employer].  Mike Rao, MD  I have reviewed this report as typed by the medical scribe, and it is complete and accurate.  Mike Poisson, MD [place] [facility]

## 2021-08-27 ENCOUNTER — Other Ambulatory Visit: Payer: Self-pay | Admitting: Oncology

## 2021-08-27 DIAGNOSIS — D693 Immune thrombocytopenic purpura: Secondary | ICD-10-CM

## 2021-08-28 ENCOUNTER — Other Ambulatory Visit: Payer: Self-pay | Admitting: Oncology

## 2021-08-28 DIAGNOSIS — D693 Immune thrombocytopenic purpura: Secondary | ICD-10-CM

## 2021-08-28 MED ORDER — PREDNISONE 5 MG PO TABS: 5.0000 mg | ORAL_TABLET | Freq: Every day | ORAL | 1 refills | Status: DC

## 2021-08-30 ENCOUNTER — Other Ambulatory Visit: Payer: Self-pay | Admitting: Oncology

## 2021-08-30 ENCOUNTER — Telehealth: Payer: Self-pay | Admitting: Oncology

## 2021-08-30 ENCOUNTER — Other Ambulatory Visit: Payer: Self-pay | Admitting: Hematology and Oncology

## 2021-08-30 ENCOUNTER — Encounter: Payer: Self-pay | Admitting: Oncology

## 2021-08-30 ENCOUNTER — Inpatient Hospital Stay (INDEPENDENT_AMBULATORY_CARE_PROVIDER_SITE_OTHER): Payer: 59 | Admitting: Oncology

## 2021-08-30 ENCOUNTER — Other Ambulatory Visit: Payer: Self-pay

## 2021-08-30 ENCOUNTER — Inpatient Hospital Stay: Payer: 59

## 2021-08-30 VITALS — BP 126/76 | HR 71 | Temp 97.6°F | Resp 16 | Ht 71.0 in | Wt 240.8 lb

## 2021-08-30 DIAGNOSIS — E876 Hypokalemia: Secondary | ICD-10-CM

## 2021-08-30 DIAGNOSIS — D529 Folate deficiency anemia, unspecified: Secondary | ICD-10-CM | POA: Diagnosis not present

## 2021-08-30 DIAGNOSIS — D693 Immune thrombocytopenic purpura: Secondary | ICD-10-CM

## 2021-08-30 DIAGNOSIS — T502X5A Adverse effect of carbonic-anhydrase inhibitors, benzothiadiazides and other diuretics, initial encounter: Secondary | ICD-10-CM

## 2021-08-30 LAB — BASIC METABOLIC PANEL
BUN: 12 (ref 4–21)
CO2: 29 — AB (ref 13–22)
Chloride: 105 (ref 99–108)
Creatinine: 0.9 (ref 0.6–1.3)
Glucose: 111
Potassium: 4.1 (ref 3.4–5.3)
Sodium: 142 (ref 137–147)

## 2021-08-30 LAB — CBC AND DIFFERENTIAL
HCT: 45 (ref 41–53)
Hemoglobin: 15.1 (ref 13.5–17.5)
Neutrophils Absolute: 7.82
Platelets: 90 — AB (ref 150–399)
WBC: 11.5

## 2021-08-30 LAB — HEPATIC FUNCTION PANEL
ALT: 21 (ref 10–40)
AST: 24 (ref 14–40)
Alkaline Phosphatase: 80 (ref 25–125)
Bilirubin, Total: 1.6

## 2021-08-30 LAB — COMPREHENSIVE METABOLIC PANEL
Albumin: 4.8 (ref 3.5–5.0)
Calcium: 9.5 (ref 8.7–10.7)

## 2021-08-30 LAB — CBC: RBC: 5.22 — AB (ref 3.87–5.11)

## 2021-08-30 MED ORDER — PREDNISONE 20 MG PO TABS: 20.0000 mg | ORAL_TABLET | ORAL | 0 refills | Status: DC

## 2021-08-30 MED ORDER — FOLIC ACID 1 MG PO TABS: 1.0000 mg | ORAL_TABLET | Freq: Every day | ORAL | 3 refills | Status: DC

## 2021-08-30 MED ORDER — POTASSIUM CHLORIDE CRYS ER 20 MEQ PO TBCR: 40.0000 meq | EXTENDED_RELEASE_TABLET | Freq: Three times a day (TID) | ORAL | 5 refills | Status: DC

## 2021-08-30 NOTE — Telephone Encounter (Signed)
Per 12/29 los next appt scheduled and confirmed with patient

## 2021-09-10 MED ORDER — PREDNISONE 20 MG PO TABS: 20.0000 mg | ORAL_TABLET | Freq: Every day | ORAL | Status: DC

## 2021-09-10 NOTE — Addendum Note (Signed)
Addended by: Domenic Schwab on: 09/10/2021 01:35 PM   Modules accepted: Orders

## 2021-09-30 ENCOUNTER — Telehealth: Payer: Self-pay

## 2021-09-30 ENCOUNTER — Other Ambulatory Visit: Payer: Self-pay | Admitting: Oncology

## 2021-09-30 ENCOUNTER — Other Ambulatory Visit: Payer: Self-pay

## 2021-09-30 ENCOUNTER — Inpatient Hospital Stay: Payer: 59 | Attending: Oncology

## 2021-09-30 ENCOUNTER — Other Ambulatory Visit: Payer: Self-pay | Admitting: Hematology and Oncology

## 2021-09-30 DIAGNOSIS — D693 Immune thrombocytopenic purpura: Secondary | ICD-10-CM

## 2021-09-30 LAB — CBC AND DIFFERENTIAL
HCT: 44 (ref 41–53)
Hemoglobin: 14.9 (ref 13.5–17.5)
Neutrophils Absolute: 4.96
Platelets: 76 — AB (ref 150–399)
WBC: 8.4

## 2021-09-30 LAB — CBC: RBC: 5.09 (ref 3.87–5.11)

## 2021-09-30 NOTE — Telephone Encounter (Signed)
I called at the request of pt reference lab results. I called pt home number listed in chart. There was no answer, I left Vm with a call back number and ext.

## 2021-10-04 ENCOUNTER — Encounter: Payer: Self-pay | Admitting: Oncology

## 2021-10-22 NOTE — Progress Notes (Signed)
[facility]  [address] [place],  [place]  [address] [phone]  Clinic Day:  10/28/2021  Referring physician: Julianne Handler, NP  This document serves as a record of services personally performed by Gery Pray, MD. It was created on their behalf by Curry,Lauren E, a trained medical scribe. The creation of this record is based on the scribe's personal observations and the provider's statements to them.  CHIEF COMPLAINT:  CC:  Immune thrombocytopenic purpura  Current Treatment:   Prednisone 5 mg daily  HISTORY OF PRESENT ILLNESS:  Mike [name] is a 51 y.o. male with a history of severe thrombocytopenia, felt to represent immune thrombocytopenic purpura.  He has had thrombocytopenia, which has fluctuated up and down since June 2020.  Further laboratory evaluation did not reveal any other specific etiology for his thrombocytopenia.  He had mildly elevated liver transaminases.  Abdominal ultrasound revealed increased echogenicity of hepatic parenchyma suggesting hepatic steatosis, with probable fatty sparing adjacent to gallbladder fossa.  No other abnormality was seen.  We recommended bone marrow examination, but the patient and his mother declined.  The platelet count continued to fluctuate up and down, but as his platelet count remained 25,000 in June 2021, he was started on prednisone 20 mg 3 times daily, with a good response.  He has had leukocytosis secondary to the high-dose prednisone.  He also has had oral candidiasis on occasion. Mike [name] titers were elevated, so this may be representative of chronic fatigue syndrome. We decreased his prednisone to 45 mg in late September and platelets remained stable at 97,000.  Since we were unable to taper him off the prednisone he was treated with rituximab weekly for 4 weeks in October and November 2021.  We have been slowly tapering the prednisone.  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  He has also had hypokalemia resolved with oral supplementation.    On May 18th, his platelet count was 151,000, so I decreased his prednisone to 5 mg daily. He was admitted with COVID-19 on May 24th after leaving work earlier due to altered mental status and generalized weakness. He had acute kidney injury with a creatinine of 2.2, which normalized with IV hydration. He had fever, but no respiratory symptoms. He was discharged on a prolonged prednisone taper over 15 weeks.  He had been having some chest pain and dyspnea since discontinuing indapamide, so was referred to Dr. Tomie China in Cardiology. He placed him on furosemide 40 mg daily and metoprolol 50 mg daily.  He had had problems with hypokalemia for a long time but this worsened with the diuretic.  He also had him increase his potassium to 40 mg twice daily.  He states his lower extremity edema has resolved with the furosemide.  The ECHO shows left ventricular hypertrophy with an EF between 55-60%, and the CT of the coronary arteries looks good.  He is currently on gabapentin 300 mg BID.  He was on a extremely slow taper of the prednisone, but his platelets dropped to 73,000 when the dose was dropped to 5 mg three times per week.  I increased it back to 5 mg daily and stopped the gabapentin as it wasn't helping.  MRI of the lumbar spine from October 27th revealed degenerative changes of the lumbar spine most prominent L4-5.  INTERVAL HISTORY:  Mike [name] is here for routine follow up and states that he was having issues with his sinuses. This  is doing better lately. He continues prednisone 5 mg daily. He continues to have pain of the back and arthralgias of the left hip, and is following with the spine specialist. He continues to follow with Dr. Geraldo Pitter, and will see him on April 24th. He rates his pain as a 5/10 today. Platelets have improved from 76,000 to 108,000, and white count and hemoglobin remain  normal. Chemistries are unremarkable except for a bilirubin of 1.6, stable. His  appetite is good, and he has gained 1 and 1/2 pounds since his last visit.  He denies fever, chills or other signs of infection.  He denies nausea, vomiting, bowel issues, or abdominal pain.  He denies sore throat, cough, dyspnea, or chest pain.   REVIEW OF SYSTEMS:  Review of Systems  Constitutional: Negative.  Negative for appetite change, chills, fatigue, fever and unexpected weight change.  HENT:  Negative.    Eyes: Negative.   Respiratory: Negative.  Negative for chest tightness, cough, hemoptysis, shortness of breath and wheezing.   Cardiovascular: Negative.  Negative for chest pain, leg swelling and palpitations.  Gastrointestinal: Negative.  Negative for abdominal distention, abdominal pain, blood in stool, constipation, diarrhea, nausea and vomiting.  Endocrine: Negative.   Genitourinary: Negative.  Negative for difficulty urinating, dysuria, frequency and hematuria.   Musculoskeletal:  Positive for arthralgias (of the left hip) and back pain (chronic). Negative for flank pain, gait problem and myalgias.  Skin: Negative.   Neurological:  Negative for dizziness, extremity weakness, gait problem, headaches, light-headedness, numbness, seizures and speech difficulty.  Hematological: Negative.   Psychiatric/Behavioral:  Negative for depression and sleep disturbance. The patient is nervous/anxious.   All other systems reviewed and are negative.  VITALS:  Blood pressure 135/68, pulse 67, temperature 98 F (36.7 C), temperature source Oral, resp. rate 16, height 5' 9.75" (1.772 m), weight 242 lb 6.4 oz (110 kg), SpO2 98 %.  Wt Readings from Last 3 Encounters:  10/28/21 242 lb 6.4 oz (110 kg)  08/30/21 240 lb 12.8 oz (109.2 kg)  08/02/21 243 lb 12.8 oz (110.6 kg)    Body mass index is 35.03 kg/m.  Performance status (ECOG): 1 - Symptomatic but completely ambulatory  PHYSICAL EXAM:  Physical  Exam Constitutional:      General: He is not in acute distress.    Appearance: Normal appearance. He is normal weight.  HENT:     Head: Normocephalic and atraumatic.  Eyes:     General: No scleral icterus.    Extraocular Movements: Extraocular movements intact.     Conjunctiva/sclera: Conjunctivae normal.     Pupils: Pupils are equal, round, and reactive to light.  Cardiovascular:     Rate and Rhythm: Normal rate and regular rhythm.     Pulses: Normal pulses.     Heart sounds: Normal heart sounds. No murmur heard.   No friction rub. No gallop.  Pulmonary:     Effort: Pulmonary effort is normal. No respiratory distress.     Breath sounds: Normal breath sounds.  Abdominal:     General: Bowel sounds are normal. There is no distension.     Palpations: Abdomen is soft. There is no hepatomegaly, splenomegaly or mass.     Tenderness: There is no abdominal tenderness.  Musculoskeletal:        General: Normal range of motion.     Cervical back: Normal range of motion and neck supple.     Right lower leg: No edema.     Left lower leg: No  edema.  Lymphadenopathy:     Cervical: No cervical adenopathy.  Skin:    General: Skin is warm and dry.  Neurological:     General: No focal deficit present.     Mental Status: He is alert and oriented to person, place, and time. Mental status is at baseline.  Psychiatric:        Mood and Affect: Mood normal.        Behavior: Behavior normal.        Thought Content: Thought content normal.        Judgment: Judgment normal.   LABS:   CBC Latest Ref Rng & Units 10/28/2021 09/30/2021 08/30/2021  WBC - 9.0 8.4 11.5  Hemoglobin 13.5 - 17.5 14.9 14.9 15.1  Hematocrit 41 - 53 43 44 45  Platelets 150 - 399 108(A) 76(A) 90(A)   CMP Latest Ref Rng & Units 08/30/2021 08/13/2021 08/02/2021  Glucose 65 - 99 mg/dL - - -  BUN 4 - 21 12 13 13   Creatinine 0.6 - 1.3 0.9 0.9 0.9  Sodium 137 - 147 142 143 142  Potassium 3.4 - 5.3 4.1 4.3 4.2  Chloride 99 - 108  105 104 104  CO2 13 - 22 29(A) 30(A) 104(A)  Calcium 8.7 - 10.7 9.5 9.5 9.5  Total Protein 6.5 - 8.1 g/dL - - -  Total Bilirubin 0.3 - 1.2 mg/dL - - -  Alkaline Phos 25 - 125 80 80 72  AST 14 - 40 24 22 25   ALT 10 - 40 21 20 21     STUDIES:  No results found.    HISTORY:   Allergies: No Known Allergies  Current Medications: Current Outpatient Medications  Medication Sig Dispense Refill   acetaminophen (TYLENOL) 500 MG tablet Take 500 mg by mouth every 4 (four) hours as needed for moderate pain.     albuterol (VENTOLIN HFA) 108 (90 Base) MCG/ACT inhaler Inhale 2 puffs into the lungs every 4 (four) hours as needed for wheezing or shortness of breath.     amLODipine (NORVASC) 10 MG tablet Take 10 mg by mouth daily.     Ascorbic Acid (VITAMIN C) 1000 MG tablet Take 1,000 mg by mouth daily.     benazepril (LOTENSIN) 20 MG tablet Take 1 tablet (20 mg total) by mouth daily. 90 tablet 3   CALCIUM PO Take 1 tablet by mouth daily.     escitalopram (LEXAPRO) 10 MG tablet Take 10 mg by mouth daily.     folic acid (FOLVITE) 1 MG tablet Take 1 tablet (1 mg total) by mouth daily. 90 tablet 3   furosemide (LASIX) 40 MG tablet Take 1 tablet (40 mg total) by mouth daily. 90 tablet 3   glipiZIDE (GLUCOTROL XL) 10 MG 24 hr tablet Take 10 mg by mouth daily.     metFORMIN (GLUCOPHAGE-XR) 500 MG 24 hr tablet Take 1,000 mg by mouth 2 (two) times daily.     metoprolol succinate (TOPROL-XL) 100 MG 24 hr tablet Take 1 tablet (100 mg total) by mouth daily. Take with or immediately following a meal. 90 tablet 3   NON FORMULARY Apple pectin 700mg  daily     omeprazole (PRILOSEC) 20 MG capsule Take 20 mg by mouth daily.     oxyCODONE (OXY IR/ROXICODONE) 5 MG immediate release tablet Take 5 mg by mouth every 6 (six) hours as needed for pain.     potassium chloride SA (KLOR-CON M) 20 MEQ tablet Take 2 tablets (40 mEq total) by mouth  3 (three) times daily. 180 tablet 5   predniSONE (DELTASONE) 20  MG tablet Take 1 tablet (20 mg total) by mouth as directed. 90 tablet 0   pregabalin (LYRICA) 25 MG capsule Take 25-50 mg by mouth at bedtime. Have not started yet     vitamin B-12 (CYANOCOBALAMIN) 1000 MCG tablet Take 1,000 mcg by mouth daily.     vitamin k 100 MCG tablet Take 100 mcg by mouth daily.     No current facility-administered medications for this visit.     ASSESSMENT & PLAN:   Assessment:  1. Significant thrombocytopenia, which has been fluctuating up and down since June 2020.  This is chronic immune thrombocytopenic purpura.  We placed him on prednisone 20 mg three times daily in early June 2020.  We were unable to taper his steroids, so he underwent rituximab therapy weekly for 4 weeks in October 2021 with a partial response.  He is not a good candidate for splenectomy unless absolutely necessary.  His platelet count had remained above 75,000, we have steadily tapered his prednisone and prior to his hospital admission in May 2022, he was on prednisone 5mg  daily. He was admitted for COVID and placed back on high dose prednisone, with an extremely slow taper.  His platelet count worsened in August 2022, so we increased the dose back to 5 mg daily, but he has not normalized.       2. Steroid induced diabetes, for which he continues metformin 500 mg twice daily and glipizide 5 mg daily.     3.  Elevated Epstein Barr titers.  This may be indicative of chronic fatigue syndrome, and I advised that he rest as needed.  We had a discussion about this diagnosis.  His sister has him taking zinc, lemon balm, and L lysine.   4.  Sleep apnea, untreated. I have encouraged him to use his CPAP mask for better quality of life and to reduce his daytime sleepiness.  He has been referred to  Pulmonary.   5.  Chronic anxiety, mainly related to his health.  6.  Admission with acute kidney injury due to dehydration associated with COVID-19, May 2022.   He was placed on an extremely slow prednisone  taper.    7.  Significant left hip and lower back pain.  He is following with a spine specialist.  He is undergoing physical therapy. MRI of the lumbar spine revealed degenerative changes of L4-5.  8.  Elevated bilirubin which has persisted but remains stable. I suspect possible Gilbert's syndrome, but we will hold off on ordering [id number] as it would not change our plan of care.   Plan:     He will continue prednisone dose 5 mg daily.  He will continue to follow with Dr. Geraldo Pitter and the spine specialist. He will return every 4 weeks for repeat CBC. We will plan to see him back in 12 weeks with a CBC and comprehensive metabolic panel for repeat clinical assessment. The patient understands the plans discussed today and are in agreement with them. They know to contact our office if he develops concerns prior to his next appointment.  I provided 15 minutes of face-to-face time during this this encounter and > 50% was spent counseling as documented under my assessment and plan.    I, Rita Ohara, am acting as scribe for Derwood Kaplan, MD  I have reviewed this report as typed by the medical scribe, and it is complete and accurate.  [name],  MD [place] [facility]

## 2021-10-28 ENCOUNTER — Telehealth: Payer: Self-pay | Admitting: Oncology

## 2021-10-28 ENCOUNTER — Other Ambulatory Visit: Payer: Self-pay

## 2021-10-28 ENCOUNTER — Inpatient Hospital Stay: Payer: 59

## 2021-10-28 ENCOUNTER — Other Ambulatory Visit: Payer: Self-pay | Admitting: Oncology

## 2021-10-28 ENCOUNTER — Encounter: Payer: Self-pay | Admitting: Oncology

## 2021-10-28 ENCOUNTER — Inpatient Hospital Stay: Payer: 59 | Attending: Oncology | Admitting: Oncology

## 2021-10-28 VITALS — BP 135/68 | HR 67 | Temp 98.0°F | Resp 16 | Ht 69.75 in | Wt 242.4 lb

## 2021-10-28 DIAGNOSIS — D693 Immune thrombocytopenic purpura: Secondary | ICD-10-CM

## 2021-10-28 LAB — HEPATIC FUNCTION PANEL
ALT: 22 (ref 10–40)
AST: 22 (ref 14–40)
Alkaline Phosphatase: 67 (ref 25–125)
Bilirubin, Total: 1.6

## 2021-10-28 LAB — CBC AND DIFFERENTIAL
HCT: 43 (ref 41–53)
Hemoglobin: 14.9 (ref 13.5–17.5)
Neutrophils Absolute: 5.76
Platelets: 108 — AB (ref 150–399)
WBC: 9

## 2021-10-28 LAB — COMPREHENSIVE METABOLIC PANEL
Albumin: 4.5 (ref 3.5–5.0)
Calcium: 9.5 (ref 8.7–10.7)

## 2021-10-28 LAB — BASIC METABOLIC PANEL
BUN: 9 (ref 4–21)
CO2: 29 — AB (ref 13–22)
Chloride: 104 (ref 99–108)
Creatinine: 0.8 (ref 0.6–1.3)
Glucose: 104
Potassium: 4.2 (ref 3.4–5.3)
Sodium: 143 (ref 137–147)

## 2021-10-28 LAB — CBC: RBC: 4.98 (ref 3.87–5.11)

## 2021-10-28 NOTE — Telephone Encounter (Signed)
Patient has been scheduled for follow-up visit per 10/28/21 los. Pt given an appt calendar with date and time. ° °

## 2021-11-06 ENCOUNTER — Other Ambulatory Visit: Payer: Self-pay | Admitting: Cardiology

## 2021-11-06 DIAGNOSIS — I10 Essential (primary) hypertension: Secondary | ICD-10-CM

## 2021-11-14 ENCOUNTER — Ambulatory Visit: Payer: 59 | Admitting: Cardiology

## 2021-11-25 ENCOUNTER — Other Ambulatory Visit: Payer: Self-pay

## 2021-11-25 ENCOUNTER — Inpatient Hospital Stay: Payer: 59 | Attending: Oncology

## 2021-11-25 DIAGNOSIS — D693 Immune thrombocytopenic purpura: Secondary | ICD-10-CM

## 2021-11-25 LAB — CBC AND DIFFERENTIAL
HCT: 42 (ref 41–53)
Hemoglobin: 14.1 (ref 13.5–17.5)
Neutrophils Absolute: 5.61
Platelets: 110 10*3/uL — AB (ref 150–400)
WBC: 9.5

## 2021-11-25 LAB — HEPATIC FUNCTION PANEL
ALT: 24 U/L (ref 10–40)
AST: 24 (ref 14–40)
Alkaline Phosphatase: 58 (ref 25–125)
Bilirubin, Total: 1

## 2021-11-25 LAB — BASIC METABOLIC PANEL
BUN: 16 (ref 4–21)
CO2: 27 — AB (ref 13–22)
Chloride: 104 (ref 99–108)
Creatinine: 0.9 (ref 0.6–1.3)
Glucose: 92
Potassium: 4.3 mEq/L (ref 3.5–5.1)
Sodium: 141 (ref 137–147)

## 2021-11-25 LAB — CBC: RBC: 4.88 (ref 3.87–5.11)

## 2021-11-25 LAB — COMPREHENSIVE METABOLIC PANEL
Albumin: 4.6 (ref 3.5–5.0)
Calcium: 8.7 (ref 8.7–10.7)

## 2021-12-18 ENCOUNTER — Other Ambulatory Visit: Payer: Self-pay

## 2021-12-19 ENCOUNTER — Telehealth: Payer: Self-pay | Admitting: Cardiology

## 2021-12-19 DIAGNOSIS — I2510 Atherosclerotic heart disease of native coronary artery without angina pectoris: Secondary | ICD-10-CM

## 2021-12-19 DIAGNOSIS — I10 Essential (primary) hypertension: Secondary | ICD-10-CM

## 2021-12-19 NOTE — Telephone Encounter (Signed)
Patient's sister is requesting to have lab orders sent to oncologist, Dr. Lavonda Jumbo office so that he can have all his labs completed during 1 visit. ?

## 2021-12-23 ENCOUNTER — Encounter: Payer: Self-pay | Admitting: Cardiology

## 2021-12-23 ENCOUNTER — Ambulatory Visit (INDEPENDENT_AMBULATORY_CARE_PROVIDER_SITE_OTHER): Payer: 59 | Admitting: Cardiology

## 2021-12-23 ENCOUNTER — Other Ambulatory Visit: Payer: Self-pay | Admitting: Oncology

## 2021-12-23 ENCOUNTER — Inpatient Hospital Stay: Payer: 59 | Attending: Oncology

## 2021-12-23 VITALS — BP 120/66 | HR 60 | Ht 71.0 in | Wt 231.4 lb

## 2021-12-23 DIAGNOSIS — I2510 Atherosclerotic heart disease of native coronary artery without angina pectoris: Secondary | ICD-10-CM

## 2021-12-23 DIAGNOSIS — D693 Immune thrombocytopenic purpura: Secondary | ICD-10-CM

## 2021-12-23 DIAGNOSIS — E782 Mixed hyperlipidemia: Secondary | ICD-10-CM

## 2021-12-23 DIAGNOSIS — I10 Essential (primary) hypertension: Secondary | ICD-10-CM

## 2021-12-23 DIAGNOSIS — Z79899 Other long term (current) drug therapy: Secondary | ICD-10-CM | POA: Diagnosis not present

## 2021-12-23 LAB — COMPREHENSIVE METABOLIC PANEL
Albumin: 4.7 (ref 3.5–5.0)
Calcium: 9.1 (ref 8.7–10.7)

## 2021-12-23 LAB — CBC AND DIFFERENTIAL
HCT: 42 (ref 41–53)
Hemoglobin: 14 (ref 13.5–17.5)
Neutrophils Absolute: 3.97
Platelets: 95 10*3/uL — AB (ref 150–400)
WBC: 6.5

## 2021-12-23 LAB — LIPID PANEL
Cholesterol: 162 mg/dL (ref 0–200)
HDL: 40 mg/dL — ABNORMAL LOW (ref >40)
LDL Cholesterol: 105 mg/dL — ABNORMAL HIGH (ref 0–99)
Total CHOL/HDL Ratio: 4.1 RATIO
Triglycerides: 86 mg/dL (ref <150)
VLDL: 17 mg/dL (ref 0–40)

## 2021-12-23 LAB — BASIC METABOLIC PANEL
BUN: 12 (ref 4–21)
CO2: 31 — AB (ref 13–22)
Chloride: 102 (ref 99–108)
Creatinine: 0.8 (ref 0.6–1.3)
Glucose: 94
Potassium: 3.9 mEq/L (ref 3.5–5.1)
Sodium: 144 (ref 137–147)

## 2021-12-23 LAB — HEPATIC FUNCTION PANEL
ALT: 18 U/L (ref 10–40)
AST: 20 (ref 14–40)
Alkaline Phosphatase: 59 (ref 25–125)
Bilirubin, Total: 1.6

## 2021-12-23 LAB — CBC: RBC: 4.77 (ref 3.87–5.11)

## 2021-12-23 LAB — TSH: TSH: 2.385 u[IU]/mL (ref 0.350–4.500)

## 2021-12-23 NOTE — Telephone Encounter (Signed)
Labs added and [name] is aware. Message sent to the [facility]. ?

## 2021-12-23 NOTE — Patient Instructions (Signed)
Check on Zetia or Nexlixet. ? ?Medication Instructions:  ?Your physician recommends that you continue on your current medications as directed. Please refer to the Current Medication list given to you today.  ?*If you need a refill on your cardiac medications before your next appointment, please call your pharmacy* ? ? ?Lab Work: ?None ordered ?If you have labs (blood work) drawn today and your tests are completely normal, you will receive your results only by: ?MyChart Message (if you have MyChart) OR ?A paper copy in the mail ?If you have any lab test that is abnormal or we need to change your treatment, we will call you to review the results. ? ? ?Testing/Procedures: ?None ordered ? ? ?Follow-Up: ?At [facility], you and your health needs are our priority.  As part of our continuing mission to provide you with exceptional heart care, we have created designated Provider Care Teams.  These Care Teams include your primary Cardiologist (physician) and Advanced Practice Providers (APPs -  Physician Assistants and Nurse Practitioners) who all work together to provide you with the care you need, when you need it. ? ?We recommend signing up for the patient portal called "MyChart".  Sign up information is provided on this After Visit Summary.  MyChart is used to connect with patients for Virtual Visits (Telemedicine).  Patients are able to view lab/test results, encounter notes, upcoming appointments, etc.  Non-urgent messages can be sent to your provider as well.   ?To learn more about what you can do with MyChart, go to [email or web address].   ? ?Your next appointment:   ?9 month(s) ? ?The format for your next appointment:   ?In Person ? ?Provider:   ?Belva Crome, MD ? ? ?Other Instructions ?NA ? ?

## 2021-12-23 NOTE — Progress Notes (Signed)
?Cardiology Office Note:   ? ?Date:  12/23/2021  ? ?ID:  Mike [name], DOB 05/02/1971, MRN [id number] ? ?PCP:  Mike Paddy, NP  ?Cardiologist:  Mike Lindau, MD  ? ?Referring MD: Mike Paddy, NP  ? ? ?ASSESSMENT:   ? ?1. Coronary artery disease involving native coronary artery of native heart without angina pectoris   ?2. Primary hypertension   ?3. Mixed dyslipidemia   ?4. Immune thrombocytopenic purpura ([name])   ? ?PLAN:   ? ?In order of problems listed above: ? ?Coronary artery disease: Mild in nature: Secondary prevention stressed.  Importance of compliance with diet and medication stressed and he vocalized understanding.  He was advised to walk at least half an hour a day 5 days a week and he promises to do so. ?Essential hypertension: Blood pressure stable and diet was emphasized.  Lifestyle modification urged. ?Mixed dyslipidemia: Lipids reviewed.  He is going to check with his hematologist to see if we can initiate Zetia or Nexletol and get back to [place].  Diet emphasized. ?Diabetes mellitus and obesity: Risks emphasized he is doing better with regular walking and diet. ?Patient will be seen in follow-up appointment in 6 months or earlier if the patient has any concerns ? ? ? ?Medication Adjustments/Labs and Tests Ordered: ?Current medicines are reviewed at length with the patient today.  Concerns regarding medicines are outlined above.  ?No orders of the defined types were placed in this encounter. ? ?No orders of the defined types were placed in this encounter. ? ? ? ?No chief complaint on file. ?  ? ?History of Present Illness:   ? ?Mike [name] is a 51 y.o. male.  Patient has past medical history of coronary artery disease which is mild and nonobstructive in nature, essential hypertension, mixed dyslipidemia diabetes mellitus and obesity.  He is now acting and walking on a regular basis.  He was told by his primary care and hematologist not to take statins because of ITP.  At the time of my  evaluation, the patient is alert awake oriented and in no distress. ? ?Past Medical History:  ?Diagnosis Date  ? Acute kidney injury ([place])   ? Allergic rhinitis with postnasal drip   ? Allergy   ? Anemia   ? CAD (coronary artery disease) 05/17/2021  ? Diuretic-induced hypokalemia 05/02/2021  ? Dyspnea on exertion   ? Folate deficiency anemia 05/02/2021  ? GERD (gastroesophageal reflux disease)   ? GERD without esophagitis   ? Hyperbilirubinemia 08/03/2019  ? Hypertension   ? Hyponatremia   ? Immune thrombocytopenic purpura ([place])   ? Immune thrombocytopenic purpura ([place])   ? Metabolic syndrome   ? Mixed dyslipidemia 05/17/2021  ? Morbid obesity ([place])   ? Obesity (BMI 35.0-39.9 without comorbidity) 04/18/2021  ? Obstructive sleep apnea hypopnea, moderate   ? Post-COVID syndrome   ? Shortness of breath   ? Sleep apnea   ? wears a mouth piece  ? Steroid-induced diabetes ([place])   ? Stress and adjustment reaction   ? Thrombocytopenia, unspecified ([place])   ? ? ?Past Surgical History:  ?Procedure Laterality Date  ? NO PAST SURGERIES    ? ? ?Current Medications: ?Current Meds  ?Medication Sig  ? acetaminophen (TYLENOL) 500 MG tablet Take 500 mg by mouth every 4 (four) hours as needed for moderate pain.  ? albuterol (VENTOLIN HFA) 108 (90 Base) MCG/ACT inhaler Inhale 2 puffs into the lungs every 4 (four) hours as needed for wheezing or  shortness of breath.  ? amLODipine (NORVASC) 10 MG tablet Take 10 mg by mouth daily.  ? Ascorbic Acid (VITAMIN C) 1000 MG tablet Take 1,000 mg by mouth daily.  ? benazepril (LOTENSIN) 20 MG tablet Take 1 tablet (20 mg total) by mouth daily.  ? CALCIUM PO Take 1 tablet by mouth daily.  ? escitalopram (LEXAPRO) 10 MG tablet Take 10 mg by mouth daily.  ? folic acid (FOLVITE) 1 MG tablet Take 1 tablet (1 mg total) by mouth daily.  ? furosemide (LASIX) 40 MG tablet Take 1 tablet (40 mg total) by mouth daily.  ? glipiZIDE (GLUCOTROL XL) 10 MG 24 hr tablet Take 10 mg by mouth daily.  ? metFORMIN (GLUCOPHAGE-XR)  500 MG 24 hr tablet Take 1,000 mg by mouth 2 (two) times daily.  ? metoprolol succinate (TOPROL-XL) 100 MG 24 hr tablet Take 100 mg by mouth daily.  ? NON FORMULARY Take 700 mg by mouth daily. Apple pectin   ? omeprazole (PRILOSEC) 20 MG capsule Take 20 mg by mouth daily.  ? oxyCODONE (OXY IR/ROXICODONE) 5 MG immediate release tablet Take 5 mg by mouth every 6 (six) hours as needed for pain.  ? potassium chloride SA (KLOR-CON M) 20 MEQ tablet Take 2 tablets (40 mEq total) by mouth 3 (three) times daily.  ? predniSONE (DELTASONE) 20 MG tablet Take 1 tablet (20 mg total) by mouth as directed.  ? pregabalin (LYRICA) 25 MG capsule Take 25-50 mg by mouth at bedtime. Have not started yet  ? vitamin B-12 (CYANOCOBALAMIN) 1000 MCG tablet Take 1,000 mcg by mouth daily.  ? vitamin k 100 MCG tablet Take 100 mcg by mouth daily.  ?  ? ?Allergies:   Patient has no known allergies.  ? ?Social History  ? ?Socioeconomic History  ? Marital status: Divorced  ?  Spouse name: Not on file  ? Number of children: 1  ? Years of education: Not on file  ? Highest education level: Not on file  ?Occupational History  ? Not on file  ?Tobacco Use  ? Smoking status: Never  ? Smokeless tobacco: Former  ?Substance and Sexual Activity  ? Alcohol use: Not Currently  ? Drug use: Not Currently  ? Sexual activity: Not Currently  ?Other Topics Concern  ? Not on file  ?Social History Narrative  ? Not on file  ? ?Social Determinants of Health  ? ?Financial Resource Strain: Not on file  ?Food Insecurity: Not on file  ?Transportation Needs: Not on file  ?Physical Activity: Not on file  ?Stress: Not on file  ?Social Connections: Not on file  ?  ? ?Family History: ?The patient's family history includes Breast cancer in his maternal grandmother; Heart attack in his father, paternal grandfather, and paternal uncle; Thrombosis in his maternal aunt, maternal grandmother, and maternal uncle. ? ?ROS:   ?Please see the history of present illness.    ?All other systems  reviewed and are negative. ? ?EKGs/Labs/Other Studies Reviewed:   ? ?The following studies were reviewed today: ?I discussed my findings with the patient at length ? ? ?Recent Labs: ?12/23/2021: ALT 18; BUN 12; Creatinine 0.8; Hemoglobin 14.0; Platelets 95; Potassium 3.9; Sodium 144; TSH 2.385  ?Recent Lipid Panel ?   ?Component Value Date/Time  ? CHOL 162 12/23/2021 0916  ? TRIG 86 12/23/2021 0916  ? HDL 40 (L) 12/23/2021 0916  ? CHOLHDL 4.1 12/23/2021 0916  ? VLDL 17 12/23/2021 0916  ? [place] 105 (H) 12/23/2021 [id number]  ? ? ?  Physical Exam:   ? ?VS:  BP 120/66   Pulse 60   Ht 5\' 11"  (1.803 m)   Wt 231 lb 6.4 oz (105 kg)   SpO2 98%   BMI 32.27 kg/m?    ? ?Wt Readings from Last 3 Encounters:  ?12/23/21 231 lb 6.4 oz (105 kg)  ?10/28/21 242 lb 6.4 oz (110 kg)  ?08/30/21 240 lb 12.8 oz (109.2 kg)  ?  ? ?GEN: Patient is in no acute distress ?HEENT: Normal ?NECK: No JVD; No carotid bruits ?LYMPHATICS: No lymphadenopathy ?CARDIAC: Hear sounds regular, 2/6 systolic murmur at the apex. ?RESPIRATORY:  Clear to auscultation without rales, wheezing or rhonchi  ?ABDOMEN: Soft, non-tender, non-distended ?MUSCULOSKELETAL:  No edema; No deformity  ?SKIN: Warm and dry ?NEUROLOGIC:  Alert and oriented x 3 ?PSYCHIATRIC:  Normal affect  ? ?Signed, ?Mike Lindau, MD  ?12/23/2021 1:26 PM    ?[place]  ?

## 2021-12-24 ENCOUNTER — Telehealth: Payer: Self-pay

## 2021-12-24 NOTE — Telephone Encounter (Signed)
Notified patient of lab results 

## 2021-12-26 ENCOUNTER — Other Ambulatory Visit: Payer: 59

## 2022-01-13 ENCOUNTER — Other Ambulatory Visit: Payer: Self-pay | Admitting: Oncology

## 2022-01-13 DIAGNOSIS — D693 Immune thrombocytopenic purpura: Secondary | ICD-10-CM

## 2022-01-13 NOTE — Telephone Encounter (Signed)
Chronic ITP ?

## 2022-01-19 ENCOUNTER — Other Ambulatory Visit: Payer: Self-pay | Admitting: Oncology

## 2022-01-19 DIAGNOSIS — D693 Immune thrombocytopenic purpura: Secondary | ICD-10-CM

## 2022-01-19 NOTE — Progress Notes (Signed)
[facility]  [address] [place],  [place]  [address] [phone]  Clinic Day:  01/20/22  Referring physician: Julianne Handler, NP  CHIEF COMPLAINT:  CC:  Immune thrombocytopenic purpura, chronic  Current Treatment:   Prednisone 5 mg daily  HISTORY OF PRESENT ILLNESS:  Mike [name] is a 51 y.o. male with a history of severe thrombocytopenia, felt to represent immune thrombocytopenic purpura.  He has had thrombocytopenia, which has fluctuated up and down since June 2020.  Further laboratory evaluation did not reveal any other specific etiology for his thrombocytopenia.  He had mildly elevated liver transaminases.  Abdominal ultrasound revealed increased echogenicity of hepatic parenchyma suggesting hepatic steatosis, with probable fatty sparing adjacent to gallbladder fossa.  No other abnormality was seen.  We recommended bone marrow examination, but the patient and his mother declined.  The platelet count continued to fluctuate up and down, but as his platelet count remained 25,000 in June 2021, he was started on prednisone 20 mg 3 times daily, with a good response.  He has had leukocytosis secondary to the high-dose prednisone.  He also has had oral candidiasis on occasion. Malachi Carl titers were elevated, so this may be representative of chronic fatigue syndrome. We decreased his prednisone to 45 mg in late September and platelets remained stable at 97,000.  Since we were unable to taper him off the prednisone he was treated with rituximab weekly for 4 weeks in October and November 2021.  We have been slowly tapering the prednisone.  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  He has also had hypokalemia resolved with oral supplementation.    On May 18th, his platelet count was 151,000, so I decreased his prednisone to 5 mg daily. He was admitted with COVID-19 on May 24th after leaving work earlier due  to altered mental status and generalized weakness. He had acute kidney injury with a creatinine of 2.2, which normalized with IV hydration. He had fever, but no respiratory symptoms. He was discharged on a prolonged prednisone taper over 15 weeks.  He had been having some chest pain and dyspnea since discontinuing indapamide, so was referred to Dr. Tomie China in Cardiology. He placed him on furosemide 40 mg daily and metoprolol 50 mg daily.  He had had problems with hypokalemia for a long time but this worsened with the diuretic.  He also had him increase his potassium to 40 mg twice daily.  He states his lower extremity edema has resolved with the furosemide.  The ECHO shows left ventricular hypertrophy with an EF between 55-60%, and the CT of the coronary arteries looks good.  He is currently on gabapentin 300 mg BID.  He was on a extremely slow taper of the prednisone, but his platelets dropped to 73,000 when the dose was dropped to 5 mg three times per week.  I increased it back to 5 mg daily and stopped the gabapentin as it wasn't helping.  MRI of the lumbar spine from October 27th revealed degenerative changes of the lumbar spine most prominent L4-5.  INTERVAL HISTORY:  Mike [name] is here for routine follow up and states that he was having issues with sciatica.  This was so severe that he was bed ridden for 1 month.  He continues prednisone 5 mg daily. He continues to have pain of the back and arthralgias of the left hip, and is following with the spine specialist. He continues to follow with Dr.  Revankar, who recommended medication for his cholesterol with Zetia or Nexletol.  At this time, I would recommend holding off on it with his many other issues.  He rates his pain as a 5/10 today. Platelets have improved from 95,000 to 143,000, and white count and hemoglobin remain normal. Chemistries are unremarkable except for a bilirubin of 1.6, stable. His  appetite is fair, and he has lost 10 pounds since his last  visit.  He denies fever, chills or other signs of infection.  He denies nausea, vomiting, bowel issues, or abdominal pain.  He denies sore throat, cough, dyspnea, or chest pain.   REVIEW OF SYSTEMS:  Review of Systems  Constitutional: Negative.  Negative for appetite change, chills, fatigue, fever and unexpected weight change.  HENT:  Negative.    Eyes: Negative.   Respiratory: Negative.  Negative for chest tightness, cough, hemoptysis, shortness of breath and wheezing.   Cardiovascular: Negative.  Negative for chest pain, leg swelling and palpitations.  Gastrointestinal: Negative.  Negative for abdominal distention, abdominal pain, blood in stool, constipation, diarrhea, nausea and vomiting.  Endocrine: Negative.   Genitourinary: Negative.  Negative for difficulty urinating, dysuria, frequency and hematuria.   Musculoskeletal:  Positive for arthralgias (of the left hip) and back pain (chronic). Negative for flank pain, gait problem and myalgias.  Skin: Negative.   Neurological:  Negative for dizziness, extremity weakness, gait problem, headaches, light-headedness, numbness, seizures and speech difficulty.  Hematological: Negative.   Psychiatric/Behavioral:  Negative for depression and sleep disturbance. The patient is nervous/anxious.   All other systems reviewed and are negative.  VITALS:  Blood pressure 133/68, pulse 65, temperature 98.3 F (36.8 C), temperature source Oral, resp. rate 18, height 5' 10.3" (1.786 m), weight 231 lb 9.6 oz (105.1 kg), SpO2 100 %.  Wt Readings from Last 3 Encounters:  01/20/22 231 lb 9.6 oz (105.1 kg)  12/23/21 231 lb 6.4 oz (105 kg)  10/28/21 242 lb 6.4 oz (110 kg)    Body mass index is 32.95 kg/m.  Performance status (ECOG): 1 - Symptomatic but completely ambulatory  PHYSICAL EXAM:  Physical Exam Constitutional:      General: He is not in acute distress.    Appearance: Normal appearance. He is normal weight.  HENT:     Head: Normocephalic and  atraumatic.  Eyes:     General: No scleral icterus.    Extraocular Movements: Extraocular movements intact.     Conjunctiva/sclera: Conjunctivae normal.     Pupils: Pupils are equal, round, and reactive to light.  Cardiovascular:     Rate and Rhythm: Normal rate and regular rhythm.     Pulses: Normal pulses.     Heart sounds: Normal heart sounds. No murmur heard.   No friction rub. No gallop.  Pulmonary:     Effort: Pulmonary effort is normal. No respiratory distress.     Breath sounds: Normal breath sounds.  Abdominal:     General: Bowel sounds are normal. There is no distension.     Palpations: Abdomen is soft. There is no hepatomegaly, splenomegaly or mass.     Tenderness: There is no abdominal tenderness.  Musculoskeletal:        General: Normal range of motion.     Cervical back: Normal range of motion and neck supple.     Right lower leg: No edema.     Left lower leg: No edema.  Lymphadenopathy:     Cervical: No cervical adenopathy.  Skin:    General: Skin is  warm and dry.  Neurological:     General: No focal deficit present.     Mental Status: He is alert and oriented to person, place, and time. Mental status is at baseline.  Psychiatric:        Mood and Affect: Mood normal.        Behavior: Behavior normal.        Thought Content: Thought content normal.        Judgment: Judgment normal.   LABS:      Latest Ref Rng & Units 01/20/2022   12:00 AM 12/23/2021   12:00 AM 11/25/2021   12:00 AM  CBC  WBC  7.1      6.5      9.5       Hemoglobin 13.5 - 17.5 14.0      14.0      14.1       Hematocrit 41 - 53 42      42      42       Platelets 150 - 400 K/uL 143      95      110          This result is from an external source.      Latest Ref Rng & Units 01/20/2022   12:00 AM 12/23/2021   12:00 AM 11/25/2021   12:00 AM  CMP  BUN 4 - 21 12      12      16        Creatinine 0.6 - 1.3 0.8      0.8      0.9       Sodium 137 - 147 145      144      141       Potassium 3.5  - 5.1 mEq/L 4.0      3.9      4.3       Chloride 99 - 108 103      102      104       CO2 13 - 22 29      31      27        Calcium 8.7 - 10.7 9.3      9.1      8.7       Alkaline Phos 25 - 125 62      59      58       AST 14 - 40 24      20      24        ALT 10 - 40 U/L 27      18      24           This result is from an external source.    STUDIES:  No results found.    HISTORY:   Allergies: No Known Allergies  Current Medications: Current Outpatient Medications  Medication Sig Dispense Refill   acetaminophen (TYLENOL) 500 MG tablet Take 500 mg by mouth every 4 (four) hours as needed for moderate pain.     albuterol (VENTOLIN HFA) 108 (90 Base) MCG/ACT inhaler Inhale 2 puffs into the lungs every 4 (four) hours as needed for wheezing or shortness of breath.     amLODipine (NORVASC) 10 MG tablet Take 10 mg by mouth daily.     Ascorbic Acid (VITAMIN C) 1000 MG tablet Take 1,000 mg by mouth daily.  benazepril (LOTENSIN) 20 MG tablet Take 1 tablet (20 mg total) by mouth daily. 90 tablet 3   CALCIUM PO Take 1 tablet by mouth daily.     escitalopram (LEXAPRO) 10 MG tablet Take 10 mg by mouth daily.     folic acid (FOLVITE) 1 MG tablet Take 1 tablet (1 mg total) by mouth daily. 90 tablet 3   furosemide (LASIX) 40 MG tablet Take 1 tablet (40 mg total) by mouth daily. 90 tablet 1   glipiZIDE (GLUCOTROL XL) 10 MG 24 hr tablet Take 10 mg by mouth daily.     metFORMIN (GLUCOPHAGE-XR) 500 MG 24 hr tablet Take 1,000 mg by mouth 2 (two) times daily.     metoprolol succinate (TOPROL-XL) 100 MG 24 hr tablet Take 100 mg by mouth daily.     NON FORMULARY Take 700 mg by mouth daily. Apple pectin      omeprazole (PRILOSEC) 20 MG capsule Take 20 mg by mouth daily.     oxyCODONE (OXY IR/ROXICODONE) 5 MG immediate release tablet Take 5 mg by mouth every 6 (six) hours as needed for pain.     potassium chloride SA (KLOR-CON M) 20 MEQ tablet Take 2 tablets (40 mEq total) by mouth 3 (three) times daily.  180 tablet 5   predniSONE (DELTASONE) 5 MG tablet Take 1 tablet (5 mg total) by mouth daily with breakfast. 90 tablet 1   pregabalin (LYRICA) 25 MG capsule Take 25-50 mg by mouth at bedtime. Have not started yet     vitamin B-12 (CYANOCOBALAMIN) 1000 MCG tablet Take 1,000 mcg by mouth daily.     vitamin k 100 MCG tablet Take 100 mcg by mouth daily.     No current facility-administered medications for this visit.     ASSESSMENT & PLAN:   Assessment:  1. Significant thrombocytopenia, which has been fluctuating up and down since June 2020.  This is chronic immune thrombocytopenic purpura.  We placed him on prednisone 20 mg three times daily in early June 2020.  We were unable to taper his steroids, so he underwent rituximab therapy weekly for 4 weeks in October 2021 with a partial response.  He is not a good candidate for splenectomy unless absolutely necessary.  His platelet count had remained above 75,000, we have steadily tapered his prednisone and prior to his hospital admission in May 2022, he was on prednisone 5mg  daily. He was admitted for COVID and placed back on high dose prednisone, with an extremely slow taper, down to 2.5 mg daily.  His platelet count worsened in August 2022, so we increased the dose back to 5 mg daily, but he has not normalized, so remains on this dose.       2. Steroid induced diabetes, for which he continues metformin 500 mg twice daily and glipizide 5 mg daily.     3.  Elevated Epstein Barr titers.  This may be indicative of chronic fatigue syndrome, and I advised that he rest as needed.  4.  Sleep apnea, untreated. I have encouraged him to use his CPAP mask for better quality of life and to reduce his daytime sleepiness.  He has been referred to  Pulmonary.   5.  Chronic anxiety, mainly related to his health.  6.  Hospital admission with acute kidney injury due to dehydration associated with COVID-19, May 2022.   He was placed on an extremely slow prednisone  taper.    7.  Significant left hip and lower back pain.  He is following with a spine specialist.  He is undergoing physical therapy. MRI of the lumbar spine revealed degenerative changes of L4-5.  8.  Elevated bilirubin which has persisted but remains stable. I suspect possible Gilbert's syndrome, but we will hold off on ordering [id number] as it would not change our plan of care.   Plan:     He will continue prednisone dose 5 mg daily.  He will continue to follow with Dr. Tomie China and the spine specialist. He will return every 6 weeks for repeat CBC. We will plan to see him back in 12 weeks with a CBC and comprehensive metabolic panel for repeat clinical assessment. The patient understands the plans discussed today and are in agreement with them. They know to contact our office if he develops concerns prior to his next appointment.  I provided 15 minutes of face-to-face time during this this encounter and > 50% was spent counseling as documented under my assessment and plan.

## 2022-01-20 ENCOUNTER — Encounter: Payer: Self-pay | Admitting: Oncology

## 2022-01-20 ENCOUNTER — Telehealth: Payer: Self-pay | Admitting: Oncology

## 2022-01-20 ENCOUNTER — Other Ambulatory Visit: Payer: Self-pay | Admitting: Oncology

## 2022-01-20 ENCOUNTER — Inpatient Hospital Stay: Payer: 59

## 2022-01-20 ENCOUNTER — Inpatient Hospital Stay: Payer: 59 | Attending: Oncology | Admitting: Oncology

## 2022-01-20 VITALS — BP 133/68 | HR 65 | Temp 98.3°F | Resp 18 | Ht 70.3 in | Wt 231.6 lb

## 2022-01-20 DIAGNOSIS — D693 Immune thrombocytopenic purpura: Secondary | ICD-10-CM

## 2022-01-20 LAB — HEPATIC FUNCTION PANEL
ALT: 27 U/L (ref 10–40)
AST: 24 (ref 14–40)
Alkaline Phosphatase: 62 (ref 25–125)
Bilirubin, Total: 1.6

## 2022-01-20 LAB — BASIC METABOLIC PANEL
BUN: 12 (ref 4–21)
CO2: 29 — AB (ref 13–22)
Chloride: 103 (ref 99–108)
Creatinine: 0.8 (ref 0.6–1.3)
Glucose: 103
Potassium: 4 mEq/L (ref 3.5–5.1)
Sodium: 145 (ref 137–147)

## 2022-01-20 LAB — CBC AND DIFFERENTIAL
HCT: 42 (ref 41–53)
Hemoglobin: 14 (ref 13.5–17.5)
Neutrophils Absolute: 4.12
Platelets: 143 10*3/uL — AB (ref 150–400)
WBC: 7.1

## 2022-01-20 LAB — COMPREHENSIVE METABOLIC PANEL
Albumin: 4.8 (ref 3.5–5.0)
Calcium: 9.3 (ref 8.7–10.7)

## 2022-01-20 LAB — CBC: RBC: 4.82 (ref 3.87–5.11)

## 2022-01-20 NOTE — Telephone Encounter (Signed)
Per 01/20/22 los next appt scheduled and confirmed with patient 

## 2022-03-06 ENCOUNTER — Inpatient Hospital Stay: Payer: 59 | Attending: Oncology

## 2022-03-06 ENCOUNTER — Other Ambulatory Visit: Payer: Self-pay | Admitting: Oncology

## 2022-03-06 ENCOUNTER — Telehealth: Payer: Self-pay

## 2022-03-06 ENCOUNTER — Other Ambulatory Visit: Payer: Self-pay | Admitting: Hematology and Oncology

## 2022-03-06 DIAGNOSIS — D693 Immune thrombocytopenic purpura: Secondary | ICD-10-CM

## 2022-03-06 DIAGNOSIS — E782 Mixed hyperlipidemia: Secondary | ICD-10-CM

## 2022-03-06 DIAGNOSIS — E876 Hypokalemia: Secondary | ICD-10-CM

## 2022-03-06 LAB — LIPID PANEL
Cholesterol: 170 mg/dL (ref 0–200)
HDL: 43 mg/dL (ref >40)
LDL Cholesterol: 107 mg/dL — ABNORMAL HIGH (ref 0–99)
Total CHOL/HDL Ratio: 4 RATIO
Triglycerides: 101 mg/dL (ref <150)
VLDL: 20 mg/dL (ref 0–40)

## 2022-03-06 LAB — COMPREHENSIVE METABOLIC PANEL
ALT: 17 U/L (ref 0–44)
AST: 15 U/L (ref 15–41)
Albumin: 4.8 g/dL (ref 3.5–5.0)
Alkaline Phosphatase: 58 U/L (ref 38–126)
Anion gap: 11 (ref 5–15)
BUN: 13 mg/dL (ref 6–20)
CO2: 29 mmol/L (ref 22–32)
Calcium: 9.5 mg/dL (ref 8.9–10.3)
Chloride: 103 mmol/L (ref 98–111)
Creatinine, Ser: 0.91 mg/dL (ref 0.61–1.24)
GFR, Estimated: >60 mL/min (ref >60)
Glucose, Bld: 96 mg/dL (ref 70–99)
Potassium: 4.3 mmol/L (ref 3.5–5.1)
Sodium: 143 mmol/L (ref 135–145)
Total Bilirubin: 1.3 mg/dL — ABNORMAL HIGH (ref 0.3–1.2)
Total Protein: 7.5 g/dL (ref 6.5–8.1)

## 2022-03-06 LAB — CBC AND DIFFERENTIAL
HCT: 43 (ref 41–53)
Hemoglobin: 14.6 (ref 13.5–17.5)
Neutrophils Absolute: 6.32
Platelets: 90 10*3/uL — AB (ref 150–400)
WBC: 9.3

## 2022-03-06 LAB — CBC: RBC: 4.95 (ref 3.87–5.11)

## 2022-03-06 NOTE — Telephone Encounter (Signed)
Called pt at his request to let him know his lab results.

## 2022-03-07 ENCOUNTER — Telehealth: Payer: Self-pay

## 2022-03-07 NOTE — Telephone Encounter (Signed)
Patient called and notified.

## 2022-03-07 NOTE — Telephone Encounter (Signed)
-----   Message from Dellia Beckwith, MD sent at 03/07/2022  2:19 PM EDT ----- Regarding: FW: call re labs Can tell him chemistries look good, chol 170 and lipid panel good. Would you get A1c from [facility] and let him know that also? ----- Message ----- From: London Pepper D Sent: 03/06/2022   1:14 PM EDT To: Dellia Beckwith, MD; Jeannette Corpus, LPN Subject: RE: call re labs                               I done the A1C this morning, it is in med. I will send the Lipid and CMP to [facility], thank you!  ----- Message ----- From: Dellia Beckwith, MD Sent: 03/06/2022  12:46 PM EDT To: Jeannette Corpus, LPN; Hilliard Clark Subject: call re labs                                   So they wanted add'l labs and he just had it drawn this am.  Check if Danella Deis has enough tubes for CMP, A1c and lipid panel, and I will put in order. Tell him platelets back down to 90,000, or she may have already told him this am

## 2022-03-26 ENCOUNTER — Other Ambulatory Visit: Payer: Self-pay | Admitting: Cardiology

## 2022-04-14 ENCOUNTER — Encounter: Payer: Self-pay | Admitting: Oncology

## 2022-04-23 NOTE — Progress Notes (Signed)
[facility]  [address] [place],  [place]  [address] [phone]  Clinic Day:  04/24/22  Referring physician: Julianne Handler, NP  CHIEF COMPLAINT:  CC:  Immune thrombocytopenic purpura, chronic  Current Treatment:   Prednisone 5 mg daily  HISTORY OF PRESENT ILLNESS:  Mike [name] is a 51 y.o. male with a history of severe thrombocytopenia, felt to represent immune thrombocytopenic purpura.  He has had thrombocytopenia, which has fluctuated up and down since June 2020.  Further laboratory evaluation did not reveal any other specific etiology for his thrombocytopenia.  He had mildly elevated liver transaminases.  Abdominal ultrasound revealed increased echogenicity of hepatic parenchyma suggesting hepatic steatosis, with probable fatty sparing adjacent to gallbladder fossa.  No other abnormality was seen.  We recommended bone marrow examination, but the patient and his mother declined.  The platelet count continued to fluctuate up and down, but as his platelet count remained 25,000 in June 2021, he was started on prednisone 20 mg 3 times daily, with a good response.  He has had leukocytosis secondary to the high-dose prednisone.  He also has had oral candidiasis on occasion. Malachi Carl titers were elevated, so this may be representative of chronic fatigue syndrome. We decreased his prednisone to 45 mg in late September and platelets remained stable at 97,000.  Since we were unable to taper him off the prednisone he was treated with rituximab weekly for 4 weeks in October and November 2021.  We have been slowly tapering the prednisone.  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  He has also had hypokalemia resolved with oral supplementation.    On May 18th, his platelet count was 151,000, so I decreased his prednisone to 5 mg daily. He was admitted with COVID-19 on May 24th after leaving work earlier due  to altered mental status and generalized weakness. He had acute kidney injury with a creatinine of 2.2, which normalized with IV hydration. He had fever, but no respiratory symptoms. He was discharged on a prolonged prednisone taper over 15 weeks.  He had been having some chest pain and dyspnea since discontinuing indapamide, so was referred to Dr. Tomie China in Cardiology. He placed him on furosemide 40 mg daily and metoprolol 50 mg daily.  He had had problems with hypokalemia for a long time but this worsened with the diuretic.  He also had him increase his potassium to 40 mg twice daily.  He states his lower extremity edema has resolved with the furosemide.  The ECHO shows left ventricular hypertrophy with an EF between 55-60%, and the CT of the coronary arteries looks good.  He is currently on gabapentin 300 mg BID.  He was on a extremely slow taper of the prednisone, but his platelets dropped to 73,000 when the dose was dropped to 5 mg three times per week.  I increased it back to 5 mg daily and stopped the gabapentin as it wasn't helping.  MRI of the lumbar spine from October 27th revealed degenerative changes of the lumbar spine most prominent L4-5.  INTERVAL HISTORY:  Oziah is here for routine follow up and states that he is still having issues with sciatica.  This was so severe that he was bed ridden for 1 month.  He continues prednisone 5 mg daily. He continues to have pain of the back and arthralgias of the left hip, and is following with the spine specialist.He is feeling very stressed all  the time with his employment and insurance issues, and says "this is driving me insane".  He rates his pain as a 5/10 today.He became tearful and is unable to sit or stand for any length of time. The Lexapro is increased to 20 mg daily. Platelets have decreased from 143,000 to 90,000 and now 89,000, and white count and hemoglobin remain normal. Chemistries are unremarkable except for a BUN of 24. His  appetite is  fair, and he has lost another 6 pounds since his last visit.  He denies fever, chills or other signs of infection.  He denies nausea, vomiting, bowel issues, or abdominal pain.  He denies sore throat, cough, dyspnea, or chest pain.   REVIEW OF SYSTEMS:  Review of Systems  Constitutional: Negative.  Negative for appetite change, chills, fatigue, fever and unexpected weight change.  HENT:  Negative.    Eyes: Negative.   Respiratory: Negative.  Negative for chest tightness, cough, hemoptysis, shortness of breath and wheezing.   Cardiovascular: Negative.  Negative for chest pain, leg swelling and palpitations.  Gastrointestinal: Negative.  Negative for abdominal distention, abdominal pain, blood in stool, constipation, diarrhea, nausea and vomiting.  Endocrine: Negative.   Genitourinary: Negative.  Negative for difficulty urinating, dysuria, frequency and hematuria.   Musculoskeletal:  Positive for arthralgias (of the left hip) and back pain (chronic). Negative for flank pain, gait problem and myalgias.  Skin: Negative.   Neurological:  Negative for dizziness, extremity weakness, gait problem, headaches, light-headedness, numbness, seizures and speech difficulty.  Hematological: Negative.   Psychiatric/Behavioral:  Negative for depression and sleep disturbance. The patient is nervous/anxious.   All other systems reviewed and are negative.   VITALS:  Blood pressure (!) 149/65, pulse (!) 52, temperature 98.2 F (36.8 C), temperature source Oral, resp. rate 16, height 5' 10.3" (1.786 m), weight 224 lb 14.4 oz (102 kg), SpO2 99 %.  Wt Readings from Last 3 Encounters:  04/24/22 224 lb 14.4 oz (102 kg)  01/20/22 231 lb 9.6 oz (105.1 kg)  12/23/21 231 lb 6.4 oz (105 kg)    Body mass index is 31.99 kg/m.  Performance status (ECOG): 1 - Symptomatic but completely ambulatory  PHYSICAL EXAM:  Physical Exam Constitutional:      General: He is not in acute distress.    Appearance: Normal  appearance. He is normal weight.  HENT:     Head: Normocephalic and atraumatic.  Eyes:     General: No scleral icterus.    Extraocular Movements: Extraocular movements intact.     Conjunctiva/sclera: Conjunctivae normal.     Pupils: Pupils are equal, round, and reactive to light.  Cardiovascular:     Rate and Rhythm: Normal rate and regular rhythm.     Pulses: Normal pulses.     Heart sounds: Normal heart sounds. No murmur heard.    No friction rub. No gallop.  Pulmonary:     Effort: Pulmonary effort is normal. No respiratory distress.     Breath sounds: Normal breath sounds.  Abdominal:     General: Bowel sounds are normal. There is no distension.     Palpations: Abdomen is soft. There is no hepatomegaly, splenomegaly or mass.     Tenderness: There is no abdominal tenderness.  Musculoskeletal:        General: Normal range of motion.     Cervical back: Normal range of motion and neck supple.     Right lower leg: No edema.     Left lower leg: No edema.  Lymphadenopathy:     Cervical: No cervical adenopathy.  Skin:    General: Skin is warm and dry.  Neurological:     General: No focal deficit present.     Mental Status: He is alert and oriented to person, place, and time. Mental status is at baseline.  Psychiatric:        Mood and Affect: Mood normal.        Behavior: Behavior normal.        Thought Content: Thought content normal.        Judgment: Judgment normal.    LABS:      Latest Ref Rng & Units 04/24/2022   12:00 AM 03/06/2022   12:00 AM 01/20/2022   12:00 AM  CBC  WBC  8.8     9.3     7.1      Hemoglobin 13.5 - 17.5 14.3     14.6     14.0      Hematocrit 41 - 53 42     43     42      Platelets 150 - 400 K/uL 89     90     143         This result is from an external source.      Latest Ref Rng & Units 04/24/2022   12:00 AM 03/06/2022    2:14 PM 01/20/2022   12:00 AM  CMP  Glucose 70 - 99 mg/dL  96    BUN 4 - 21 24     13  12       Creatinine 0.6 - 1.3 0.9      0.91  0.8      Sodium 137 - 147 138     143  145      Potassium 3.5 - 5.1 mEq/L 4.1     4.3  4.0      Chloride 99 - 108 101     103  103      CO2 13 - 22 30     29  29       Calcium 8.7 - 10.7 9.7     9.5  9.3      Total Protein 6.5 - 8.1 g/dL  7.5    Total Bilirubin 0.3 - 1.2 mg/dL  1.3    Alkaline Phos 25 - 125 61     58  62      AST 14 - 40 28     15  24       ALT 10 - 40 U/L 26     17  27          This result is from an external source.    STUDIES:  No results found.    HISTORY:   Allergies: No Known Allergies  Current Medications: Current Outpatient Medications  Medication Sig Dispense Refill   acetaminophen (TYLENOL) 500 MG tablet Take 500 mg by mouth every 4 (four) hours as needed for moderate pain.     albuterol (VENTOLIN HFA) 108 (90 Base) MCG/ACT inhaler Inhale 2 puffs into the lungs every 4 (four) hours as needed for wheezing or shortness of breath.     amLODipine (NORVASC) 10 MG tablet Take 10 mg by mouth daily.     Ascorbic Acid (VITAMIN C) 1000 MG tablet Take 1,000 mg by mouth daily.     benazepril (LOTENSIN) 20 MG tablet Take 1 tablet (20 mg total) by mouth daily. 90 tablet 3   CALCIUM  PO Take 1 tablet by mouth daily.     escitalopram (LEXAPRO) 20 MG tablet Take 20 mg by mouth daily.     folic acid (FOLVITE) 1 MG tablet Take 1 tablet (1 mg total) by mouth daily. 90 tablet 3   furosemide (LASIX) 40 MG tablet Take 1 tablet (40 mg total) by mouth daily. 90 tablet 1   glipiZIDE (GLUCOTROL XL) 10 MG 24 hr tablet Take 10 mg by mouth daily.     metFORMIN (GLUCOPHAGE-XR) 500 MG 24 hr tablet Take 1,000 mg by mouth 2 (two) times daily.     metoprolol succinate (TOPROL-XL) 100 MG 24 hr tablet Take 1 tablet (100 mg total) by mouth daily. Take with or immediately following a meal. 90 tablet 2   NON FORMULARY Take 700 mg by mouth daily. Apple pectin      omeprazole (PRILOSEC) 20 MG capsule Take 20 mg by mouth daily.     oxyCODONE (OXY IR/ROXICODONE) 5 MG immediate release tablet  Take 5 mg by mouth every 6 (six) hours as needed for pain.     potassium chloride SA (KLOR-CON M) 20 MEQ tablet Take 2 tablets (40 mEq total) by mouth 3 (three) times daily. 180 tablet 5   predniSONE (DELTASONE) 5 MG tablet Take 1 tablet (5 mg total) by mouth daily with breakfast. 90 tablet 1   pregabalin (LYRICA) 25 MG capsule Take 25-50 mg by mouth at bedtime. Have not started yet     vitamin B-12 (CYANOCOBALAMIN) 1000 MCG tablet Take 1,000 mcg by mouth daily.     vitamin k 100 MCG tablet Take 100 mcg by mouth daily.     No current facility-administered medications for this visit.     ASSESSMENT & PLAN:   Assessment:  1. Significant thrombocytopenia, which has been fluctuating up and down since June 2020.  This is chronic immune thrombocytopenic purpura.  We placed him on prednisone 20 mg three times daily in early June 2020.  We were unable to taper his steroids, so he underwent rituximab therapy weekly for 4 weeks in October 2021 with a partial response.  He is not a good candidate for splenectomy unless absolutely necessary.  His platelet count had remained above 75,000, we have steadily tapered his prednisone and prior to his hospital admission in May 2022, he was on prednisone 5mg  daily. He was admitted for COVID and placed back on high dose prednisone, with an extremely slow taper, down to 2.5 mg daily.  His platelet count worsened in August 2022, so we increased the dose back to 5 mg daily, but he has not normalized, so remains on this dose.       2. Steroid induced diabetes, for which he continues metformin 500 mg twice daily and glipizide 5 mg daily.     3.  Elevated Epstein Barr titers.  This may be indicative of chronic fatigue syndrome, and I advised that he rest as needed.  4.  Sleep apnea, untreated. I have encouraged him to use his CPAP mask for better quality of life and to reduce his daytime sleepiness.  He has been referred to  Pulmonary.   5.  Chronic anxiety, mainly  related to his health, and also to his job and insurance issues.  6.  Hospital admission with acute kidney injury due to dehydration associated with COVID-19, May 2022.   He was placed on an extremely slow prednisone taper.    7.  Significant left hip and lower back pain.  He  is following with a spine specialist.  He is undergoing physical therapy. MRI of the lumbar spine revealed degenerative changes of L4-5. He is unable to have any invasive procedure with his persistent thrombocytopenia.  8.  Elevated bilirubin which has persisted but remains stable. I suspect possible Gilbert's syndrome, but we will hold off on ordering [id number] as it would not change our plan of care.   Plan:     He will continue prednisone dose 5 mg daily.  He will continue to follow with Dr. Tomie Chinaevankar and the spine specialist. He will return every 6 weeks for repeat CBC. We will plan to see him back in 12 weeks with a CBC and comprehensive metabolic panel for repeat clinical assessment. The patient understands the plans discussed today and are in agreement with them. They know to contact our office if he develops concerns prior to his next appointment.  I provided 15 minutes of face-to-face time during this this encounter and > 50% was spent counseling as documented under my assessment and plan.

## 2022-04-24 ENCOUNTER — Other Ambulatory Visit: Payer: Self-pay | Admitting: Oncology

## 2022-04-24 ENCOUNTER — Encounter: Payer: Self-pay | Admitting: Oncology

## 2022-04-24 ENCOUNTER — Other Ambulatory Visit: Payer: Self-pay | Admitting: Cardiology

## 2022-04-24 ENCOUNTER — Inpatient Hospital Stay: Payer: 59

## 2022-04-24 ENCOUNTER — Inpatient Hospital Stay: Payer: 59 | Attending: Oncology | Admitting: Oncology

## 2022-04-24 VITALS — BP 149/65 | HR 52 | Temp 98.2°F | Resp 16 | Ht 70.3 in | Wt 224.9 lb

## 2022-04-24 DIAGNOSIS — D693 Immune thrombocytopenic purpura: Secondary | ICD-10-CM | POA: Insufficient documentation

## 2022-04-24 DIAGNOSIS — I10 Essential (primary) hypertension: Secondary | ICD-10-CM

## 2022-04-24 LAB — BASIC METABOLIC PANEL
BUN: 24 — AB (ref 4–21)
CO2: 30 — AB (ref 13–22)
Chloride: 101 (ref 99–108)
Creatinine: 0.9 (ref 0.6–1.3)
Glucose: 125
Potassium: 4.1 mEq/L (ref 3.5–5.1)
Sodium: 138 (ref 137–147)

## 2022-04-24 LAB — CBC AND DIFFERENTIAL
HCT: 42 (ref 41–53)
Hemoglobin: 14.3 (ref 13.5–17.5)
Neutrophils Absolute: 5.28
Platelets: 89 10*3/uL — AB (ref 150–400)
WBC: 8.8

## 2022-04-24 LAB — HEPATIC FUNCTION PANEL
ALT: 26 U/L (ref 10–40)
AST: 28 (ref 14–40)
Alkaline Phosphatase: 61 (ref 25–125)
Bilirubin, Total: 1

## 2022-04-24 LAB — CBC: RBC: 4.78 (ref 3.87–5.11)

## 2022-04-24 LAB — HEMOGLOBIN A1C
Hgb A1c MFr Bld: 4.8 % (ref 4.8–5.6)
Mean Plasma Glucose: 91.06 mg/dL

## 2022-04-24 LAB — COMPREHENSIVE METABOLIC PANEL
Albumin: 4.6 (ref 3.5–5.0)
Calcium: 9.7 (ref 8.7–10.7)

## 2022-04-25 ENCOUNTER — Encounter: Payer: Self-pay | Admitting: Licensed Clinical Social Worker

## 2022-04-25 NOTE — Progress Notes (Signed)
[facility] Clinical Social Work  Clinical Social Work was referred by medical provider for assessment of psychosocial needs.  Clinical Social Worker attempted to contact patient by phone  to offer support and assess for needs.  CSW left voicemail with contact information and request for a return call.   FA  Natayah Warmack, LCSW  Clinical Social Worker [facility]          

## 2022-04-29 ENCOUNTER — Inpatient Hospital Stay: Payer: 59 | Admitting: Licensed Clinical Social Worker

## 2022-04-29 DIAGNOSIS — D693 Immune thrombocytopenic purpura: Secondary | ICD-10-CM

## 2022-04-29 NOTE — Progress Notes (Signed)
[facility] Clinical Social Work  Initial Assessment   Ajax Schroll is a 51 y.o. year old male contacted caregiver by phone. Clinical Social Work was referred by medical provider for assessment of psychosocial needs.   SDOH (Social Determinants of Health) assessments performed: Yes SDOH Interventions    Flowsheet Row Most Recent Value  SDOH Interventions   Food Insecurity Interventions Intervention Not Indicated  Financial Strain Interventions Intervention Not Indicated  Housing Interventions Intervention Not Indicated  Physical Activity Interventions Intervention Not Indicated  Stress Interventions Provide Counseling  Social Connections Interventions Intervention Not Indicated  Transportation Interventions Patient Resources (Friends/Family)  Depression Interventions/Treatment  Counseling       SDOH Screenings   Alcohol Screen: Low Risk  (04/29/2022)   Alcohol Screen    Last Alcohol Screening Score (AUDIT): 0  Depression (PHQ2-9): Medium Risk (04/29/2022)   Depression (PHQ2-9)    PHQ-2 Score: 6  Financial Resource Strain: Medium Risk (04/29/2022)   Overall Financial Resource Strain (CARDIA)    Difficulty of Paying Living Expenses: Somewhat hard  Food Insecurity: No Food Insecurity (04/29/2022)   Hunger Vital Sign    Worried About Running Out of Food in the Last Year: Never true    Ran Out of Food in the Last Year: Never true  Housing: Low Risk  (04/29/2022)   Housing    Last Housing Risk Score: 0  Physical Activity: Inactive (04/29/2022)   Exercise Vital Sign    Days of Exercise per Week: 0 days    Minutes of Exercise per Session: 0 min  Social Connections: Moderately Integrated (04/29/2022)   Social Connection and Isolation Panel [NHANES]    Frequency of Communication with Friends and Family: Three times a week    Frequency of Social Gatherings with Friends and Family: Three times a week    Attends Religious Services: 1 to 4 times per year    Active Member of Clubs or  Organizations: No    Attends Banker Meetings: 1 to 4 times per year    Marital Status: Divorced  Stress: Stress Concern Present (04/29/2022)   [employer] of Occupational Health - Occupational Stress Questionnaire    Feeling of Stress : To some extent  Tobacco Use: Medium Risk (04/24/2022)   Patient History    Smoking Tobacco Use: Never    Smokeless Tobacco Use: Former    Passive Exposure: Not on file  Transportation Needs: No Transportation Needs (04/29/2022)   PRAPARE - Transportation    Lack of Transportation (Medical): No    Lack of Transportation (Non-Medical): No     Distress Screen completed: No    03/21/2021    9:08 AM  ONCBCN DISTRESS SCREENING  Screening Type Initial Screening  Distress experienced in past week (1-10) 0      Family/Social Information Housing Arrangement: patient lives alone sister, Corene Cornea is HCPOA/POA [phone] Family members/support persons in your life? Family, Medical Staff, and Professional counseling Transportation concerns: no  Employment: Disabled , patient is currently receiving disability through [employer] at  work.  Patient has applied for Social Security disability, has retained an attorney to assist with application process.  Patient's sister stated the attorney told her it could take 12-18 months before social security approved the application. Income source: Long-Term Disability Financial concerns:  Patient stated there is general financial concerns, patient's current gross income prevents patient meeting criteria for Medicaid. Type of concern:  general financial concerns Food access concerns: no Religious or spiritual practice: Not known Services Currently in  place:  [employer] via [employer]  Coping/ Adjustment to diagnosis: Patient understands treatment plan and what happens next? yes Concerns about diagnosis and/or treatment: Overwhelmed by information and How I will pay for the services I need Patient reported  stressors: Finances, Depression, Anxiety/ nervousness, Adjusting to my illness, and Isolation/ feeling alone Hopes and/or priorities: N/A Patient enjoys time with family/ friends Current coping skills/ strengths: Contractor , General fund of knowledge , and Supportive family/friends     SUMMARY: Current SDOH Barriers:  Financial constraints related to fixed income.  Clinical Social Work Clinical Goal(s):  Patient will work with SW to address concerns related to anxiety and depression.  Interventions: Discussed common feeling and emotions when being diagnosed with cancer, and the importance of support during treatment Informed patient of the support team roles and support services at [facility] Provided CSW contact information and encouraged patient to call with any questions or concerns Provided patient with information about CSW role inpatient care and available resources.   Follow Up Plan: Patient will contact CSW with any support or resource needs and CSW will see patient on 05/07/2022 @ 9:30AM. Patient verbalizes understanding of plan: Yes    Joseph Art, LCSW

## 2022-05-07 ENCOUNTER — Encounter: Payer: Self-pay | Admitting: Licensed Clinical Social Worker

## 2022-05-07 ENCOUNTER — Inpatient Hospital Stay: Payer: 59 | Attending: Oncology | Admitting: Licensed Clinical Social Worker

## 2022-05-07 DIAGNOSIS — D693 Immune thrombocytopenic purpura: Secondary | ICD-10-CM

## 2022-05-07 NOTE — Progress Notes (Signed)
[facility] CSW Counseling Note  Patient was referred by medical provider. Treatment type: Family  Presenting Concerns: Patient and/or family reports the following symptoms/concerns: anxiety, depression, stress, and adjustment concerns to diagnosis Duration of problem: 1 years; Severity of problem: moderate   Orientation:oriented to person, place, time/date, situation, day of week, month of year, and year.   Affect: Depressed and Flat Risk of harm to self or others: No plan to harm self or others  Patient and/or Family's Strengths/Protective Factors: Social connections, Concrete supports in place (healthy food, safe environments, etc.), Physical Health (exercise, healthy diet, medication compliance, etc.), and supportive familyActive sense of humor  Average or above average intelligence  Capable of independent living  Financial means  Motivation for treatment/growth  Supportive family/friends  Work skills      Goals Addressed: Patient will:  Reduce symptoms of: anxiety, depression, stress, and adjustment concerns, communication on wants and needs Increase knowledge and/or ability of: coping skills, healthy habits, self-management skills, stress reduction, and boundaries   Increase healthy adjustment to current life circumstances, Increase adequate support systems for patient/family, Begin healthy grieving over loss, and increase acceptance economic changes due to loss of work   Progress towards Goals: Initial   Interventions: Interventions utilized:  CBT, Strength-based, Supportive, Family Systems, Reframing, and Other: ACT       Assessment: Patient currently experiencing moderate to severe symptoms of anxiety and depression due to recent diagnosis of thombrocytopenia, loss of employment and major changes in his financial status.  Patient's sister [name], has been assisting him during this process and has taken over the administrative aspects, and was actively participating during the  session. Patient did not speak much, but did confirm there was no active S/I or plan.  Patient expressed concerns about current financial status and future financial well-bring if he is does not meet criteria for long-tern disability.  Patient also expressed concerns about having enough money to live on if he received social security disability.  CSW recommended to the patient and his sister to discuss the different financial options depending on what could happen. CSW observed patient's sister is very angry about the process for the current short-disability from work, CSW spoke with her about using different techniques to address concerns and emotional responses to help manage stress responses.  CSW also spoke with the patient about different ways to decrease anxiety and symptoms of depression.      Plan: Follow up with CSW: 1 week 05/04/2022 @ 9:30AM Behavioral recommendations: practice mindfulness living and awareness of thought-emotion and action (CBT) connection Referral(s): Other medical:  N/A         Joseph Art, LCSW

## 2022-05-08 ENCOUNTER — Encounter: Payer: Self-pay | Admitting: Licensed Clinical Social Worker

## 2022-05-14 ENCOUNTER — Inpatient Hospital Stay: Payer: 59 | Admitting: Licensed Clinical Social Worker

## 2022-05-14 DIAGNOSIS — D696 Thrombocytopenia, unspecified: Secondary | ICD-10-CM

## 2022-05-14 DIAGNOSIS — D693 Immune thrombocytopenic purpura: Secondary | ICD-10-CM

## 2022-05-14 NOTE — Progress Notes (Signed)
[facility] CSW Counseling Note  Patient was referred by medical provider. Treatment type: Family  Presenting Concerns: Patient and/or family reports the following symptoms/concerns: anxiety, depression, stress, and adjustment concerns due to diagnosis and life changes Duration of problem:  1 years; Severity of problem: moderate   Orientation:oriented to person, place, time/date, situation, day of week, month of year, and year.   Affect: Depressed and Flat Risk of harm to self or others: No plan to harm self or others  Patient and/or Family's Strengths/Protective Factors: Social connections, Concrete supports in place (healthy food, safe environments, etc.), Physical Health (exercise, healthy diet, medication compliance, etc.), Caregiver has knowledge of parenting & child development, and supportive familyAbility for insight  Active sense of humor  Average or above average intelligence  Capable of independent living  Personnel officer means  Physical Health  Supportive family/friends  Work skills      Goals Addressed: Patient will:  Reduce symptoms of: anxiety, depression, stress, and adjustment concerns Increase knowledge and/or ability of: coping skills, healthy habits, self-management skills, stress reduction, and communication and boundary setting   Increase healthy adjustment to current life circumstances, Increase motivation to adhere to plan of care, Begin healthy grieving over loss, and increase physical activity and use of therapeutic coping skills   Progress towards Goals: Progressing   Interventions: Interventions utilized:  CBT, Strength-based, Supportive, Family Systems, Reframing, and Other: ACT, mindfulness       Assessment: Patient currently experiencing moderate to severe symptoms of anxiety and depression. Patient, sister/primary caregiver and CSW discussed used different coping skills to help with increased anxiety and panic when he is in a place with  a lot of people.  Discussed ways to be more physically active and less isolated. Patient actively participated during session and shared more of his concerns.  Patient's sister was less active and continued to input information which was helpful.       Plan: Follow up with CSW: 1 week Behavioral recommendations:  practice mindfulness living and awareness of thought-emotion and action (CBT) connection, use Referral(s): Other medical:  speak with PCP about SSRI medication and anxiety medications       Mike Bartoszek, LCSW

## 2022-05-21 ENCOUNTER — Inpatient Hospital Stay: Payer: 59 | Admitting: Licensed Clinical Social Worker

## 2022-05-21 DIAGNOSIS — D693 Immune thrombocytopenic purpura: Secondary | ICD-10-CM

## 2022-05-21 NOTE — Progress Notes (Signed)
[facility] CSW Counseling Note  Patient was referred by medical provider. Treatment type: Individual  Presenting Concerns: Patient and/or family reports the following symptoms/concerns: anxiety, depression, stress, and Adjustment to diagnosis and life changes Duration of problem: 1 years; Severity of problem: moderate   Orientation:oriented to person, place, time/date, situation, day of week, month of year, and year.   Affect: Flat and Depressed Risk of harm to self or others: No plan to harm self or others  Patient and/or Family's Strengths/Protective Factors: Social connections, Social and Emotional competence, Sense of purpose, and Physical Health (exercise, healthy diet, medication compliance, etc.)Ability for insight  Active sense of humor  Average or above average intelligence  Capable of independent living  Communication skills  General fund of knowledge  Motivation for treatment/growth  Physical Health  Special hobby/interest  Supportive family/friends  Work skills      Goals Addressed: Patient will:  Reduce symptoms of: anxiety, depression, stress, and adjustment concerns Increase knowledge and/or ability of: coping skills, healthy habits, self-management skills, stress reduction, and communication and boundary setting   Increase healthy adjustment to current life circumstances, Begin healthy grieving over loss, and increase communication and boundary setting skills   Progress towards Goals: Progressing   Interventions: Interventions utilized:  CBT, Solution Focused, Strength-based, Supportive, Reframing, and Other: ACT       Assessment: Patient currently experiencing continues to experience moderate symptoms of anxiety and depression and well as difficulty with adjustment to diagnosis and life circumstance. Patient increasingly engaged in sessions and is sharing more information about concerns and fears since diagnosis. Patient, sister/primary caregiver and CSW discussed  used different coping skills to help increase social engagement, going to crowded places and driving.  CSW and patient discussed different ways to increase emotional and physical well-bring when he is driving and dicussed possibly getting an emotional support animal, for companionship and support.  Patient and CSW discussed different scenarios and how to safely manage emotional responses when in public, driving and on his own.      Plan: Follow up with CSW: 1 week Behavioral recommendations: continue using skills, open communication and boundary setting, driving on his own and running errands for family Referral(s): Other medical:  PCP  - CSW recommended patient discuss safe exercises to increase core and lowe back strength, with his abdominal hernia.       Adelene Amas, LCSW

## 2022-05-28 ENCOUNTER — Inpatient Hospital Stay: Payer: 59 | Admitting: Licensed Clinical Social Worker

## 2022-05-28 DIAGNOSIS — D693 Immune thrombocytopenic purpura: Secondary | ICD-10-CM

## 2022-05-28 NOTE — Progress Notes (Signed)
[facility] CSW Counseling Note  Patient was referred by medical provider. Treatment type: Individual  Presenting Concerns: Patient and/or family reports the following symptoms/concerns: anxiety, depression, and stress and adjustment to diagnosis and life changes Duration of problem: 1 years; Severity of problem: moderate   Orientation:oriented to person, place, time/date, situation, day of week, month of year, and year.   Affect: Appropriate Risk of harm to self or others: No plan to harm self or others  Patient and/or Family's Strengths/Protective Factors: Social connections, Concrete supports in place (healthy food, safe environments, etc.), and Physical Health (exercise, healthy diet, medication compliance, etc.)Ability for insight  Active sense of humor  Average or above average intelligence  Capable of independent living  Communication skills  General fund of knowledge  Motivation for treatment/growth  Physical Health  Special hobby/interest  Supportive family/friends  Work skills      Goals Addressed: Patient will:  Reduce symptoms of: anxiety, depression, stress, and adjustment Increase knowledge and/or ability of: coping skills, healthy habits, self-management skills, and stress reduction  Increase healthy adjustment to current life circumstances and Begin healthy grieving over loss   Progress towards Goals: Progressing   Interventions: Interventions utilized:  CBT, Solution Focused, Strength-based, Supportive, Family Systems, and Other: ACT       Assessment: Patient currently experiencing symptoms of anxiety, depression and stress.  Patient is more perceptive to receiving skills and increasingly open and active in session.  Substantial progress was made this session with identifying triggers which increase anxiety in public spaces.      Plan: Follow up with CSW: 1 week Behavioral recommendations: continue using skills to assist with anxiety and depression, continue  going to store and driving, continue engaging with family for support Referral(s): Support group(s):  N/A         Adelene Amas, LCSW

## 2022-06-04 ENCOUNTER — Inpatient Hospital Stay: Payer: 59 | Attending: Oncology | Admitting: Licensed Clinical Social Worker

## 2022-06-04 DIAGNOSIS — D693 Immune thrombocytopenic purpura: Secondary | ICD-10-CM

## 2022-06-04 NOTE — Progress Notes (Signed)
[facility] CSW Counseling Note  Patient was referred by medical provider. Treatment type: Individual  Presenting Concerns: Patient and/or family reports the following symptoms/concerns: anxiety, depression, and stress and adjustment to diagnosis and life changes Duration of problem: 1 years; Severity of problem: moderate   Orientation:oriented to person, place, time/date, situation, day of week, month of year, and year.   Affect: Appropriate Risk of harm to self or others: No plan to harm self or others  Patient and/or Family's Strengths/Protective Factors: Social connections, Concrete supports in place (healthy food, safe environments, etc.), and Physical Health (exercise, healthy diet, medication compliance, etc.)  Ability for insight  Active sense of humor  Average or above average intelligence  Capable of independent living  Communication skills  Motivation for treatment/growth  Physical Health  Special hobby/interest  Supportive family/friends  Work skills    Goals Addressed: Patient will:  Reduce symptoms of: anxiety, depression, stress, and adjustment difficulties to diagnosis and treatment  Increase knowledge and/or ability of: coping skills, healthy habits, stress reduction, and boundary setting and communication   Increase healthy adjustment to current life circumstances and Increase motivation to adhere to plan of care   Progress towards Goals: Progressing   Interventions: Interventions utilized:  CBT, Motivational Interviewing, Solution Focused, Strength-based, Family Systems, and Other: ACT       Assessment: Patient currently experiencing anxiety, depression and stress.  Patient is more perceptive to receiving skills and increasingly open and active in session.  Substantial progress was made this session with identifying triggers which increase anxiety in public spaces. Discussed different ways to improve communication family members and skills for managing anxiety.        Plan: Follow up with CSW: 1 week Behavioral recommendations: continue using skills to assist with anxiety and depression, continue going to store and driving, continue engaging with family for support Referral(s): Support group(s):  N/A         Mike Amas, LCSW

## 2022-06-11 ENCOUNTER — Inpatient Hospital Stay: Payer: 59 | Admitting: Licensed Clinical Social Worker

## 2022-06-11 DIAGNOSIS — D693 Immune thrombocytopenic purpura: Secondary | ICD-10-CM

## 2022-06-11 NOTE — Progress Notes (Signed)
[facility] CSW Counseling Note  Patient was referred by medical provider. Treatment type: Individual  Presenting Concerns: Patient and/or family reports the following symptoms/concerns: anxiety, depression, stress, and adjustment to diagnosis and life changes Duration of problem: 1  years; Severity of problem: moderate   Orientation:oriented to person, place, time/date, situation, day of week, month of year, and year.   Affect: Appropriate Risk of harm to self or others: No plan to harm self or others  Patient and/or Family's Strengths/Protective Factors: Social connections, Social and Emotional competence, Concrete supports in place (healthy food, safe environments, etc.), Sense of purpose, Physical Health (exercise, healthy diet, medication compliance, etc.), Caregiver has knowledge of parenting & child development, and Parental ResilienceAbility for insight  Active sense of humor  Average or above average intelligence  Capable of independent living  Communication skills  General fund of knowledge  Motivation for treatment/growth  Special hobby/interest  Supportive family/friends  Work skills      Goals Addressed: Patient will:  Reduce symptoms of: anxiety, depression, stress, and adjustment difficulties to diagnosis and treatment Increase knowledge and/or ability of: coping skills, healthy habits, self-management skills, stress reduction, and mindfulness, communication and boundary setting   Increase healthy adjustment to current life circumstances, Increase motivation to adhere to plan of care, Begin healthy grieving over loss, and relationship building   Progress towards Goals: Progressing   Interventions: Interventions utilized:  CBT, Solution Focused, Strength-based, Supportive, Family Systems, Reframing, and Other: ACT       Assessment: Patient currently experiencing anxiety, depression and stress.  Patient is more perceptive to receiving skills and increasingly open and  active in session.  Continued progress was made this session with identifying triggers which increase anxiety in public spaces. Discussed different ways to improve communication family members and skills for managing anxiety.  .      Plan: Follow up with CSW: 1 week Behavioral recommendations: continue using skills to assist with anxiety and depression, continue going to store and driving, continue engaging with family for support Referral(s): Support group(s):  N/A  - Recommended patient speak with PCP about chronic lower back pain which in interfering with ADLs.       Mike Amas, LCSW

## 2022-06-16 ENCOUNTER — Inpatient Hospital Stay: Payer: 59

## 2022-06-16 DIAGNOSIS — D693 Immune thrombocytopenic purpura: Secondary | ICD-10-CM

## 2022-06-16 LAB — CBC AND DIFFERENTIAL
HCT: 45 (ref 41–53)
Hemoglobin: 15.4 (ref 13.5–17.5)
Neutrophils Absolute: 5.37
Platelets: 88 10*3/uL — AB (ref 150–400)
WBC: 8.8

## 2022-06-16 LAB — CBC: RBC: 5.12 — AB (ref 3.87–5.11)

## 2022-06-18 ENCOUNTER — Inpatient Hospital Stay: Payer: 59 | Admitting: Licensed Clinical Social Worker

## 2022-06-18 DIAGNOSIS — D693 Immune thrombocytopenic purpura: Secondary | ICD-10-CM

## 2022-06-18 NOTE — Progress Notes (Signed)
[facility] CSW Counseling Note  Patient was referred by medical provider. Treatment type: Individual  Presenting Concerns: Patient and/or family reports the following symptoms/concerns: anxiety, depression, stress, and adjustment to diagnosis and treatment Duration of problem: 1 years; Severity of problem: moderate   Orientation:oriented to person, place, time/date, situation, day of week, month of year, and year.   Affect: Appropriate and Depressed Risk of harm to self or others: No plan to harm self or others  Patient and/or Family's Strengths/Protective Factors: Social connections, Concrete supports in place (healthy food, safe environments, etc.), Physical Health (exercise, healthy diet, medication compliance, etc.), and Caregiver has knowledge of parenting & child developmentActive sense of humor  Average or above average intelligence  Capable of independent living  General fund of knowledge  Motivation for treatment/growth  Special hobby/interest  Supportive family/friends  Work skills      Goals Addressed: Patient will:  Reduce symptoms of: anxiety, depression, stress, and adjustment concerns Increase knowledge and/or ability of: coping skills, healthy habits, self-management skills, stress reduction, and communication and boundary setting   Increase healthy adjustment to current life circumstances, Increase adequate support systems for patient/family, Increase motivation to adhere to plan of care, Begin healthy grieving over loss, and relationship building   Progress towards Goals: Progressing   Interventions: Interventions utilized:  CBT, Motivational Interviewing, Solution Focused, Strength-based, Assertiveness Training, Supportive, Family Systems, Reframing, and Other: ACT       Assessment: Patient currently experiencing anxiety, depression and stress.  Patient continues to be receptive to using skills and increasingly open and active in session.  Continued progress was made  this session with identifying triggers which increase anxiety in public spaces. Patient was able to speak in more detail about dofficulty with adjusting to diagnosis and life transitions due to diagnosis. Discussed different ways to improve communication family members and skills for managing anxiety.  ..      Plan: Follow up with CSW: 1 week Behavioral recommendations: continue using skills to assist with anxiety and depression, continue going to store and driving, continue engaging with family for support Referral(s): Support group(s):  N/A         Mike Amas, LCSW

## 2022-06-25 ENCOUNTER — Inpatient Hospital Stay: Payer: 59 | Admitting: Licensed Clinical Social Worker

## 2022-06-25 DIAGNOSIS — D693 Immune thrombocytopenic purpura: Secondary | ICD-10-CM

## 2022-06-25 NOTE — Progress Notes (Addendum)
[facility] CSW Counseling Note  Patient was referred by medical provider. Treatment type: Individual  Presenting Concerns: Patient and/or family reports the following symptoms/concerns: anxiety, depression, and stress Duration of problem: 1 years; Severity of problem: moderate   Orientation:oriented to person, place, time/date, situation, day of week, month of year, and year.   Affect: Appropriate Risk of harm to self or others: No plan to harm self or others  Patient and/or Family's Strengths/Protective Factors: Social connections, Social and Emotional competence, Concrete supports in place (healthy food, safe environments, etc.), Sense of purpose, and Physical Health (exercise, healthy diet, medication compliance, etc.)Active sense of humor  Average or above average intelligence  Capable of independent living  Engineer, drilling fund of knowledge  Motivation for treatment/growth  Physical Health  Special hobby/interest  Supportive family/friends  Work skills      Goals Addressed: Patient will:  Reduce symptoms of: anxiety, depression, and stress Increase knowledge and/or ability of: coping skills, healthy habits, stress reduction, and improve communication   Increase healthy adjustment to current life circumstances, Increase adequate support systems for patient/family, Increase motivation to adhere to plan of care, and Begin healthy grieving over loss   Progress towards Goals: Progressing   Interventions: Interventions utilized:  DBT, Solution Focused, Strength-based, Family Systems, and Reframing      Assessment: Patient currently experiencing anxiety, depression and stress.  Patient continues to be receptive to using skills and increasingly open and active in session.  Continued progress was made this session with identifying triggers which increase anxiety in public spaces. Patient was able to speak in more detail about difficulty with adjusting to  diagnosis and life transitions due to diagnosis. Discussed different ways to improve communication family members and skills for managing anxiety.      Plan: Follow up with CSW: 1 week Behavioral recommendations: continue using skills to assist with anxiety and depression, continue going to store and driving, continue engaging with family for support Referral(s): Support group(s):  N/A         Adelene Amas, LCSW

## 2022-07-02 ENCOUNTER — Inpatient Hospital Stay: Payer: 59 | Attending: Oncology | Admitting: Licensed Clinical Social Worker

## 2022-07-02 DIAGNOSIS — D693 Immune thrombocytopenic purpura: Secondary | ICD-10-CM | POA: Insufficient documentation

## 2022-07-02 NOTE — Progress Notes (Signed)
[facility] CSW Counseling Note  Patient was referred by medical provider. Treatment type: Individual  Presenting Concerns: Patient and/or family reports the following symptoms/concerns: anxiety, depression, stress, and adjustment o diagnosis and treatment Duration of problem: 1 years; Severity of problem: moderate   Orientation:oriented to person, place, time/date, situation, day of week, month of year, and year.   Affect: Appropriate Risk of harm to self or others: No plan to harm self or others  Patient and/or Family's Strengths/Protective Factors: Social connections, Concrete supports in place (healthy food, safe environments, etc.), Sense of purpose, Physical Health (exercise, healthy diet, medication compliance, etc.), and Caregiver has knowledge of parenting & child developmentActive sense of humor  Average or above average intelligence  Capable of independent living  Engineer, drilling fund of knowledge  Motivation for treatment/growth  Physical Health  Religious Affiliation  Special hobby/interest  Supportive family/friends  Work skills      Goals Addressed: Patient will:  Reduce symptoms of: anxiety, depression, stress, and adjustment to  Increase knowledge and/or ability of: coping skills, healthy habits, stress reduction, and communication   Increase healthy adjustment to current life circumstances, Increase adequate support systems for patient/family, and Begin healthy grieving over loss   Progress towards Goals: Progressing   Interventions: Interventions utilized:  CBT, Motivational Interviewing, Strength-based, Counsellor, Supportive, Family Systems, and Reframing      Assessment: Patient currently experiencing anxiety, depression and stress.  Patient continues to be receptive to using skills and increasingly open and active in session.  Continued progress was made this past week, patient was able to continue with his day as  planned, after experiencing an incident in traffic. Discussed the importance of this change and how to continue using skills and communication to manage symptoms of depression and anxiety. Patient continues to struggle with adjusting to diagnosis and life transitions due to diagnosis. Discussed different ways to improve communication family members and skills for managing anxiety.  .      Plan: Follow up with CSW: 1 week Behavioral recommendations:  continue using skills to assist with anxiety and depression, continue going to store and driving, continue engaging with family for support Referral(s): Support group(s):  N/A         Adelene Amas, LCSW

## 2022-07-09 ENCOUNTER — Inpatient Hospital Stay: Payer: 59 | Admitting: Licensed Clinical Social Worker

## 2022-07-09 DIAGNOSIS — D693 Immune thrombocytopenic purpura: Secondary | ICD-10-CM

## 2022-07-09 NOTE — Progress Notes (Signed)
[facility] CSW Counseling Note  Patient was referred by medical provider. Treatment type: Individual  Presenting Concerns: Patient and/or family reports the following symptoms/concerns: anxiety, depression, stress and adjustment to diagnosis and treatment Duration of problem: 1 years; Severity of problem: moderate   Orientation:oriented to person, place, time/date, situation, day of week, month of year, and year.   Affect: Appropriate and Depressed Risk of harm to self or others: No plan to harm self or others  Patient and/or Family's Strengths/Protective Factors: Social connections, Concrete supports in place (healthy food, safe environments, etc.), Physical Health (exercise, healthy diet, medication compliance, etc.), and  Ability for insight  Active sense of humor  Average or above average intelligence  Capable of independent living  Motivation for treatment/growth  Physical Health  Religious Affiliation  Special hobby/interest  Supportive family/friends  Work skills      Goals Addressed: Patient will:  Reduce symptoms of: agitation, anxiety, depression, obsessions, stress, and adjustment to treatment and diagnosis Increase knowledge and/or ability of: coping skills, self-management skills, and stress reduction  Increase healthy adjustment to current life circumstances, Increase adequate support systems for patient/family, Increase motivation to adhere to plan of care, and Begin healthy grieving over loss   Progress towards Goals: Progressing   Interventions: Interventions utilized:  CBT, Solution Focused, Strength-based, Supportive, Family Systems, and Reframing and communication     Assessment: Patient currently experiencing anxiety, depression and stress.  Patient continues to be receptive to using skills and increasingly open and active in session.  Continued progress was made this past week, patient was able to continue with his day as planned, after experiencing an incident  in traffic. Discussed the importance of this change and how to continue using skills and communication to manage symptoms of depression and anxiety. Discussed returning back to work and possibly applying for new positions. Patient continues to struggle with adjusting to diagnosis and life transitions due to diagnosis. Discussed different ways to improve communication family members and skills for managing anxiety.      Plan: Follow up with CSW: agitation, anxiety, depression, obsessions, stress, and adjustment to treatment and diagnosis  Behavioral recommendations:  coping skills, self-management skills, and stress reduction   Referral(s): Support group(s):  N/A          Joseph Art, LCSW

## 2022-07-16 ENCOUNTER — Inpatient Hospital Stay: Payer: 59 | Admitting: Licensed Clinical Social Worker

## 2022-07-16 ENCOUNTER — Encounter: Payer: Self-pay | Admitting: Licensed Clinical Social Worker

## 2022-07-16 DIAGNOSIS — D693 Immune thrombocytopenic purpura: Secondary | ICD-10-CM

## 2022-07-16 NOTE — Progress Notes (Addendum)
[facility] CSW Counseling Note  Patient was referred by medical provider. Treatment type: Individual  Presenting Concerns: Patient and/or family reports the following symptoms/concerns: anxiety, depression, stress, and adjustment to diagnosis and treatment  Duration of problem: 1 years; Severity of problem: moderate   Orientation:oriented to person, place, time/date, situation, day of week, month of year, and year.   Affect: Appropriate and Depressed Risk of harm to self or others: No plan to harm self or others  Patient and/or Family's Strengths/Protective Factors: Social connections, Social and Emotional competence, Concrete supports in place (healthy food, safe environments, etc.), and Physical Health (exercise, healthy diet, medication compliance, etc.)Active sense of humor  Average or above average intelligence  Capable of independent living  Communication skills  General fund of knowledge  Motivation for treatment/growth  Special hobby/interest  Supportive family/friends  Work skills      Goals Addressed: Patient will:  Reduce symptoms of: anxiety, depression, stress, and adjustment concerns Increase knowledge and/or ability of: coping skills, healthy habits, self-management skills, stress reduction, and mindfulness skills   Increase healthy adjustment to current life circumstances, Increase adequate support systems for patient/family, and Begin healthy grieving over loss   Progress towards Goals: Progressing   Interventions: Interventions utilized:  CBT, Solution Focused, Strength-based, Assertiveness Training, Family Systems, Reframing, and Other: ACT       Assessment: Patient currently experiencing  anxiety, depression and stress.  Patient continues to be receptive in session but is experiencing difficulty using skills on a regular basis. Patient reports experiencing difficulty  with making decisions of future planning. Continued discussion about returning back to work and  possibly applying for new positions. Patient continues to struggle with adjusting to diagnosis and life transitions due to diagnosis. Discussed different ways to improve communication family members and skills for managing anxiety. .      Plan: Follow up with CSW: 2 weeks 07/30/2022 @ 9:30AM Behavioral recommendations: coping skills, self-management skills, and stress reduction    Referral(s): Support group(s):  N/A         Joseph Art, LCSW

## 2022-07-30 ENCOUNTER — Inpatient Hospital Stay (INDEPENDENT_AMBULATORY_CARE_PROVIDER_SITE_OTHER): Payer: 59 | Admitting: Oncology

## 2022-07-30 ENCOUNTER — Telehealth: Payer: Self-pay | Admitting: Oncology

## 2022-07-30 ENCOUNTER — Inpatient Hospital Stay: Payer: 59 | Admitting: Licensed Clinical Social Worker

## 2022-07-30 ENCOUNTER — Encounter: Payer: Self-pay | Admitting: Oncology

## 2022-07-30 ENCOUNTER — Other Ambulatory Visit: Payer: Self-pay | Admitting: Oncology

## 2022-07-30 ENCOUNTER — Inpatient Hospital Stay: Payer: 59

## 2022-07-30 ENCOUNTER — Other Ambulatory Visit: Payer: Self-pay

## 2022-07-30 VITALS — BP 124/76 | HR 63 | Temp 97.5°F | Resp 18 | Ht 70.3 in | Wt 219.3 lb

## 2022-07-30 DIAGNOSIS — D696 Thrombocytopenia, unspecified: Secondary | ICD-10-CM

## 2022-07-30 DIAGNOSIS — D693 Immune thrombocytopenic purpura: Secondary | ICD-10-CM

## 2022-07-30 LAB — CMP (CANCER CENTER ONLY)
ALT: 27 U/L (ref 0–44)
AST: 21 U/L (ref 15–41)
Albumin: 4.7 g/dL (ref 3.5–5.0)
Alkaline Phosphatase: 63 U/L (ref 38–126)
Anion gap: 9 (ref 5–15)
BUN: 26 mg/dL — ABNORMAL HIGH (ref 6–20)
CO2: 29 mmol/L (ref 22–32)
Calcium: 9.7 mg/dL (ref 8.9–10.3)
Chloride: 103 mmol/L (ref 98–111)
Creatinine: 1.04 mg/dL (ref 0.61–1.24)
GFR, Estimated: >60 mL/min (ref >60)
Glucose, Bld: 121 mg/dL — ABNORMAL HIGH (ref 70–99)
Potassium: 4.2 mmol/L (ref 3.5–5.1)
Sodium: 141 mmol/L (ref 135–145)
Total Bilirubin: 0.8 mg/dL (ref 0.3–1.2)
Total Protein: 7.7 g/dL (ref 6.5–8.1)

## 2022-07-30 LAB — CBC WITH DIFFERENTIAL (CANCER CENTER ONLY)
Abs Immature Granulocytes: 0.03 10*3/uL (ref 0.00–0.07)
Basophils Absolute: 0.1 10*3/uL (ref 0.0–0.1)
Basophils Relative: 1 %
Eosinophils Absolute: 0.1 10*3/uL (ref 0.0–0.5)
Eosinophils Relative: 2 %
HCT: 47.6 % (ref 39.0–52.0)
Hemoglobin: 15.7 g/dL (ref 13.0–17.0)
Immature Granulocytes: 0 %
Lymphocytes Relative: 33 %
Lymphs Abs: 2.7 10*3/uL (ref 0.7–4.0)
MCH: 29.7 pg (ref 26.0–34.0)
MCHC: 33 g/dL (ref 30.0–36.0)
MCV: 90.2 fL (ref 80.0–100.0)
Monocytes Absolute: 0.6 10*3/uL (ref 0.1–1.0)
Monocytes Relative: 7 %
Neutro Abs: 4.6 10*3/uL (ref 1.7–7.7)
Neutrophils Relative %: 57 %
Platelet Count: 107 10*3/uL — ABNORMAL LOW (ref 150–400)
RBC: 5.28 MIL/uL (ref 4.22–5.81)
RDW: 12.2 % (ref 11.5–15.5)
WBC Count: 8 10*3/uL (ref 4.0–10.5)
nRBC: 0 % (ref 0.0–0.2)

## 2022-07-30 NOTE — Progress Notes (Signed)
[facility] CSW Counseling Note  Patient was referred by medical provider. Treatment type: Individual  Presenting Concerns: Patient and/or family reports the following symptoms/concerns: anxiety, depression, stress, and adjustment to diagnosis and treatment Duration of problem: 1 years; Severity of problem: moderate   Orientation:oriented to person, place, time/date, situation, day of week, month of year, and year.   Affect: Depressed Risk of harm to self or others: No plan to harm self or others  Patient and/or Family's Strengths/Protective Factors: Social connections, Social and Emotional competence, Concrete supports in place (healthy food, safe environments, etc.), Sense of purpose, and Physical Health (exercise, healthy diet, medication compliance, etc.)Ability for insight  Active sense of humor  Average or above average intelligence  Capable of independent living  Communication skills  General fund of knowledge  Motivation for treatment/growth  Religious Affiliation  Special hobby/interest  Supportive family/friends  Work skills      Goals Addressed: Patient will:  Reduce symptoms of: anxiety, depression, stress, and adjustment to diagnosis and treatment Increase knowledge and/or ability of: coping skills, healthy habits, stress reduction, and mindfulness skills   Increase healthy adjustment to current life circumstances, Increase adequate support systems for patient/family, Increase motivation to adhere to plan of care, Improve medication compliance, Begin healthy grieving over loss, and ACT   Progress towards Goals: Progressing   Interventions: Interventions utilized:  CBT, Solution Focused, Strength-based, Assertiveness Training, Supportive, Family Systems, Reframing, and Other: communication       Assessment: Patient currently experiencing anxiety, depression and stress.  Patient continues to be receptive in session but is experiencing difficulty using skills on a regular  basis. Patient reports he continues to experience difficulty with making decisions of future planning. Continued discussion about returning back to work and possibly applying for new positions, and resuming activities he used to enjoy. Patient continues to struggle with adjusting to diagnosis and life transitions due to diagnosis. Discussed different ways to improve communication family members and skills for managing anxiety. .      Plan: Follow up with CSW: 2 weeks 08/13/2022 9:30 AM Behavioral recommendations:  coping skills, self-management skills, and stress reduction     Referral(s): Support group(s):  N/A         Joseph Art, LCSW

## 2022-07-30 NOTE — Telephone Encounter (Signed)
Patient has been scheduled for follow-up visit per 07/30/22 los. Pt given an appt calendar with date and time.  

## 2022-07-30 NOTE — Progress Notes (Signed)
[facility]  [address] [place],  [place]  [address] [phone]  Clinic Day:  07/30/22  Referring physician: Julianne Handler, NP  CHIEF COMPLAINT:  CC:  Immune thrombocytopenic purpura, chronic  Current Treatment:   Prednisone 5 mg daily  HISTORY OF PRESENT ILLNESS:  Mike [name] is a 51 y.o. male with a history of severe thrombocytopenia, felt to represent immune thrombocytopenic purpura.  He has had thrombocytopenia, which has fluctuated up and down since June 2020.  Further laboratory evaluation did not reveal any other specific etiology for his thrombocytopenia.  He had mildly elevated liver transaminases.  Abdominal ultrasound revealed increased echogenicity of hepatic parenchyma suggesting hepatic steatosis, with probable fatty sparing adjacent to gallbladder fossa.  No other abnormality was seen.  We recommended bone marrow examination, but the patient and his mother declined.  The platelet count continued to fluctuate up and down, but as his platelet count remained 25,000 in June 2021, he was started on prednisone 20 mg 3 times daily, with a good response.  He has had leukocytosis secondary to the high-dose prednisone.  He also has had oral candidiasis on occasion. Mike [name] titers were elevated, so this may be representative of chronic fatigue syndrome. We decreased his prednisone to 45 mg in late September and platelets remained stable at 97,000.  Since we were unable to taper him off the prednisone he was treated with rituximab weekly for 4 weeks in October and November 2021.  We had been slowly tapering the prednisone.  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  He has also had hypokalemia resolved with oral supplementation.    On May 18th, his platelet count was 151,000, so I decreased his prednisone to 5 mg daily. He was admitted with COVID-19 on May 24th after leaving work earlier due  to altered mental status and generalized weakness. He had acute kidney injury with a creatinine of 2.2, which normalized with IV hydration. He had fever, but no respiratory symptoms. He was discharged on a prolonged prednisone taper over 15 weeks.  He had been having some chest pain and dyspnea since discontinuing indapamide, so was referred to Dr. Tomie China in Cardiology. He placed him on furosemide 40 mg daily and metoprolol 50 mg daily.  He had had problems with hypokalemia for a long time but this worsened with the diuretic.  He also had him increase his potassium to 40 mg twice daily.  He states his lower extremity edema has resolved with the furosemide.  The ECHO shows left ventricular hypertrophy with an EF between 55-60%, and the CT of the coronary arteries looks good.  He is currently on gabapentin 300 mg BID.  He was on a extremely slow taper of the prednisone, but his platelets dropped to 73,000 when the dose was dropped to 5 mg three times per week.  I increased it back to 5 mg daily and stopped the gabapentin as it wasn't helping.  MRI of the lumbar spine from October 27th revealed degenerative changes of the lumbar spine most prominent L4-5.  INTERVAL HISTORY:  Mike [name] is here for routine follow up and states that he is still having issues with sciatica. He continues prednisone 5 mg daily. He continues to have pain of the back and arthralgias of the left hip, and is unable to sit for any length of time due to the severe pain. He is frequently up and down during his visit, unable to  get comfortable in any position, and clearly in physical and emotional distress.  He is unable to even sit on the toilet without severe pain.  He sees a spine specialist.He is feeling very stressed all the time with his employment and insurance issues, and says "this is driving me insane". The Lexapro is increased to 20 mg daily. Platelets have decreased from 143,000 to 90,000 and 88,000 at his last visit, and white count  and hemoglobin remain normal. Chemistries are unremarkable except for a BUN of 26. His  appetite is fair, and he has lost another 5 pounds since his last visit. He also has an umbilical hernia which is sore. He denies fever, chills or other signs of infection.  He denies nausea, vomiting, bowel issues, or abdominal pain.  He denies sore throat, cough, dyspnea, or chest pain.   REVIEW OF SYSTEMS:  Review of Systems  Constitutional: Negative.  Negative for appetite change, chills, fatigue, fever and unexpected weight change.  HENT:  Negative.    Eyes: Negative.   Respiratory: Negative.  Negative for chest tightness, cough, hemoptysis, shortness of breath and wheezing.   Cardiovascular: Negative.  Negative for chest pain, leg swelling and palpitations.  Gastrointestinal: Negative.  Negative for abdominal distention, abdominal pain, blood in stool, constipation, diarrhea, nausea and vomiting.  Endocrine: Negative.   Genitourinary: Negative.  Negative for difficulty urinating, dysuria, frequency and hematuria.   Musculoskeletal:  Positive for arthralgias (of the left hip) and back pain (chronic). Negative for flank pain, gait problem and myalgias.  Skin: Negative.   Neurological:  Negative for dizziness, extremity weakness, gait problem, headaches, light-headedness, numbness, seizures and speech difficulty.  Hematological: Negative.   Psychiatric/Behavioral:  Negative for depression and sleep disturbance. The patient is nervous/anxious.   All other systems reviewed and are negative.   VITALS:  Blood pressure 124/76, pulse 63, temperature (!) 97.5 F (36.4 C), temperature source Oral, resp. rate 18, height 5' 10.3" (1.786 m), weight 219 lb 4.8 oz (99.5 kg), SpO2 100 %.  Wt Readings from Last 3 Encounters:  07/30/22 219 lb 4.8 oz (99.5 kg)  04/24/22 224 lb 14.4 oz (102 kg)  01/20/22 231 lb 9.6 oz (105.1 kg)    Body mass index is 31.2 kg/m.  Performance status (ECOG): 1 - Symptomatic but  completely ambulatory  PHYSICAL EXAM:  Physical Exam Constitutional:      General: He is not in acute distress.    Appearance: Normal appearance. He is normal weight.  HENT:     Head: Normocephalic and atraumatic.  Eyes:     General: No scleral icterus.    Extraocular Movements: Extraocular movements intact.     Conjunctiva/sclera: Conjunctivae normal.     Pupils: Pupils are equal, round, and reactive to light.  Cardiovascular:     Rate and Rhythm: Normal rate and regular rhythm.     Pulses: Normal pulses.     Heart sounds: Normal heart sounds. No murmur heard.    No friction rub. No gallop.  Pulmonary:     Effort: Pulmonary effort is normal. No respiratory distress.     Breath sounds: Normal breath sounds.  Abdominal:     General: Bowel sounds are normal. There is no distension.     Palpations: Abdomen is soft. There is no hepatomegaly, splenomegaly or mass.     Tenderness: There is no abdominal tenderness.  Musculoskeletal:        General: Normal [name] of motion.     Cervical back: Normal [name] of  motion and neck supple.     Right lower leg: No edema.     Left lower leg: No edema.  Lymphadenopathy:     Cervical: No cervical adenopathy.  Skin:    General: Skin is warm and dry.  Neurological:     General: No focal deficit present.     Mental Status: He is alert and oriented to person, place, and time. Mental status is at baseline.  Psychiatric:        Mood and Affect: Mood normal.        Behavior: Behavior normal.        Thought Content: Thought content normal.        Judgment: Judgment normal.    LABS:      Latest Ref Rng & Units 07/30/2022    8:33 AM 06/16/2022   12:00 AM 04/24/2022   12:00 AM  CBC  WBC 4.0 - 10.5 K/uL 8.0  8.8     8.8      Hemoglobin 13.0 - 17.0 g/dL 54.2  70.6     23.7      Hematocrit 39.0 - 52.0 % 47.6  45     42      Platelets 150 - 400 K/uL 107  88     89         This result is from an external source.      Latest Ref Rng & Units  07/30/2022    8:33 AM 04/24/2022   12:00 AM 03/06/2022    2:14 PM  CMP  Glucose 70 - 99 mg/dL 628   96   BUN 6 - 20 mg/dL 26  24     13    Creatinine 0.61 - 1.24 mg/dL  0.9     3.15   Sodium 135 - 145 mmol/L 141  138     143   Potassium 3.5 - 5.1 mmol/L 4.2  4.1     4.3   Chloride 98 - 111 mmol/L 103  101     103   CO2 22 - 32 mmol/L 29  30     29    Calcium 8.9 - 10.3 mg/dL 9.7  9.7     9.5   Total Protein 6.5 - 8.1 g/dL 7.7   7.5   Total Bilirubin 0.3 - 1.2 mg/dL 0.8   1.3   Alkaline Phos 38 - 126 U/L 63  61     58   AST 15 - 41 U/L 21  28     15    ALT 0 - 44 U/L 27  26     17       This result is from an external source.    STUDIES:  No results found.    HISTORY:   Allergies: No Known Allergies  Current Medications: Current Outpatient Medications  Medication Sig Dispense Refill   acetaminophen (TYLENOL) 500 MG tablet Take 500 mg by mouth every 4 (four) hours as needed for moderate pain.     albuterol (VENTOLIN HFA) 108 (90 Base) MCG/ACT inhaler Inhale 2 puffs into the lungs every 4 (four) hours as needed for wheezing or shortness of breath.     amLODipine (NORVASC) 10 MG tablet Take 10 mg by mouth daily.     Ascorbic Acid (VITAMIN C) 1000 MG tablet Take 1,000 mg by mouth daily.     benazepril (LOTENSIN) 20 MG tablet Take 1 tablet (20 mg total) by mouth daily. 90 tablet 3   CALCIUM PO  Take 1 tablet by mouth daily.     escitalopram (LEXAPRO) 20 MG tablet Take 20 mg by mouth daily.     folic acid (FOLVITE) 1 MG tablet Take 1 tablet (1 mg total) by mouth daily. 90 tablet 3   furosemide (LASIX) 40 MG tablet Take 1 tablet (40 mg total) by mouth daily. 90 tablet 1   glipiZIDE (GLUCOTROL XL) 10 MG 24 hr tablet Take 10 mg by mouth daily.     metFORMIN (GLUCOPHAGE-XR) 500 MG 24 hr tablet Take 1,000 mg by mouth 2 (two) times daily.     metoprolol succinate (TOPROL-XL) 100 MG 24 hr tablet Take 1 tablet (100 mg total) by mouth daily. Take with or immediately following a meal. 90  tablet 2   NON FORMULARY Take 700 mg by mouth daily. Apple pectin      omeprazole (PRILOSEC) 20 MG capsule Take 20 mg by mouth daily.     oxyCODONE (OXY IR/ROXICODONE) 5 MG immediate release tablet Take 5 mg by mouth every 6 (six) hours as needed for pain.     potassium chloride SA (KLOR-CON M) 20 MEQ tablet Take 2 tablets (40 mEq total) by mouth 3 (three) times daily. 180 tablet 5   predniSONE (DELTASONE) 5 MG tablet Take 1 tablet (5 mg total) by mouth daily with breakfast. 90 tablet 1   pregabalin (LYRICA) 25 MG capsule Take 25-50 mg by mouth at bedtime. Have not started yet     vitamin B-12 (CYANOCOBALAMIN) 1000 MCG tablet Take 1,000 mcg by mouth daily.     vitamin k 100 MCG tablet Take 100 mcg by mouth daily.     No current facility-administered medications for this visit.     ASSESSMENT & PLAN:   Assessment:  1. Significant thrombocytopenia, which has been fluctuating up and down since June 2020.  This is chronic immune thrombocytopenic purpura.  We placed him on prednisone 20 mg three times daily in early June 2020.  We were unable to taper his steroids, so he underwent rituximab therapy weekly for 4 weeks in October 2021 with a partial response.  He is not a good candidate for splenectomy unless absolutely necessary.  His platelet count had remained above 75,000, we have steadily tapered his prednisone and prior to his hospital admission in May 2022, he was on prednisone  daily. He was admitted for COVID and placed back on high dose prednisone, with an extremely slow taper, down to 2.5 mg daily.  His platelet count worsened in August 2022, so we increased the dose back to 5 mg daily, but he has not normalized, so remains on this dose.       2. Steroid induced diabetes, for which he continues metformin 500 mg twice daily and glipizide 5 mg daily.     3.  Elevated Epstein Barr titers.  This may be indicative of chronic fatigue syndrome, and I advised that he rest as needed.  4.  Sleep  apnea, untreated. I have encouraged him to use his CPAP mask for better quality of life and to reduce his daytime sleepiness.  He has been referred to  Pulmonary.   5.  Chronic anxiety, mainly related to his health, and also to his job and insurance issues.  6.  Hospital admission with acute kidney injury due to dehydration associated with COVID-19, May 2022.   He was placed on an extremely slow prednisone taper.    7.  Significant left hip and lower back pain.  He is  following with a spine specialist.  He is undergoing physical therapy. MRI of the lumbar spine revealed degenerative changes of L4-5. He is unable to have any invasive procedure with his persistent thrombocytopenia.  8.  Elevated bilirubin which has persisted but remains stable. I suspect possible Gilbert's syndrome, but we will hold off on ordering [id number] as it would not change our plan of care.   Plan:     He will continue prednisone dose 5 mg daily.  He will continue to follow with Dr. Tomie China and the spine specialist. We will plan to see him back in 12 weeks with a CBC and comprehensive metabolic panel for repeat clinical assessment. The patient understands the plans discussed today and are in agreement with them. They know to contact our office if he develops concerns prior to his next appointment.  I provided 15 minutes of face-to-face time during this this encounter and > 50% was spent counseling as documented under my assessment and plan.   ADDENDUM: His platelet count today is improved to 107,000, he will be notified.

## 2022-08-01 ENCOUNTER — Telehealth: Payer: Self-pay

## 2022-08-01 NOTE — Telephone Encounter (Signed)
-----   Message from Dellia Beckwith, MD sent at 07/31/2022  1:45 PM EST ----- Regarding: call Tell him platelets up to 107,000. Rest looks pretty good

## 2022-08-01 NOTE — Telephone Encounter (Signed)
Attempted to contact patient. No answer. 

## 2022-08-11 ENCOUNTER — Other Ambulatory Visit: Payer: Self-pay | Admitting: Oncology

## 2022-08-11 DIAGNOSIS — D693 Immune thrombocytopenic purpura: Secondary | ICD-10-CM

## 2022-08-13 ENCOUNTER — Inpatient Hospital Stay: Payer: 59 | Attending: Oncology | Admitting: Licensed Clinical Social Worker

## 2022-08-13 DIAGNOSIS — D696 Thrombocytopenia, unspecified: Secondary | ICD-10-CM

## 2022-08-13 NOTE — Progress Notes (Signed)
[facility] CSW Counseling Note  Patient was referred by medical provider. Treatment type: Individual  Presenting Concerns: Patient and/or family reports the following symptoms/concerns: anxiety, depression, stress, and adjustment to diagnosis and treatment Duration of problem: 1 years; Severity of problem: moderate   Orientation:oriented to person, place, time/date, situation, day of week, month of year, and year.   Affect: Depressed, Appropiate Risk of harm to self or others: No plan to harm self or others  Patient and/or Family's Strengths/Protective Factors: Social connections, Social and Emotional competence, Concrete supports in place (healthy food, safe environments, etc.), Physical Health (exercise, healthy diet, medication compliance, etc.), and Parental ResilienceActive sense of humor  Average or above average intelligence  Capable of independent living  Communication skills  General fund of knowledge  Motivation for treatment/growth  Religious Affiliation  Special hobby/interest  Supportive family/friends  Work skills      Goals Addressed: Patient will:  Reduce symptoms of: anxiety, depression, and stress, adjustment concerns Increase knowledge and/or ability of: coping skills, healthy habits, and stress reduction  Increase healthy adjustment to current life circumstances, Increase adequate support systems for patient/family, Begin healthy grieving over loss, and mindfulness, communication skills   Progress towards Goals: Progressing   Interventions: Interventions utilized:  CBT, Solution Focused, Strength-based, Supportive, Family Systems, Reframing, and Other: ACT       Assessment: Patient currently experiencing anxiety, depression and stress.  Patient continues to be receptive in session and continues experiencing difficulty using skills on a regular basis. Patient reports he continues to experience difficulty with making decisions of future planning. Continued  discussion about returning back to work and possibly applying for new positions, and resuming activities he used to enjoy. Patient continues to struggle with adjusting to diagnosis and life transitions due to diagnosis. Discussed different ways to improve communication family members and skills for managing anxiety.      Plan: Follow up with CSW: 2 weeks 08/27/2022 @ 9:30AM Behavioral recommendations: coping skills, self-management skills, and stress reduction      Referral(s): Support group(s):  N/A         Joseph Art, LCSW

## 2022-08-27 ENCOUNTER — Inpatient Hospital Stay: Payer: 59 | Admitting: Licensed Clinical Social Worker

## 2022-08-27 DIAGNOSIS — D693 Immune thrombocytopenic purpura: Secondary | ICD-10-CM

## 2022-08-27 NOTE — Progress Notes (Signed)
[facility] CSW Counseling Note  Patient was referred by medical provider. Treatment type: Individual  Presenting Concerns: Patient and/or family reports the following symptoms/concerns: anxiety, depression, stress, and adjustment to diagnosis and treatment Duration of problem: 1 years; Severity of problem: moderate   Orientation:oriented to person, place, time/date, situation, day of week, month of year, and year.   Affect: Depressed Risk of harm to self or others: No plan to harm self or others  Patient and/or Family's Strengths/Protective Factors: Social connections, Concrete supports in place (healthy food, safe environments, etc.), Physical Health (exercise, healthy diet, medication compliance, etc.), and Caregiver has knowledge of parenting & child developmentActive sense of humor  Average or above average intelligence  Capable of independent living  Arboriculturist fund of knowledge  Motivation for treatment/growth  Physical Health  Special hobby/interest  Supportive family/friends  Work skills      Goals Addressed: Patient will:  Reduce symptoms of: anxiety, depression, stress, and adjustment concerns Increase knowledge and/or ability of: coping skills, healthy habits, stress reduction, and communication   Increase healthy adjustment to current life circumstances, Increase adequate support systems for patient/family, Begin healthy grieving over loss, and mindfulness and communication skills   Progress towards Goals: Progressing   Interventions: Interventions utilized:  CBT, Solution Focused, Strength-based, Supportive, Family Systems, Reframing, and Other: ACT       Assessment: Patient currently experiencing anxiety, depression and stress.  Patient continues to be receptive in session and continues experiencing difficulty using skills on a regular basis. Patient reports he spoke with his daughter before Christmas. Discussed different ways to  keep the line of communication with his daughter. Continued discussion about returning back to work and possibly applying for new positions, and resuming activities he used to enjoy. Patient continues to struggle with adjusting to diagnosis and life transitions due to diagnosis. Discussed different ways to improve communication family members and skills for managing anxiety.       Plan: Follow up with CSW: 09/10/2022 Behavioral recommendations: coping skills, self-management skills, and stress reduction, communication skills  Referral(s): Support group(s):  N/A         Joseph Art, LCSW

## 2022-08-29 ENCOUNTER — Telehealth: Payer: Self-pay

## 2022-08-29 NOTE — Telephone Encounter (Signed)
-----   Message from Dellia Beckwith, MD sent at 08/24/2022 10:41 AM EST ----- Regarding: records I will give you forms, will need to send, along with last 2 office notes, to [facility], see info.  They also request this be sent to   Charlean Merl  FAX [phone]  Phone [phone] [place]

## 2022-08-29 NOTE — Telephone Encounter (Signed)
Forms faxed

## 2022-09-10 ENCOUNTER — Inpatient Hospital Stay: Payer: 59 | Attending: Oncology | Admitting: Licensed Clinical Social Worker

## 2022-09-10 DIAGNOSIS — D696 Thrombocytopenia, unspecified: Secondary | ICD-10-CM

## 2022-09-10 NOTE — Progress Notes (Signed)
[place] CSW Counseling Note  Patient was referred by medical provider. Treatment type: Individual  Presenting Concerns: Patient and/or family reports the following symptoms/concerns: anxiety, depression, stress, and adjustment concerns to diagnosis and treatment Duration of problem: 1 years; Severity of problem: moderate   Orientation:oriented to person, place, time/date, situation, day of week, month of year, and year.   Affect: Depressed Risk of harm to self or others: No plan to harm self or others  Patient and/or Family's Strengths/Protective Factors: Social connections, Concrete supports in place (healthy food, safe environments, etc.), and Physical Health (exercise, healthy diet, medication compliance, etc.)Active sense of humor  Average or above average intelligence  Capable of independent living  Communication skills  Motivation for treatment/growth  Physical Health  Religious Affiliation  Special hobby/interest  Supportive family/friends  Work skills      Goals Addressed: Patient will:  Reduce symptoms of: anxiety, depression, stress, and adjustment concerns Increase knowledge and/or ability of: coping skills, healthy habits, stress reduction, and communication   Increase healthy adjustment to current life circumstances, Increase adequate support systems for patient/family, Begin healthy grieving over loss, and mindfulness skills   Progress towards Goals: Progressing   Interventions: Interventions utilized:  CBT, Solution Focused, Strength-based, Supportive, Family Systems, Reframing, and Other: ACT       Assessment: Patient currently experiencing anxiety, depression and stress. Patient continues to be receptive in session and continues experiencing difficulty using skills on a regular basis. Patient reports he is still experiencing difficulty communicating with his daughter.  Discussed different ways to address concerns, and to engage in effective communication.  Patient  reports he has not applied for new positions but has started to look.  Patient stated he is mainly concerned about how he will manage work on the days he is not feeling well.  Patient continues to struggle with adjusting to diagnosis and life transitions due to diagnosis.        Plan: Follow up with CSW: 09/24/2022 Behavioral recommendations: coping skills, self-management skills, and stress reduction, communication skills   Referral(s): Support group(s):    N/A       Adelene Amas, LCSW

## 2022-09-24 ENCOUNTER — Inpatient Hospital Stay: Payer: 59 | Admitting: Licensed Clinical Social Worker

## 2022-09-24 DIAGNOSIS — D693 Immune thrombocytopenic purpura: Secondary | ICD-10-CM

## 2022-09-24 NOTE — Progress Notes (Signed)
[place] CSW Counseling Note  Patient was referred by medical provider. Treatment type: Individual  Presenting Concerns: Patient and/or family reports the following symptoms/concerns: anxiety, depression, and stress Duration of problem: 1 years; Severity of problem: moderate   Orientation:oriented to person, place, time/date, situation, day of week, month of year, and year.   Affect: Depressed Risk of harm to self or others: No plan to harm self or others  Patient and/or Family's Strengths/Protective Factors: Social connections, Social and Emotional competence, Concrete supports in place (healthy food, safe environments, etc.), Sense of purpose, and Physical Health (exercise, healthy diet, medication compliance, etc.)Average or above average intelligence  Capable of independent living  Communication skills  General fund of knowledge  Motivation for treatment/growth  Physical Health  Religious Affiliation  Special hobby/interest  Supportive family/friends  Work skills      Goals Addressed: Patient will:  Reduce symptoms of: anxiety, depression, stress, and adjustment to diagnosis Increase knowledge and/or ability of: coping skills, healthy habits, self-management skills, and stress reduction  Increase healthy adjustment to current life circumstances, Increase adequate support systems for patient/family, Increase motivation to adhere to plan of care, Improve medication compliance, and Begin healthy grieving over loss   Progress towards Goals: Progressing   Interventions: Interventions utilized:  CBT, Solution Focused, Strength-based, Counsellor, Psychosocial Skills: ACT, Systems analyst, Family Systems, and Reframing      Assessment: Patient currently experiencing anxiety, depression and stress. Patient continues to be receptive in session and continues experiencing difficulty using skills on a regular basis.  Discussed different ways to approach applying for new  positions and the right not to disclose medical conditions, unless he felt it necessary.  Discussed skills to manage stress and anxiety during interviews.  Patient continues to struggle with adjusting to diagnosis and life transitions due to diagnosis.      Plan: Follow up with CSW: 2 weeks  Behavioral recommendations: coping skills, self-management skills, and stress reduction, communication skills   Referral(s): Support group(s):    N/A       Adelene Amas, LCSW

## 2022-09-29 ENCOUNTER — Other Ambulatory Visit: Payer: Self-pay | Admitting: Oncology

## 2022-09-29 DIAGNOSIS — E876 Hypokalemia: Secondary | ICD-10-CM

## 2022-09-30 ENCOUNTER — Other Ambulatory Visit: Payer: Self-pay | Admitting: Hematology and Oncology

## 2022-09-30 DIAGNOSIS — E782 Mixed hyperlipidemia: Secondary | ICD-10-CM

## 2022-09-30 DIAGNOSIS — E559 Vitamin D deficiency, unspecified: Secondary | ICD-10-CM

## 2022-09-30 DIAGNOSIS — I10 Essential (primary) hypertension: Secondary | ICD-10-CM

## 2022-09-30 DIAGNOSIS — E1169 Type 2 diabetes mellitus with other specified complication: Secondary | ICD-10-CM

## 2022-09-30 DIAGNOSIS — D693 Immune thrombocytopenic purpura: Secondary | ICD-10-CM

## 2022-10-07 ENCOUNTER — Encounter: Payer: Self-pay | Admitting: Licensed Clinical Social Worker

## 2022-10-07 NOTE — Progress Notes (Signed)
[place] CSW Progress Note  Clinical Social Worker  patient's sister Marlowe Kays contacted CSW  to request letter with patient's status for the [employer] case. CSW stated I would give the patient a letter tomorrow 10/08/22.    Adelene Amas, LCSW

## 2022-10-08 ENCOUNTER — Inpatient Hospital Stay: Payer: 59 | Attending: Oncology | Admitting: Licensed Clinical Social Worker

## 2022-10-08 DIAGNOSIS — D693 Immune thrombocytopenic purpura: Secondary | ICD-10-CM | POA: Insufficient documentation

## 2022-10-08 DIAGNOSIS — Z79899 Other long term (current) drug therapy: Secondary | ICD-10-CM | POA: Insufficient documentation

## 2022-10-08 NOTE — Progress Notes (Signed)
[place] CSW Counseling Note  Patient was referred by medical provider. Treatment type: Individual  Presenting Concerns: Patient and/or family reports the following symptoms/concerns: anxiety, depression, stress, and adjustment to diagnosis and treatment Duration of problem: 1 years; Severity of problem: moderate   Orientation:oriented to person, place, time/date, situation, day of week, month of year, and year.   Affect: Appropriate and Depressed Risk of harm to self or others: No plan to harm self or others  Patient and/or Family's Strengths/Protective Factors: Social connections, Social and Emotional competence, Concrete supports in place (healthy food, safe environments, etc.), Physical Health (exercise, healthy diet, medication compliance, etc.), and Caregiver has knowledge of parenting & child developmentActive sense of humor  Average or above average intelligence  Capable of independent living  Engineer, drilling fund of knowledge  Motivation for treatment/growth  Physical Health  Religious Affiliation  Special hobby/interest  Supportive family/friends  Work skills      Goals Addressed: Patient will:  Reduce symptoms of: anxiety, depression, stress, and adjustment concerns Increase knowledge and/or ability of: coping skills, healthy habits, stress reduction, and communication   Increase healthy adjustment to current life circumstances, Increase adequate support systems for patient/family, Increase motivation to adhere to plan of care, and Begin healthy grieving over loss   Progress towards Goals: Progressing   Interventions: Interventions utilized:  CBT, Solution Focused, Strength-based, Supportive, Family Systems, Reframing, and Other: ACT       Assessment: Patient currently experiencing anxiety, depression and stress. Patient continues to be receptive in session and continues experiencing difficulty using skills on a regular basis.  Discussed  working again and different options for future positions.  Discussed skills to manage stress and anxiety skills to assist with speaking with other people and inquiring about jobs. Patient is improving in adjustment to life impact due to diagnosis. Patient mentioned he was approved for Medicaid within the last two weeks, but has not received information packet in the mail.      Plan: Follow up with CSW: 2 weeks  Behavioral recommendations: coping skills, self-management skills, and stress reduction, communication skills    Referral(s): Support group(s):    N/A       Adelene Amas, LCSW

## 2022-10-22 ENCOUNTER — Inpatient Hospital Stay: Payer: 59 | Admitting: Licensed Clinical Social Worker

## 2022-10-22 DIAGNOSIS — D693 Immune thrombocytopenic purpura: Secondary | ICD-10-CM

## 2022-10-22 NOTE — Progress Notes (Signed)
[place] CSW Counseling Note  Patient was referred by medical provider. Treatment type: Individual  Presenting Concerns: Patient and/or family reports the following symptoms/concerns: anxiety, depression, stress, and adjustment concerns due to diagnosis and treatment Duration of problem: 1.5 years; Severity of problem: moderate   Orientation:oriented to person, place, time/date, situation, day of week, month of year, and year.   Affect: Depressed Risk of harm to self or others: No plan to harm self or others  Patient and/or Family's Strengths/Protective Factors: Social connections, Concrete supports in place (healthy food, safe environments, etc.), Physical Health (exercise, healthy diet, medication compliance, etc.), and Caregiver has knowledge of parenting & child developmentAverage or above average intelligence  Capable of independent living  Engineer, drilling fund of knowledge  Motivation for treatment/growth  Physical Health  Religious Affiliation  Special hobby/interest  Supportive family/friends  Work skills      Goals Addressed: Patient will:  Reduce symptoms of: anxiety, depression, stress, and adjustment concerns Increase knowledge and/or ability of: coping skills, healthy habits, self-management skills, and stress reduction  Increase healthy adjustment to current life circumstances, Increase adequate support systems for patient/family, Increase motivation to adhere to plan of care, and Begin healthy grieving over loss   Progress towards Goals: Progressing   Interventions: Interventions utilized:  CBT, Solution Focused, Strength-based, Supportive, Systems analyst, Family Systems, and Reframing      Assessment: Patient currently experiencing anxiety, depression and stress. Patient continues to be receptive in session and continues experiencing difficulty using skills on a regular basis.  Discussed working again and different options for  future positions.  Discussed skills to manage stress and anxiety skills to assist with speaking with other people and inquiring about jobs. Discussed financial criteria for Medicaid.  Patient mentioned he has been waiting for three years for disability approval, and is worried about long term financial viability. Patient is improving in adjustment to life impact due to diagnosis. .      Plan: Follow up with CSW: 11/05/2022 @ 9:30 Behavioral recommendations: coping skills, self-management skills, and stress reduction, communication skills     Referral(s): Support group(s):  N/A         Mike Amas, LCSW

## 2022-10-28 NOTE — Progress Notes (Signed)
[facility]  [address] [place],  [place]  [address] [phone]  Clinic Day:  10/30/22   Referring physician: [name], NP  CHIEF COMPLAINT:  CC:  Immune thrombocytopenic purpura, chronic  Current Treatment:   Prednisone 5 mg daily  HISTORY OF PRESENT ILLNESS:  Mike [name] is a 52 y.o. male with a history of severe thrombocytopenia, felt to represent immune thrombocytopenic purpura.  He has had thrombocytopenia, which has fluctuated up and down since June 2020.  Further laboratory evaluation did not reveal any other specific etiology for his thrombocytopenia.  He had mildly elevated liver transaminases.  Abdominal ultrasound revealed increased echogenicity of hepatic parenchyma suggesting hepatic steatosis, with probable fatty sparing adjacent to gallbladder fossa.  No other abnormality was seen.  We recommended bone marrow examination, but the patient and his mother declined.  The platelet count continued to fluctuate up and down, but as his platelet count remained 25,000 in June 2021, he was started on prednisone 20 mg 3 times daily, with a good response.  He has had leukocytosis secondary to the high-dose prednisone.  He also has had oral candidiasis on occasion. [name] titers were elevated, so this may be representative of chronic fatigue syndrome. We decreased his prednisone to 45 mg in late September and platelets remained stable at 97,000.  Since we were unable to taper him off the prednisone he was treated with rituximab weekly for 4 weeks in October and November 2021.  We had been slowly tapering the prednisone.  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  He has also had hypokalemia resolved with oral supplementation.    On May 18th, his platelet count was 151,000, so I decreased his prednisone to 5 mg daily. He was admitted with COVID-19 on May 24th after leaving work earlier  due to altered mental status and generalized weakness. He had acute kidney injury with a creatinine of 2.2, which normalized with IV hydration. He had fever, but no respiratory symptoms. He was discharged on a prolonged prednisone taper over 15 weeks.  He had been having some chest pain and dyspnea since discontinuing indapamide, so was referred to Dr. Geraldo Pitter in Cardiology. He placed him on furosemide 40 mg daily and metoprolol 50 mg daily.  He had had problems with hypokalemia for a long time but this worsened with the diuretic.  He also had him increase his potassium to 40 mg twice daily.  He states his lower extremity edema has resolved with the furosemide.  The ECHO shows left ventricular hypertrophy with an EF between 55-60%, and the CT of the coronary arteries looks good.  He is currently on gabapentin 300 mg BID.  He was on a extremely slow taper of the prednisone, but his platelets dropped to 73,000 when the dose was dropped to 5 mg three times per week.  I increased it back to 5 mg daily and stopped the gabapentin as it wasn't helping.  MRI of the lumbar spine from October 27th revealed degenerative changes of the lumbar spine most prominent L4-5.  INTERVAL HISTORY:  Mike [name] is here today for routine follow up. He states he'll have days where he has no energy, almost like he's going to pass out and then in about 2 days he will have a good day. He says it is "aggravating" and still experiences the pain with his spine. He states he had a panic attack on Sunday, he was prescribed  Buspar 5 mg to help with his anxiety and will be starting this today and I encouraged him to go ahead with that and explained this is a low dose of a mild medication. He notes more episodes where he has almost blacked out and had a "rage" feeling afterwards that he cannot control, once occurred when he was at a traffic light. I suggested we schedule an MRI of his brain just to be sure there is nothing related to any other  conditions related to pre- syncope. I explained this could be hypoglycemia or a petit mal seizure or could be medication effect as he is on quite a few. Platelets are decreased to 85,000 and BUN is 31. I have reccommended he increase his fluid intake. Otherwise his CBC and CMP were normal. He has asked what he can do to improve his cholesterol. I have suggested he talk with the cardiologist  further for recommendations. His  appetite is fair, and his weight remains stable since his last visit. He also has an umbilical hernia which is sore. He denies fever, chills or other signs of infection.  He denies nausea, vomiting, bowel issues, or abdominal pain.  He denies sore throat, cough, dyspnea, or chest pain.    REVIEW OF SYSTEMS:  Review of Systems  Constitutional: Negative.  Negative for appetite change, chills, fatigue, fever and unexpected weight change.  HENT:  Negative.    Eyes: Negative.   Respiratory: Negative.  Negative for chest tightness, cough, hemoptysis, shortness of breath and wheezing.   Cardiovascular: Negative.  Negative for chest pain, leg swelling and palpitations.  Gastrointestinal: Negative.  Negative for abdominal distention, abdominal pain, blood in stool, constipation, diarrhea, nausea and vomiting.  Endocrine: Negative.   Genitourinary: Negative.  Negative for difficulty urinating, dysuria, frequency and hematuria.   Musculoskeletal:  Positive for arthralgias (of the left hip) and back pain (chronic). Negative for flank pain, gait problem and myalgias.  Skin: Negative.   Neurological:  Negative for dizziness, extremity weakness, gait problem, headaches, light-headedness, numbness, seizures and speech difficulty.  Hematological: Negative.   Psychiatric/Behavioral:  Negative for depression and sleep disturbance. The patient is nervous/anxious.   All other systems reviewed and are negative.   VITALS:  Blood pressure 117/67, pulse 60, temperature 97.9 F (36.6 C),  temperature source Oral, resp. rate 16, height 5' 10.3" (1.786 m), weight 220 lb 9.6 oz (100.1 kg), SpO2 100 %.  Wt Readings from Last 3 Encounters:  10/30/22 220 lb 9.6 oz (100.1 kg)  07/30/22 219 lb 4.8 oz (99.5 kg)  04/24/22 224 lb 14.4 oz (102 kg)    Body mass index is 31.38 kg/m.  Performance status (ECOG): 1 - Symptomatic but completely ambulatory  PHYSICAL EXAM:  Physical Exam Constitutional:      General: He is not in acute distress.    Appearance: Normal appearance. He is normal weight.  HENT:     Head: Normocephalic and atraumatic.     Right Ear: Tympanic membrane normal. There is no impacted cerumen.     Left Ear: Tympanic membrane normal. There is no impacted cerumen.  Eyes:     General: No scleral icterus.    Extraocular Movements: Extraocular movements intact.     Conjunctiva/sclera: Conjunctivae normal.     Pupils: Pupils are equal, round, and reactive to light.  Cardiovascular:     Rate and Rhythm: Normal rate and regular rhythm.     Pulses: Normal pulses.     Heart sounds: Normal heart sounds. No  murmur heard.    No friction rub. No gallop.  Pulmonary:     Effort: Pulmonary effort is normal. No respiratory distress.     Breath sounds: Normal breath sounds.  Abdominal:     General: Bowel sounds are normal. There is no distension.     Palpations: Abdomen is soft. There is no hepatomegaly, splenomegaly or mass.     Tenderness: There is no abdominal tenderness.  Musculoskeletal:        General: Normal range of motion.     Cervical back: Normal range of motion and neck supple.     Right lower leg: No edema.     Left lower leg: No edema.  Lymphadenopathy:     Cervical: No cervical adenopathy.  Skin:    General: Skin is warm and dry.  Neurological:     General: No focal deficit present.     Mental Status: He is alert and oriented to person, place, and time. Mental status is at baseline.  Psychiatric:        Mood and Affect: Mood normal.        Behavior:  Behavior normal.        Thought Content: Thought content normal.        Judgment: Judgment normal.    LABS:      Latest Ref Rng & Units 10/29/2022    8:57 AM 07/30/2022    8:33 AM 06/16/2022   12:00 AM  CBC  WBC 4.0 - 10.5 K/uL 7.9  8.0  8.8      Hemoglobin 13.0 - 17.0 g/dL 14.9  15.7  15.4      Hematocrit 39.0 - 52.0 % 44.7  47.6  45      Platelets 150 - 400 K/uL 85  107  88         This result is from an external source.      Latest Ref Rng & Units 10/29/2022    8:57 AM 07/30/2022    8:33 AM 04/24/2022   12:00 AM  CMP  Glucose 70 - 99 mg/dL 107  121    BUN 6 - 20 mg/dL 31  26  24       Creatinine 0.61 - 1.24 mg/dL 1.06  1.04  0.9      Sodium 135 - 145 mmol/L 137  141  138      Potassium 3.5 - 5.1 mmol/L 3.8  4.2  4.1      Chloride 98 - 111 mmol/L 100  103  101      CO2 22 - 32 mmol/L 28  29  30       Calcium 8.9 - 10.3 mg/dL 9.3  9.7  9.7      Total Protein 6.5 - 8.1 g/dL 7.8  7.7    Total Bilirubin 0.3 - 1.2 mg/dL 1.3  0.8    Alkaline Phos 38 - 126 U/L 65  63  61      AST 15 - 41 U/L 24  21  28       ALT 0 - 44 U/L 32  27  26         This result is from an external source.    STUDIES:  No results found.    HISTORY:   Allergies: No Known Allergies  Current Medications: Current Outpatient Medications  Medication Sig Dispense Refill   azelastine (ASTELIN) 0.1 % nasal spray SMARTSIG:1 Spray(s) Both Nares Every 12 Hours     busPIRone (BUSPAR) 5 MG  tablet Take 5 mg by mouth 2 (two) times daily.     acetaminophen (TYLENOL) 500 MG tablet Take 500 mg by mouth every 4 (four) hours as needed for moderate pain.     albuterol (VENTOLIN HFA) 108 (90 Base) MCG/ACT inhaler Inhale 2 puffs into the lungs every 4 (four) hours as needed for wheezing or shortness of breath.     amLODipine (NORVASC) 10 MG tablet Take 10 mg by mouth daily.     Ascorbic Acid (VITAMIN C) 1000 MG tablet Take 1,000 mg by mouth daily.     benazepril (LOTENSIN) 20 MG tablet Take 1 tablet (20 mg total) by  mouth daily. 90 tablet 3   CALCIUM PO Take 1 tablet by mouth daily.     escitalopram (LEXAPRO) 20 MG tablet Take 20 mg by mouth daily.     folic acid (FOLVITE) 1 MG tablet TAKE ONE TABLET BY MOUTH ONCE DAILY 90 tablet 3   furosemide (LASIX) 40 MG tablet Take 1 tablet (40 mg total) by mouth daily. 30 tablet 0   glipiZIDE (GLUCOTROL XL) 10 MG 24 hr tablet Take 10 mg by mouth daily.     metFORMIN (GLUCOPHAGE-XR) 500 MG 24 hr tablet Take 1,000 mg by mouth 2 (two) times daily.     metoprolol succinate (TOPROL-XL) 100 MG 24 hr tablet Take 1 tablet (100 mg total) by mouth daily. Take with or immediately following a meal. 90 tablet 2   NON FORMULARY Take 700 mg by mouth daily. Apple pectin      omeprazole (PRILOSEC) 20 MG capsule Take 20 mg by mouth daily.     oxyCODONE (OXY IR/ROXICODONE) 5 MG immediate release tablet Take 5 mg by mouth every 6 (six) hours as needed for pain.     potassium chloride SA (KLOR-CON M) 20 MEQ tablet Take 2 tablets (40 mEq total) by mouth 3 (three) times daily. 180 tablet 5   predniSONE (DELTASONE) 5 MG tablet Take 1 tablet (5 mg total) by mouth daily with breakfast. 90 tablet 1   pregabalin (LYRICA) 25 MG capsule Take 25-50 mg by mouth at bedtime. Have not started yet     vitamin B-12 (CYANOCOBALAMIN) 1000 MCG tablet Take 1,000 mcg by mouth daily.     vitamin k 100 MCG tablet Take 100 mcg by mouth daily.     No current facility-administered medications for this visit.     ASSESSMENT & PLAN:   Assessment:  1. Significant thrombocytopenia, which has been fluctuating up and down since June 2020.  This is chronic immune thrombocytopenic purpura.  We placed him on prednisone 20 mg three times daily in early June 2020.  We were unable to taper his steroids, so he underwent rituximab therapy weekly for 4 weeks in October 2021 with a partial response.  He is not a good candidate for splenectomy unless absolutely necessary.  His platelet count had remained above 75,000, we have  steadily tapered his prednisone and prior to his hospital admission in May 2022, he was on prednisone 5mg  daily. He was admitted for COVID and placed back on high dose prednisone, with an extremely slow taper, down to 2.5 mg daily.  His platelet count worsened in August 2022, so we increased the dose back to 5 mg daily, but he has not normalized, so remains on this dose.  His latest platelet count dropped to 85,000. I cannot explain this but it has been going up and down like this for years.    2. Steroid  induced diabetes, for which he continues metformin 500 mg twice daily and glipizide 5 mg daily.     3.  Elevated Epstein Barr titers.  This may be indicative of chronic fatigue syndrome, and I advised that he rest as needed.  4.  Sleep apnea, untreated. I have encouraged him to use his CPAP mask for better quality of life and to reduce his daytime sleepiness.  He has been referred to  Pulmonary.   5.  Chronic anxiety, mainly related to his health, and also to his job and insurance issues. I encouraged him to start the Buspar 5 mg. We do have him seeing our LCSW for counseling.   [facility] admission with acute kidney injury due to dehydration associated with COVID-19, May 2022.   He was placed on an extremely slow prednisone taper.    7.  Significant left hip and lower back pain.  He is following with a spine specialist.  He has undergone physical therapy. MRI of the lumbar spine revealed degenerative changes of L4-5. He is unable to have any invasive procedure with his persistent thrombocytopenia.  8.  Elevated bilirubin which has persisted but remains stable. I suspect possible Gilbert's syndrome, but we will hold off on ordering [id number] as it would not change our plan of care.  9. Episodes of pre-syncope which are unexplained and could be cardiac or medication or hypoglycemia. In view of the possibility of a petit mal seizure or CNS hemorrhage, I will schedule an MRI of the brain.    Plan:      I will schedule an MRI of the brain soon and will call to discuss these results. He will come back to get a CBC in 1 month just to monitor his blood levels. We will plan to see him back in 3 months with a CBC and comprehensive metabolic panel for repeat clinical assessment. The patient understands the plans discussed today and are in agreement with them. They know to contact our office if he develops concerns prior to his next appointment.  I provided 15 minutes of face-to-face time during this this encounter and > 50% was spent counseling as documented under my assessment and plan.      I,Gabriella Ballesteros,acting as a scribe for Derwood Kaplan, MD.,have documented all relevant documentation on the behalf of Derwood Kaplan, MD,as directed by  Derwood Kaplan, MD while in the presence of Derwood Kaplan, MD.

## 2022-10-29 ENCOUNTER — Inpatient Hospital Stay: Payer: 59

## 2022-10-29 DIAGNOSIS — E1169 Type 2 diabetes mellitus with other specified complication: Secondary | ICD-10-CM

## 2022-10-29 DIAGNOSIS — Z79899 Other long term (current) drug therapy: Secondary | ICD-10-CM | POA: Diagnosis not present

## 2022-10-29 DIAGNOSIS — D693 Immune thrombocytopenic purpura: Secondary | ICD-10-CM

## 2022-10-29 DIAGNOSIS — E782 Mixed hyperlipidemia: Secondary | ICD-10-CM

## 2022-10-29 DIAGNOSIS — E559 Vitamin D deficiency, unspecified: Secondary | ICD-10-CM

## 2022-10-29 LAB — CBC WITH DIFFERENTIAL (CANCER CENTER ONLY)
Abs Immature Granulocytes: 0.04 10*3/uL (ref 0.00–0.07)
Basophils Absolute: 0.1 10*3/uL (ref 0.0–0.1)
Basophils Relative: 1 %
Eosinophils Absolute: 0.2 10*3/uL (ref 0.0–0.5)
Eosinophils Relative: 2 %
HCT: 44.7 % (ref 39.0–52.0)
Hemoglobin: 14.9 g/dL (ref 13.0–17.0)
Immature Granulocytes: 1 %
Lymphocytes Relative: 38 %
Lymphs Abs: 3 10*3/uL (ref 0.7–4.0)
MCH: 29.7 pg (ref 26.0–34.0)
MCHC: 33.3 g/dL (ref 30.0–36.0)
MCV: 89 fL (ref 80.0–100.0)
Monocytes Absolute: 0.6 10*3/uL (ref 0.1–1.0)
Monocytes Relative: 8 %
Neutro Abs: 4 10*3/uL (ref 1.7–7.7)
Neutrophils Relative %: 50 %
Platelet Count: 85 10*3/uL — ABNORMAL LOW (ref 150–400)
RBC: 5.02 MIL/uL (ref 4.22–5.81)
RDW: 12.5 % (ref 11.5–15.5)
WBC Count: 7.9 10*3/uL (ref 4.0–10.5)
nRBC: 0 % (ref 0.0–0.2)

## 2022-10-29 LAB — CMP (CANCER CENTER ONLY)
ALT: 32 U/L (ref 0–44)
AST: 24 U/L (ref 15–41)
Albumin: 4.9 g/dL (ref 3.5–5.0)
Alkaline Phosphatase: 65 U/L (ref 38–126)
Anion gap: 9 (ref 5–15)
BUN: 31 mg/dL — ABNORMAL HIGH (ref 6–20)
CO2: 28 mmol/L (ref 22–32)
Calcium: 9.3 mg/dL (ref 8.9–10.3)
Chloride: 100 mmol/L (ref 98–111)
Creatinine: 1.06 mg/dL (ref 0.61–1.24)
GFR, Estimated: >60 mL/min (ref >60)
Glucose, Bld: 107 mg/dL — ABNORMAL HIGH (ref 70–99)
Potassium: 3.8 mmol/L (ref 3.5–5.1)
Sodium: 137 mmol/L (ref 135–145)
Total Bilirubin: 1.3 mg/dL — ABNORMAL HIGH (ref 0.3–1.2)
Total Protein: 7.8 g/dL (ref 6.5–8.1)

## 2022-10-29 LAB — TSH: TSH: 2.96 u[IU]/mL (ref 0.350–4.500)

## 2022-10-29 LAB — LIPID PANEL
Cholesterol: 178 mg/dL (ref 0–200)
HDL: 49 mg/dL (ref >40)
LDL Cholesterol: 114 mg/dL — ABNORMAL HIGH (ref 0–99)
Total CHOL/HDL Ratio: 3.6 RATIO
Triglycerides: 73 mg/dL (ref <150)
VLDL: 15 mg/dL (ref 0–40)

## 2022-10-29 LAB — VITAMIN D 25 HYDROXY (VIT D DEFICIENCY, FRACTURES): Vit D, 25-Hydroxy: 51.45 ng/mL (ref 30–100)

## 2022-10-30 ENCOUNTER — Other Ambulatory Visit: Payer: Self-pay | Admitting: Oncology

## 2022-10-30 ENCOUNTER — Inpatient Hospital Stay (INDEPENDENT_AMBULATORY_CARE_PROVIDER_SITE_OTHER): Payer: 59 | Admitting: Oncology

## 2022-10-30 ENCOUNTER — Encounter: Payer: Self-pay | Admitting: Oncology

## 2022-10-30 VITALS — BP 117/67 | HR 60 | Temp 97.9°F | Resp 16 | Ht 70.3 in | Wt 220.6 lb

## 2022-10-30 DIAGNOSIS — R55 Syncope and collapse: Secondary | ICD-10-CM

## 2022-10-30 DIAGNOSIS — D693 Immune thrombocytopenic purpura: Secondary | ICD-10-CM

## 2022-10-30 HISTORY — DX: Syncope and collapse: R55

## 2022-10-30 LAB — HEMOGLOBIN A1C
Hgb A1c MFr Bld: 5.1 % (ref 4.8–5.6)
Mean Plasma Glucose: 100 mg/dL

## 2022-11-05 ENCOUNTER — Inpatient Hospital Stay: Payer: 59 | Attending: Oncology | Admitting: Licensed Clinical Social Worker

## 2022-11-05 DIAGNOSIS — D693 Immune thrombocytopenic purpura: Secondary | ICD-10-CM | POA: Insufficient documentation

## 2022-11-05 NOTE — Progress Notes (Signed)
[facility] CSW Counseling Note  Patient was referred by medical provider. Treatment type: Individual  Presenting Concerns: Patient and/or family reports the following symptoms/concerns: anxiety, depression, stress, and adjustment concerns Duration of problem: 1.6 years; Severity of problem: moderate   Orientation:oriented to person, place, time/date, situation, day of week, month of year, and year.   Affect: Depressed Risk of harm to self or others: No plan to harm self or others  Patient and/or Family's Strengths/Protective Factors: Social connections, Concrete supports in place (healthy food, safe environments, etc.), Sense of purpose, Physical Health (exercise, healthy diet, medication compliance, etc.), and Caregiver has knowledge of parenting & child developmentAverage or above average intelligence  Capable of independent living  Communication skills  General fund of knowledge  Motivation for treatment/growth  Physical Health  Religious Affiliation  Special hobby/interest  Supportive family/friends  Work skills      Goals Addressed: Patient will:  Reduce symptoms of: depression, stress, and adjustment concerns Increase knowledge and/or ability of: coping skills, healthy habits, self-management skills, and stress reduction  Increase healthy adjustment to current life circumstances, Increase adequate support systems for patient/family, Increase motivation to adhere to plan of care, and Begin healthy grieving over loss   Progress towards Goals: Progressing   Interventions: Interventions utilized:  CBT, Solution Focused, Strength-based, Assertiveness Training, Family Systems, and Reframing      Assessment: Patient currently experiencing  anxiety, depression and stress. Patient continues to be receptive in session and continues experiencing difficulty using skills on a regular basis.  Discussed recent test result and the patient's disappointment with CBC panel. Discussed different  health concerns and the anxiety these cause for the patient.  Discussed different ways to manage anxiety and stress due to results.  Discussed long term coping methods including advocacy with medical providers when patient has questions or concerns, mindfulness and physical activity. Patient is improving in adjustment to life impact due to diagnosis. .  .      Plan: Follow up with CSW: 11/19/2022 Behavioral recommendations: coping skills, self-management skills, and stress  Referral(s): Support group(s):    N/A       Adelene Amas, LCSW

## 2022-11-11 ENCOUNTER — Other Ambulatory Visit: Payer: Self-pay | Admitting: Cardiology

## 2022-11-11 ENCOUNTER — Other Ambulatory Visit: Payer: Self-pay | Admitting: Oncology

## 2022-11-11 DIAGNOSIS — D529 Folate deficiency anemia, unspecified: Secondary | ICD-10-CM

## 2022-11-11 DIAGNOSIS — I10 Essential (primary) hypertension: Secondary | ICD-10-CM

## 2022-11-19 ENCOUNTER — Other Ambulatory Visit: Payer: 59 | Admitting: Licensed Clinical Social Worker

## 2022-11-19 ENCOUNTER — Inpatient Hospital Stay: Payer: 59 | Admitting: Licensed Clinical Social Worker

## 2022-11-19 DIAGNOSIS — D693 Immune thrombocytopenic purpura: Secondary | ICD-10-CM

## 2022-11-19 NOTE — Progress Notes (Signed)
[facility] CSW Counseling Note  Patient was referred by medical provider. Treatment type: Individual  Presenting Concerns: Patient and/or family reports the following symptoms/concerns: anxiety, depression, stress, and adjustment concerns Duration of problem: 1.6 years; Severity of problem: moderate   Orientation:oriented to person, place, time/date, situation, day of week, month of year, and year.   Affect: Depressed Risk of harm to self or others: No plan to harm self or others  Patient and/or Family's Strengths/Protective Factors: Social connections, Concrete supports in place (healthy food, safe environments, etc.), Physical Health (exercise, healthy diet, medication compliance, etc.), Caregiver has knowledge of parenting & child development, and Parental ResilienceActive sense of humor  Average or above average intelligence  Capable of independent living  Tax inspector fund of knowledge  Motivation for treatment/growth  Physical Health  Religious Affiliation  Special hobby/interest  Supportive family/friends  Work skills      Goals Addressed: Patient will:  Reduce symptoms of: anxiety, depression, stress, and adjustment concerns Increase knowledge and/or ability of: coping skills, healthy habits, self-management skills, and stress reduction  Increase healthy adjustment to current life circumstances, Increase adequate support systems for patient/family, Increase motivation to adhere to plan of care, Begin healthy grieving over loss, and communication   Progress towards Goals: Progressing   Interventions: Interventions utilized:  CBT, Solution Focused, Strength-based, Supportive, Family Systems, and Reframing      Assessment: Patient currently experiencing  anxiety, depression and stress. Patient continues to be receptive in session and continues experiencing difficulty using skills on a regular basis. Patient reported an increase in anxiety due to additional paperwork  requests made by the insurance company for his long-term disability. Patient stated, "it's aggravating" to continue requesting paperwork from providers.  Discussed concerns the patient had about the new requests for paperwork and the increase in anxiety and frustration this request creates.  Discussed addressing concerns about physical health and discussing with his providers which type physical exercise would be safest and best for him to do. Discussed long term coping methods including advocacy with medical providers when patient has questions or concerns, mindfulness and physical activity.       Plan: Follow up with CSW: 12/03/2022 @ 9:30 AM Behavioral recommendations: coping skills, self-management skills, and stress  Referral(s): Support group(s):  N/A         Adelene Amas, LCSW

## 2022-11-20 ENCOUNTER — Encounter: Payer: Self-pay | Admitting: Oncology

## 2022-11-28 ENCOUNTER — Inpatient Hospital Stay: Payer: 59

## 2022-11-28 ENCOUNTER — Other Ambulatory Visit: Payer: Self-pay | Admitting: Oncology

## 2022-11-28 DIAGNOSIS — D693 Immune thrombocytopenic purpura: Secondary | ICD-10-CM | POA: Diagnosis present

## 2022-11-28 DIAGNOSIS — R55 Syncope and collapse: Secondary | ICD-10-CM

## 2022-11-28 LAB — CBC WITH DIFFERENTIAL (CANCER CENTER ONLY)
Abs Immature Granulocytes: 0.04 10*3/uL (ref 0.00–0.07)
Basophils Absolute: 0.1 10*3/uL (ref 0.0–0.1)
Basophils Relative: 1 %
Eosinophils Absolute: 0.4 10*3/uL (ref 0.0–0.5)
Eosinophils Relative: 6 %
HCT: 44.7 % (ref 39.0–52.0)
Hemoglobin: 15 g/dL (ref 13.0–17.0)
Immature Granulocytes: 1 %
Lymphocytes Relative: 40 %
Lymphs Abs: 2.9 10*3/uL (ref 0.7–4.0)
MCH: 29.9 pg (ref 26.0–34.0)
MCHC: 33.6 g/dL (ref 30.0–36.0)
MCV: 89.2 fL (ref 80.0–100.0)
Monocytes Absolute: 0.6 10*3/uL (ref 0.1–1.0)
Monocytes Relative: 8 %
Neutro Abs: 3.3 10*3/uL (ref 1.7–7.7)
Neutrophils Relative %: 44 %
Platelet Count: 93 10*3/uL — ABNORMAL LOW (ref 150–400)
RBC: 5.01 MIL/uL (ref 4.22–5.81)
RDW: 12.5 % (ref 11.5–15.5)
WBC Count: 7.3 10*3/uL (ref 4.0–10.5)
nRBC: 0 % (ref 0.0–0.2)

## 2022-12-01 ENCOUNTER — Telehealth: Payer: Self-pay | Admitting: Oncology

## 2022-12-01 NOTE — Telephone Encounter (Signed)
12/01/22 Left msg-MRI SCAN on 12/08/22@1115am .Arrive at [facility] 

## 2022-12-03 ENCOUNTER — Inpatient Hospital Stay: Payer: 59 | Attending: Oncology | Admitting: Licensed Clinical Social Worker

## 2022-12-03 DIAGNOSIS — D693 Immune thrombocytopenic purpura: Secondary | ICD-10-CM

## 2022-12-03 NOTE — Progress Notes (Signed)
[facility] CSW Progress Note  [facility] CSW Counseling Note  Patient was referred by medical provider. Treatment type: Individual  Presenting Concerns: Patient and/or family reports the following symptoms/concerns: anxiety, depression, stress, and adjustment to diagnosis Duration of problem: 1.7 years; Severity of problem: moderate   Orientation:oriented to person, place, time/date, situation, day of week, month of year, and year.   Affect: Depressed Risk of harm to self or others: No plan to harm self or others  Patient and/or Family's Strengths/Protective Factors: Social connections, Social and Emotional competence, Concrete supports in place (healthy food, safe environments, etc.), Physical Health (exercise, healthy diet, medication compliance, etc.), and Caregiver has knowledge of parenting & child developmentAbility for insight  Active sense of humor  Average or above average intelligence  Capable of independent living  Engineer, drilling fund of knowledge  Motivation for treatment/growth  Physical Health  Religious Affiliation  Special hobby/interest  Supportive family/friends  Work skills      Goals Addressed: Patient will:  Reduce symptoms of: anxiety, depression, stress, and adjustment concerns Increase knowledge and/or ability of: coping skills, healthy habits, and self-management skills  Increase healthy adjustment to current life circumstances, Increase adequate support systems for patient/family, Increase motivation to adhere to plan of care, and Begin healthy grieving over loss   Progress towards Goals: Progressing   Interventions: Interventions utilized:  CBT, Solution Focused, Strength-based, Supportive, Family Systems, and Reframing      Assessment: Patient currently experiencing anxiety, depression and stress. Patient continues to be receptive in session and continues experiencing difficulty using skills on a regular basis. Patient  reported an increase in anxiety due to additional paperwork requests made by the insurance company for his long-term disability.  Discussed concerns the patient had about the new requests for paperwork and the increase in anxiety and frustration this request creates.  Discussed addressing concerns about physical health and discussing with his providers which type physical exercise would be safest and best for him to do. Discussed long term coping methods including advocacy with medical providers when patient has questions or concerns, mindfulness and physical activity.   .      Plan: Follow up with CSW: 2 weeks 12/17/2022 @ 9:30AM Behavioral recommendations: coping skills, self-management skills, and stress  Referral(s): Support group(s):  N/A         Adelene Amas, LCSW

## 2022-12-08 ENCOUNTER — Other Ambulatory Visit: Payer: Self-pay

## 2022-12-10 ENCOUNTER — Other Ambulatory Visit (HOSPITAL_COMMUNITY): Payer: Self-pay

## 2022-12-10 ENCOUNTER — Telehealth: Payer: Self-pay

## 2022-12-10 ENCOUNTER — Encounter: Payer: Self-pay | Admitting: Cardiology

## 2022-12-10 ENCOUNTER — Ambulatory Visit: Payer: 59 | Attending: Cardiology | Admitting: Cardiology

## 2022-12-10 VITALS — BP 130/76 | HR 60 | Ht 71.0 in | Wt 223.0 lb

## 2022-12-10 DIAGNOSIS — G4733 Obstructive sleep apnea (adult) (pediatric): Secondary | ICD-10-CM

## 2022-12-10 DIAGNOSIS — E669 Obesity, unspecified: Secondary | ICD-10-CM

## 2022-12-10 DIAGNOSIS — I2510 Atherosclerotic heart disease of native coronary artery without angina pectoris: Secondary | ICD-10-CM

## 2022-12-10 DIAGNOSIS — E782 Mixed hyperlipidemia: Secondary | ICD-10-CM

## 2022-12-10 DIAGNOSIS — I10 Essential (primary) hypertension: Secondary | ICD-10-CM | POA: Diagnosis not present

## 2022-12-10 MED ORDER — NEXLIZET 180-10 MG PO TABS
1.0000 | ORAL_TABLET | Freq: Every day | ORAL | 3 refills | Status: AC
Start: 2022-12-10 — End: ?

## 2022-12-10 NOTE — Telephone Encounter (Signed)
Dr. Tomie China has ordered a new medication called Nexlizet. Wants bloodwork done 12/17/22. Patient has the order for his labs but wants to know if it is ok for him to get them drawn here when he sees Terisitta on 12/17/22. Also would like labs repeat after that in 6 weeks to make sure his platelet are not dropping.   Patient also wanting to know what his MRI brain with contrast showed.

## 2022-12-10 NOTE — Patient Instructions (Addendum)
Medication Instructions:  Your physician has recommended you make the following change in your medication:   Start Nexlizet 1 tablet daily.  *If you need a refill on your cardiac medications before your next appointment, please call your pharmacy*   Lab Work: Your physician recommends that you have have CMP today.  Your physician recommends that you return for lab work in: 6 weeks for CMP and lipids. You need to have labs done when you are fasting.  You can come Monday through Friday 8:30 am to 12:00 pm and 1:15 to 4:30. You do not need to make an appointment as the order has already been placed.    If you have labs (blood work) drawn today and your tests are completely normal, you will receive your results only by: MyChart Message (if you have MyChart) OR A paper copy in the mail If you have any lab test that is abnormal or we need to change your treatment, we will call you to review the results.   Testing/Procedures: None ordered   Follow-Up: At [facility], you and your health needs are our priority.  As part of our continuing mission to provide you with exceptional heart care, we have created designated Provider Care Teams.  These Care Teams include your primary Cardiologist (physician) and Advanced Practice Providers (APPs -  Physician Assistants and Nurse Practitioners) who all work together to provide you with the care you need, when you need it.  We recommend signing up for the patient portal called "MyChart".  Sign up information is provided on this After Visit Summary.  MyChart is used to connect with patients for Virtual Visits (Telemedicine).  Patients are able to view lab/test results, encounter notes, upcoming appointments, etc.  Non-urgent messages can be sent to your provider as well.   To learn more about what you can do with MyChart, go to [email or web address].    Your next appointment:   9 month(s)  The format for your next appointment:   In  Person  Provider:   Belva Crome, MD    Other Instructions none  Important Information About Sugar

## 2022-12-10 NOTE — Telephone Encounter (Signed)
Pharmacy Patient Advocate Encounter   Received notification from [facility] that prior authorization for NEXLIZET is needed.    PA submitted on 12/10/22 Key [id number] Status is pending  Haze Rushing, CPhT Pharmacy Patient Advocate Specialist Direct Number: [phone] Fax: [phone]

## 2022-12-10 NOTE — Telephone Encounter (Signed)
Received a fax from [place] [facility] to [employer] for prior authorization of patient's Nexlizet 180-10 mg at [phone].

## 2022-12-10 NOTE — Progress Notes (Signed)
Cardiology Office Note:    Date:  12/10/2022   ID:  Mike SinnerMichael Joe Lewan, DOB 1971/05/27, MRN [id number]  PCP:  Julianne Handlerouth, Sarah T, NP  Cardiologist:  Garwin Brothersajan R Orel Hord, MD   Referring MD: Julianne Handlerouth, Sarah T, NP    ASSESSMENT:    1. Coronary artery disease involving native coronary artery of native heart without angina pectoris   2. Primary hypertension   3. Obstructive sleep apnea hypopnea, moderate   4. Mixed dyslipidemia   5. Obesity (BMI 30.0-34.9)    PLAN:    In order of problems listed above:  Mild coronary artery disease: Secondary prevention stressed to the patient.  Importance of compliance with diet medication stressed and vocalized understanding.  He was advised to walk at least half an hour 5 days a week and he promises to do so. Mixed dyslipidemia: Patient cannot take statins.  I will initiate Nexlizet.  Will do baseline blood work today.  Follow-up blood work in 6 weeks.  Benefits and potential risks explained and he vocalized understanding and questions were answered to his satisfaction.  He will check with his pharmacist about interactions with other medications that he is on and also about ITB issues. Essential hypertension: Blood pressure stable and diet was emphasized. Obesity and diabetes mellitus: Diet was emphasized.  Lifestyle modification urged and he promises to do better.  Weight reduction stressed risks of obesity explained. Patient will be seen in follow-up appointment in 6 months or earlier if the patient has any concerns.    Medication Adjustments/Labs and Tests Ordered: Current medicines are reviewed at length with the patient today.  Concerns regarding medicines are outlined above.  No orders of the defined types were placed in this encounter.  No orders of the defined types were placed in this encounter.    No chief complaint on file.    History of Present Illness:    Mike [name] is a 52 y.o. male.  Patient has past medical history of coronary  artery disease mild in nature, essential hypertension, mixed dyslipidemia and diabetes mellitus.  He has history of ITP.  He denies any problems at this time and takes care of activities of daily living.  No chest pain orthopnea or PND.  He has sleep apnea.  He was told not to take statins because of ITP issues by his oncologist.  At the time of my evaluation, the patient is alert awake oriented and in no distress.  Past Medical History:  Diagnosis Date   Acute kidney injury    Allergic rhinitis with postnasal drip    Allergy    Anemia    CAD (coronary artery disease) 05/17/2021   Diuretic-induced hypokalemia 05/02/2021   Dyspnea on exertion    Folate deficiency anemia 05/02/2021   GERD (gastroesophageal reflux disease)    GERD without esophagitis    Hyperbilirubinemia 08/03/2019   Hypertension    Hyponatremia    Immune thrombocytopenic purpura    Immune thrombocytopenic purpura    Metabolic syndrome    Mixed dyslipidemia 05/17/2021   Morbid obesity    Obesity (BMI 35.0-39.9 without comorbidity) 04/18/2021   Obstructive sleep apnea hypopnea, moderate    Post-COVID syndrome    Shortness of breath    Sleep apnea    wears a mouth piece   Steroid-induced diabetes    Stress and adjustment reaction    Syncope 10/30/2022   Thrombocytopenia, unspecified     Past Surgical History:  Procedure Laterality Date   NO PAST SURGERIES  Current Medications: Current Meds  Medication Sig   acetaminophen (TYLENOL) 500 MG tablet Take 500 mg by mouth every 4 (four) hours as needed for moderate pain.   albuterol (VENTOLIN HFA) 108 (90 Base) MCG/ACT inhaler Inhale 2 puffs into the lungs every 4 (four) hours as needed for wheezing or shortness of breath.   amLODipine (NORVASC) 10 MG tablet Take 10 mg by mouth daily.   Ascorbic Acid (VITAMIN C) 1000 MG tablet Take 1,000 mg by mouth daily.   azelastine (ASTELIN) 0.1 % nasal spray Place 1 spray into both nostrils every 12 (twelve) hours.    benazepril (LOTENSIN) 20 MG tablet Take 1 tablet (20 mg total) by mouth daily.   busPIRone (BUSPAR) 5 MG tablet Take 5 mg by mouth 2 (two) times daily.   CALCIUM PO Take 1 tablet by mouth daily.   escitalopram (LEXAPRO) 20 MG tablet Take 20 mg by mouth daily.   folic acid (FOLVITE) 1 MG tablet TAKE ONE TABLET BY MOUTH ONCE DAILY   furosemide (LASIX) 40 MG tablet Take 1 tablet (40 mg total) by mouth daily.   glipiZIDE (GLUCOTROL XL) 10 MG 24 hr tablet Take 10 mg by mouth daily.   metFORMIN (GLUCOPHAGE-XR) 500 MG 24 hr tablet Take 1,000 mg by mouth 2 (two) times daily.   metoprolol succinate (TOPROL-XL) 100 MG 24 hr tablet Take 1 tablet (100 mg total) by mouth daily. Take with or immediately following a meal.   NON FORMULARY Take 700 mg by mouth daily. Apple pectin    omeprazole (PRILOSEC) 20 MG capsule Take 20 mg by mouth daily.   oxyCODONE (OXY IR/ROXICODONE) 5 MG immediate release tablet Take 5 mg by mouth every 6 (six) hours as needed for pain.   potassium chloride SA (KLOR-CON M) 20 MEQ tablet Take 2 tablets (40 mEq total) by mouth 3 (three) times daily.   predniSONE (DELTASONE) 5 MG tablet Take 1 tablet (5 mg total) by mouth daily with breakfast.   pregabalin (LYRICA) 25 MG capsule Take 25-50 mg by mouth at bedtime. Have not started yet   vitamin B-12 (CYANOCOBALAMIN) 1000 MCG tablet Take 1,000 mcg by mouth daily.   vitamin k 100 MCG tablet Take 100 mcg by mouth daily.     Allergies:   Patient has no known allergies.   Social History   Socioeconomic History   Marital status: Divorced    Spouse name: Not on file   Number of children: 1   Years of education: Not on file   Highest education level: Not on file  Occupational History   Not on file  Tobacco Use   Smoking status: Never   Smokeless tobacco: Former  Substance and Sexual Activity   Alcohol use: Not Currently   Drug use: Not Currently   Sexual activity: Not Currently  Other Topics Concern   Not on file  Social History  Narrative   Not on file   Social Determinants of Health   Financial Resource Strain: Medium Risk (10/22/2022)   Overall Financial Resource Strain (CARDIA)    Difficulty of Paying Living Expenses: Somewhat hard  Food Insecurity: No Food Insecurity (10/22/2022)   Hunger Vital Sign    Worried About Running Out of Food in the Last Year: Never true    Ran Out of Food in the Last Year: Never true  Transportation Needs: No Transportation Needs (04/29/2022)   PRAPARE - Administrator, Civil Service (Medical): No    Lack of Transportation (Non-Medical): No  Physical Activity: Sufficiently Active (10/22/2022)   Exercise Vital Sign    Days of Exercise per Week: 5 days    Minutes of Exercise per Session: 60 min  Stress: Stress Concern Present (10/22/2022)   [employer] of Occupational Health - Occupational Stress Questionnaire    Feeling of Stress : To some extent  Social Connections: Moderately Integrated (10/22/2022)   Social Connection and Isolation Panel [NHANES]    Frequency of Communication with Friends and Family: More than three times a week    Frequency of Social Gatherings with Friends and Family: More than three times a week    Attends Religious Services: 1 to 4 times per year    Active Member of [employer] or Organizations: Yes    Attends Banker Meetings: Never    Marital Status: Divorced     Family History: The patient's family history includes Breast cancer in his maternal grandmother; Heart attack in his father, paternal grandfather, and paternal uncle; Thrombosis in his maternal aunt, maternal grandmother, and maternal uncle.  ROS:   Please see the history of present illness.    All other systems reviewed and are negative.  EKGs/Labs/Other Studies Reviewed:    The following studies were reviewed today: EKG sinus rhythm poor anterior forces and nonspecific ST-T changes   Recent Labs: 10/29/2022: ALT 32; BUN 31; Creatinine 1.06; Potassium 3.8;  Sodium 137; TSH 2.960 11/28/2022: Hemoglobin 15.0; Platelet Count 93  Recent Lipid Panel    Component Value Date/Time   CHOL 178 10/29/2022 0856   TRIG 73 10/29/2022 0856   HDL 49 10/29/2022 0856   CHOLHDL 3.6 10/29/2022 0856   VLDL 15 10/29/2022 0856   LDLCALC 114 (H) 10/29/2022 0856    Physical Exam:    VS:  BP 130/76   Pulse 60   Ht 5\' 11"  (1.803 m)   Wt 223 lb (101.2 kg)   SpO2 99%   BMI 31.10 kg/m     Wt Readings from Last 3 Encounters:  12/10/22 223 lb (101.2 kg)  10/30/22 220 lb 9.6 oz (100.1 kg)  07/30/22 219 lb 4.8 oz (99.5 kg)     GEN: Patient is in no acute distress HEENT: Normal NECK: No JVD; No carotid bruits LYMPHATICS: No lymphadenopathy CARDIAC: Hear sounds regular, 2/6 systolic murmur at the apex. RESPIRATORY:  Clear to auscultation without rales, wheezing or rhonchi  ABDOMEN: Soft, non-tender, non-distended MUSCULOSKELETAL:  No edema; No deformity  SKIN: Warm and dry NEUROLOGIC:  Alert and oriented x 3 PSYCHIATRIC:  Normal affect   Signed, Garwin Brothers, MD  12/10/2022 9:29 AM    [facility]

## 2022-12-11 NOTE — Telephone Encounter (Signed)
Pt's baseline LDL is 114, checked on 10/29/22. He is statin intolerant. His LDL goal is < 70 due to history of CAD. Ezetimibe is not clinically appropriate to make pt try first as it only lowers LDL cholesterol by 20%, which would drop his LDL to ~91. This would be suboptimal care as it would not bring his LDL to goal and would just delay the need for further/more aggressive lipid lowering management. He requires Nexlizet which is more effective and would drop his LDL by 40%. Nexlizet contains ezetimibe as well so it doesn't make much sense to require him to try ezetimibe on its own when we are trying to start him on a product that contains ezetimibe already.

## 2022-12-11 NOTE — Telephone Encounter (Signed)
Pharmacy Patient Advocate Encounter  Received notification from [facility] that the request for prior authorization for NEXLIZET has been denied due to PLAN REQUIRES 12 WEEKS OF ZETIA THERAPY W/STATIN OR DOCUMENTATION STATING PT HAS A CONTRADICTION TO ZETIA    (See full denial on chart media)    [facility], PLEASE ADVISE  Haze Rushing, CPhT Pharmacy Patient Advocate Specialist Direct Number: [phone] Fax: [phone]

## 2022-12-11 NOTE — Telephone Encounter (Signed)
Thank you Aundra Millet! I laughed at the denial because it says try zetia first and you're literally asking for a product with zetia. Gotta love it.  Appeal has been faxed.

## 2022-12-12 ENCOUNTER — Other Ambulatory Visit: Payer: Self-pay | Admitting: Oncology

## 2022-12-12 DIAGNOSIS — D693 Immune thrombocytopenic purpura: Secondary | ICD-10-CM

## 2022-12-17 ENCOUNTER — Telehealth: Payer: Self-pay

## 2022-12-17 ENCOUNTER — Inpatient Hospital Stay: Payer: 59 | Admitting: Licensed Clinical Social Worker

## 2022-12-17 ENCOUNTER — Inpatient Hospital Stay: Payer: 59

## 2022-12-17 DIAGNOSIS — D693 Immune thrombocytopenic purpura: Secondary | ICD-10-CM | POA: Diagnosis not present

## 2022-12-17 LAB — CBC WITH DIFFERENTIAL (CANCER CENTER ONLY)
Abs Immature Granulocytes: 0.05 10*3/uL (ref 0.00–0.07)
Basophils Absolute: 0.1 10*3/uL (ref 0.0–0.1)
Basophils Relative: 1 %
Eosinophils Absolute: 0.4 10*3/uL (ref 0.0–0.5)
Eosinophils Relative: 5 %
HCT: 44.6 % (ref 39.0–52.0)
Hemoglobin: 15.1 g/dL (ref 13.0–17.0)
Immature Granulocytes: 1 %
Lymphocytes Relative: 29 %
Lymphs Abs: 2.4 10*3/uL (ref 0.7–4.0)
MCH: 29.5 pg (ref 26.0–34.0)
MCHC: 33.9 g/dL (ref 30.0–36.0)
MCV: 87.3 fL (ref 80.0–100.0)
Monocytes Absolute: 0.7 10*3/uL (ref 0.1–1.0)
Monocytes Relative: 8 %
Neutro Abs: 4.8 10*3/uL (ref 1.7–7.7)
Neutrophils Relative %: 56 %
Platelet Count: 97 10*3/uL — ABNORMAL LOW (ref 150–400)
RBC: 5.11 MIL/uL (ref 4.22–5.81)
RDW: 12.2 % (ref 11.5–15.5)
WBC Count: 8.4 10*3/uL (ref 4.0–10.5)
nRBC: 0 % (ref 0.0–0.2)

## 2022-12-17 LAB — CMP (CANCER CENTER ONLY)
ALT: 35 U/L (ref 0–44)
AST: 33 U/L (ref 15–41)
Albumin: 4.4 g/dL (ref 3.5–5.0)
Alkaline Phosphatase: 66 U/L (ref 38–126)
Anion gap: 11 (ref 5–15)
BUN: 27 mg/dL — ABNORMAL HIGH (ref 6–20)
CO2: 27 mmol/L (ref 22–32)
Calcium: 9 mg/dL (ref 8.9–10.3)
Chloride: 98 mmol/L (ref 98–111)
Creatinine: 0.89 mg/dL (ref 0.61–1.24)
GFR, Estimated: >60 mL/min (ref >60)
Glucose, Bld: 109 mg/dL — ABNORMAL HIGH (ref 70–99)
Potassium: 3.4 mmol/L — ABNORMAL LOW (ref 3.5–5.1)
Sodium: 136 mmol/L (ref 135–145)
Total Bilirubin: 1.8 mg/dL — ABNORMAL HIGH (ref 0.3–1.2)
Total Protein: 7.6 g/dL (ref 6.5–8.1)

## 2022-12-17 NOTE — Telephone Encounter (Signed)
Attempted to contact patient. No answer. 

## 2022-12-17 NOTE — Progress Notes (Signed)
[facility] CSW Counseling Note  Patient was referred by medical provider. Treatment type: Individual  Presenting Concerns: Patient and/or family reports the following symptoms/concerns: anxiety, depression, stress, and adjustment to diagnosis Duration of problem: 1.7 years; Severity of problem: moderate   Orientation:oriented to person, place, time/date, situation, day of week, month of year, and year.   Affect: Flat Risk of harm to self or others: No plan to harm self or others  Patient and/or Family's Strengths/Protective Factors: Social connections, Social and Emotional competence, Concrete supports in place (healthy food, safe environments, etc.), Physical Health (exercise, healthy diet, medication compliance, etc.), and Caregiver has knowledge of parenting & child developmentAverage or above average intelligence  Capable of independent living  Education administrator  Religious Affiliation  Special hobby/interest  Supportive family/friends  Work skills      Goals Addressed: Patient will:  Reduce symptoms of: anxiety, depression, stress, and adjustment concerns Increase knowledge and/or ability of: coping skills, healthy habits, self-management skills, and stress reduction  Increase healthy adjustment to current life circumstances, Increase adequate support systems for patient/family, Increase motivation to adhere to plan of care, Improve medication compliance, and Begin healthy grieving over loss   Progress towards Goals: Progressing   Interventions: Interventions utilized:  CBT, Solution Focused, Strength-based, Assertiveness Training, Supportive, and Reframing      Assessment: Patient currently experiencing anxiety, depression and stress. Patient continues to be receptive in session and continues experiencing difficulty using skills on a regular basis. Patient reported recent appointment with cardiologist and discussed the new medication prescription. Discussed  concerns the patient has about the new medication and possible side effects.  Discussed addressing concerns about physical health and discussing with his providers about her sleep apnea and getting the sleep apnea machine. Discussed long term coping methods including advocacy with medical providers when patient has questions or concerns, mindfulness and physical activity. .      Plan: Follow up with CSW: 2 weeks May 1st, 2024 @ 9:30am Behavioral recommendations: coping skills, self-management skills, and stress  Referral(s): Support group(s):  N/A         Joseph Art, LCSW   Patient is participating in a Managed Medicaid Plan:  Yes

## 2022-12-17 NOTE — Telephone Encounter (Signed)
-----   Message from Dellia Beckwith, MD sent at 12/17/2022  1:54 PM EDT ----- Regarding: FW: lab Tell him platelets a little better at 97,000, BS 109, good. K is a little low at 3.4,try to increase in his diet ----- Message ----- From: Dellia Beckwith, MD Sent: 12/17/2022  10:10 AM EDT To: Dellia Beckwith, MD; Jeannette Corpus, LPN Subject: lab                                            He has labs scheduled for today, pls look for them, not sure which lab

## 2022-12-18 ENCOUNTER — Other Ambulatory Visit (HOSPITAL_COMMUNITY): Payer: Self-pay

## 2022-12-18 ENCOUNTER — Encounter: Payer: Self-pay | Admitting: Oncology

## 2022-12-22 ENCOUNTER — Telehealth: Payer: Self-pay

## 2022-12-22 ENCOUNTER — Other Ambulatory Visit (HOSPITAL_COMMUNITY): Payer: Self-pay

## 2022-12-22 NOTE — Telephone Encounter (Signed)
Pharmacy Patient Advocate Encounter  Prior Authorization for NEXLIZET has been approved.    Never got a determination on appeal via fax but per test claim approval is on file.   Pharmacy filled 12/15/22, insurance will pay again on 02/21/23  Haze Rushing, CPhT Pharmacy Patient Advocate Specialist Direct Number: [phone] Fax: [phone]

## 2022-12-22 NOTE — Telephone Encounter (Signed)
-----   Message from Christine H McCarty, MD sent at 12/17/2022  1:54 PM EDT ----- Regarding: FW: lab Tell him platelets a little better at 97,000, BS 109, good. K is a little low at 3.4,try to increase in his diet ----- Message ----- From: McCarty, Christine H, MD Sent: 12/17/2022  10:10 AM EDT To: Christine H McCarty, MD; Stevana Dufner M Midge Momon, LPN Subject: lab                                            He has labs scheduled for today, pls look for them, not sure which lab   

## 2022-12-22 NOTE — Telephone Encounter (Signed)
Please see 12/10/22 encounter. This is pending appeal approval.

## 2022-12-30 ENCOUNTER — Other Ambulatory Visit: Payer: Self-pay | Admitting: Cardiology

## 2022-12-31 ENCOUNTER — Inpatient Hospital Stay: Payer: Medicaid Other | Attending: Oncology | Admitting: Licensed Clinical Social Worker

## 2022-12-31 DIAGNOSIS — D693 Immune thrombocytopenic purpura: Secondary | ICD-10-CM

## 2022-12-31 NOTE — Progress Notes (Signed)
[facility] CSW Counseling Note  Patient was referred by medical provider. Treatment type: Individual  Presenting Concerns: Patient and/or family reports the following symptoms/concerns: anxiety, depression, and stress and adjustment to diagnosis Duration of problem: 1.7 years; Severity of problem: moderate   Orientation:oriented to person, place, time/date, situation, day of week, month of year, and year.   Affect: Flat Risk of harm to self or others: No plan to harm self or others  Patient and/or Family's Strengths/Protective Factors: Social connections, Social and Emotional competence, Concrete supports in place (healthy food, safe environments, etc.), Sense of purpose, Physical Health (exercise, healthy diet, medication compliance, etc.), and Caregiver has knowledge of parenting & child developmentAverage or above average intelligence  Capable of independent living  Communication skills  General fund of knowledge  Motivation for treatment/growth  Physical Health  Religious Affiliation  Special hobby/interest  Supportive family/friends  Work skills      Goals Addressed: Patient will:  Reduce symptoms of: anxiety, depression, stress, and adjustment concerns Increase knowledge and/or ability of: coping skills, healthy habits, self-management skills, and stress reduction  Increase healthy adjustment to current life circumstances, Increase adequate support systems for patient/family, Increase motivation to adhere to plan of care, Improve medication compliance, Decrease self-medicating behaviors, and Begin healthy grieving over loss   Progress towards Goals: Progressing   Interventions: Interventions utilized:  CBT, Solution Focused, Strength-based, Supportive, and Reframing      Assessment: Patient currently experiencing anxiety, depression and stress. Patient continues to be receptive in session and continues experiencing difficulty using skills on a regular basis. Patient reported  recent appointment with cardiologist and discussed the new medication prescription. Discussed concerns the patient has about the new medication and possible side effects.  Discussed addressing concerns about physical health and discussing with his providers about her sleep apnea and getting the sleep apnea machine. Discussed long term coping methods including advocacy with medical providers when patient has questions or concerns, mindfulness and physical activity. .  .      Plan: Follow up with CSW: 2 weeks May 15th, 2024 Behavioral recommendations: coping skills, self-management skills, and stress  Referral(s): Support group(s):  N/A         Joseph Art, LCSW   Patient is participating in a Managed Medicaid Plan:  Yes

## 2023-01-14 ENCOUNTER — Other Ambulatory Visit: Payer: 59 | Admitting: Licensed Clinical Social Worker

## 2023-01-14 ENCOUNTER — Inpatient Hospital Stay: Payer: Medicaid Other | Admitting: Licensed Clinical Social Worker

## 2023-01-14 DIAGNOSIS — D693 Immune thrombocytopenic purpura: Secondary | ICD-10-CM

## 2023-01-14 NOTE — Progress Notes (Signed)
[facility] CSW Counseling Note  Patient was referred by medical provider. Treatment type: Individual  Presenting Concerns: Patient and/or family reports the following symptoms/concerns: anxiety, depression, and adjustment to diagnosis Duration of problem: 7.8 years; Severity of problem: moderate   Orientation:oriented to person, place, time/date, situation, day of week, month of year, and year.   Affect: Depressed Risk of harm to self or others: No plan to harm self or others  Patient and/or Family's Strengths/Protective Factors: Social connections, Concrete supports in place (healthy food, safe environments, etc.), Sense of purpose, Physical Health (exercise, healthy diet, medication compliance, etc.), Caregiver has knowledge of parenting & child development, and Parental ResilienceActive sense of humor  Average or above average intelligence  Capable of independent living  Arboriculturist fund of knowledge  Motivation for treatment/growth  Physical Health  Religious Affiliation  Special hobby/interest  Supportive family/friends  Work skills      Goals Addressed: Patient will:  Reduce symptoms of: anxiety, depression, and adjustment concerns Increase knowledge and/or ability of: coping skills, healthy habits, self-management skills, stress reduction, and communication   Increase healthy adjustment to current life circumstances, Increase adequate support systems for patient/family, Increase motivation to adhere to plan of care, Improve medication compliance, and Begin healthy grieving over loss   Progress towards Goals: Progressing   Interventions: Interventions utilized:  CBT, Solution Focused, Strength-based, and Reframing      Assessment: Patient currently experiencing anxiety, depression and stress. Patient continues to be receptive in session and continues experiencing difficulty using skills on a regular basis. Patient reports he is doing well  and is finding improvement with PT.  Discussed continuing the exercises everyday. Discussed possibly starting classes in the summer. Discussed long term coping methods including advocacy with medical providers when patient has questions or concerns, mindfulness and physical activity.       Plan: Follow up with CSW: 2 weeks 01/28/2023 @ 9:30 Behavioral recommendations: coping skills, self-management skills, and stress  Referral(s): Support group(s):  N/A         Joseph Art, LCSW

## 2023-01-22 NOTE — Progress Notes (Signed)
[facility]  [address] [place],  [place]  [address] [phone]  Clinic Day:  01/27/23  Referring physician: Julianne Handler, NP  CHIEF COMPLAINT:  CC:  Immune thrombocytopenic purpura, chronic  Current Treatment:   Prednisone 5 mg daily  HISTORY OF PRESENT ILLNESS:  Mike [name] is a 52 y.o. male with a history of severe thrombocytopenia, felt to represent immune thrombocytopenic purpura.  He has had thrombocytopenia, which has fluctuated up and down since June 2020.  Further laboratory evaluation did not reveal any other specific etiology for his thrombocytopenia.  He had mildly elevated liver transaminases.  Abdominal ultrasound revealed increased echogenicity of hepatic parenchyma suggesting hepatic steatosis, with probable fatty sparing adjacent to gallbladder fossa.  No other abnormality was seen.  We recommended bone marrow examination, but the patient and his mother declined.  The platelet count continued to fluctuate up and down, but as his platelet count remained 25,000 in June 2021, he was started on prednisone 20 mg 3 times daily, with a good response.  He has had leukocytosis secondary to the high-dose prednisone.  He also has had oral candidiasis on occasion. Mike [name] titers were elevated, so this may be representative of chronic fatigue syndrome. We decreased his prednisone to 45 mg in late September and platelets remained stable at 97,000.  Since we were unable to taper him off the prednisone he was treated with rituximab weekly for 4 weeks in October and November 2021.  We had been slowly tapering the prednisone.  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  He has also had hypokalemia resolved with oral supplementation.    On May 18th, his platelet count was 151,000, so I decreased his prednisone to 5 mg daily. He was admitted with COVID-19 on May 24th after leaving work earlier due  to altered mental status and generalized weakness. He had acute kidney injury with a creatinine of 2.2, which normalized with IV hydration. He had fever, but no respiratory symptoms. He was discharged on a prolonged prednisone taper over 15 weeks.  He had been having some chest pain and dyspnea since discontinuing indapamide, so was referred to Dr. Tomie China in Cardiology. He placed him on furosemide 40 mg daily and metoprolol 50 mg daily.  He had had problems with hypokalemia for a long time but this worsened with the diuretic.  He also had him increase his potassium to 40 mg twice daily.  He states his lower extremity edema has resolved with the furosemide.  The ECHO shows left ventricular hypertrophy with an EF between 55-60%, and the CT of the coronary arteries looks good.  He is currently on gabapentin 300 mg BID.  He was on a extremely slow taper of the prednisone, but his platelets dropped to 73,000 when the dose was dropped to 5 mg three times per week.  I increased it back to 5 mg daily and stopped the gabapentin as it wasn't helping.  MRI of the lumbar spine from October 27th revealed degenerative changes of the lumbar spine most prominent L4-5.  INTERVAL HISTORY:  Mike [name] is here today for routine follow up for chronic immune thrombocytopenic purpura. Patient states that he feels about the same and complains of back and right shoulder pain. He now takes physical therapy. He continues Prednisone 5mg  daily.  His WBC is 8.1, hemoglobin 15.0, and platelet count decreased from 97,000 to 83,000 as of today. His CMP is pending. His referral  to the neurosurgeon was resent again this morning since they never got it last month. We would like their opinion as to whether any further imaging or intervention is needed for his abnormal brain MRI scans. He has not had any more episodes as he described last month of near syncope. His MRI of the head on 12/08/2022 revealed asymmetric ventricles, specifically the left  frontal horn with enlargement with unknown etiology. This could be congenital or could represent possible hydrocephalus. A repeat scan was suggested in 6 months, which would be in October, to see if there is any difference. I will see him back in 1 month with CBC and CMP. He denies signs of infection such as sore throat, sinus drainage or urinary symptoms.  He denies fevers or recurrent chills. He denies pain. He denies nausea, vomiting, chest pain, dyspnea or cough. His appetite is good and his weight has increased 2 pounds over last month .  REVIEW OF SYSTEMS:  Review of Systems  Constitutional: Negative.  Negative for appetite change, chills, diaphoresis, fatigue, fever and unexpected weight change.  HENT:  Negative.  Negative for hearing loss, lump/mass, mouth sores, nosebleeds, sore throat, tinnitus, trouble swallowing and voice change.   Eyes: Negative.  Negative for eye problems and icterus.  Respiratory: Negative.  Negative for chest tightness, cough, hemoptysis, shortness of breath and wheezing.   Cardiovascular: Negative.  Negative for chest pain, leg swelling and palpitations.  Gastrointestinal: Negative.  Negative for abdominal distention, abdominal pain, blood in stool, constipation, diarrhea, nausea, rectal pain and vomiting.  Endocrine: Negative.   Genitourinary: Negative.  Negative for bladder incontinence, difficulty urinating, dyspareunia, dysuria, frequency, hematuria, nocturia, pelvic pain and penile discharge.   Musculoskeletal:  Positive for arthralgias (of the left hip, right shoulder) and back pain (chronic). Negative for flank pain, gait problem, myalgias, neck pain and neck stiffness.  Skin: Negative.  Negative for itching, rash and wound.  Neurological:  Negative for dizziness, extremity weakness, gait problem, headaches, light-headedness, numbness, seizures and speech difficulty.  Hematological: Negative.  Negative for adenopathy. Does not bruise/bleed easily.   Psychiatric/Behavioral:  Negative for confusion, decreased concentration, depression, sleep disturbance and suicidal ideas. The patient is nervous/anxious.   All other systems reviewed and are negative.   VITALS:  Blood pressure 129/66, pulse (!) 57, temperature 98.2 F (36.8 C), temperature source Oral, resp. rate 18, height 5\' 11"  (1.803 m), weight 225 lb 11.2 oz (102.4 kg), SpO2 100 %.  Wt Readings from Last 3 Encounters:  01/27/23 225 lb 11.2 oz (102.4 kg)  12/10/22 223 lb (101.2 kg)  10/30/22 220 lb 9.6 oz (100.1 kg)    Body mass index is 31.48 kg/m.  Performance status (ECOG): 1 - Symptomatic but completely ambulatory  PHYSICAL EXAM:  Physical Exam Vitals and nursing note reviewed.  Constitutional:      General: He is not in acute distress.    Appearance: Normal appearance. He is normal weight. He is not ill-appearing, toxic-appearing or diaphoretic.  HENT:     Head: Normocephalic and atraumatic.     Right Ear: Tympanic membrane, ear canal and external ear normal. There is no impacted cerumen.     Left Ear: Tympanic membrane, ear canal and external ear normal. There is no impacted cerumen.     Nose: Nose normal. No congestion or rhinorrhea.     Mouth/Throat:     Mouth: Mucous membranes are moist.     Pharynx: Oropharynx is clear. No oropharyngeal exudate or posterior oropharyngeal erythema.  Eyes:  General: No scleral icterus.       Right eye: No discharge.        Left eye: No discharge.     Extraocular Movements: Extraocular movements intact.     Conjunctiva/sclera: Conjunctivae normal.     Pupils: Pupils are equal, round, and reactive to light.  Neck:     Vascular: No carotid bruit.  Cardiovascular:     Rate and Rhythm: Normal rate and regular rhythm.     Pulses: Normal pulses.     Heart sounds: Normal heart sounds. No murmur heard.    No friction rub. No gallop.  Pulmonary:     Effort: Pulmonary effort is normal. No respiratory distress.     Breath sounds:  Normal breath sounds. No stridor. No wheezing, rhonchi or rales.  Chest:     Chest wall: No tenderness.  Abdominal:     General: Bowel sounds are normal. There is no distension.     Palpations: Abdomen is soft. There is no hepatomegaly, splenomegaly or mass.     Tenderness: There is no abdominal tenderness. There is no right CVA tenderness, left CVA tenderness, guarding or rebound.     Hernia: No hernia is present.  Musculoskeletal:        General: Normal range of motion.     Cervical back: Normal range of motion and neck supple. No rigidity or tenderness.     Right lower leg: No edema.     Left lower leg: No edema.  Lymphadenopathy:     Cervical: No cervical adenopathy.     Upper Body:     Right upper body: No supraclavicular or axillary adenopathy.     Left upper body: Axillary adenopathy present. No supraclavicular adenopathy.     Lower Body: No right inguinal adenopathy. No left inguinal adenopathy.     Comments: Fullness in the left axilla that is non-specific and could possibly represent a lymph node we will plan to follow closely.   Skin:    General: Skin is warm and dry.     Coloration: Skin is not jaundiced.     Findings: No rash.  Neurological:     General: No focal deficit present.     Mental Status: He is alert and oriented to person, place, and time. Mental status is at baseline.     Cranial Nerves: No cranial nerve deficit.  Psychiatric:        Mood and Affect: Mood normal.        Behavior: Behavior normal.        Thought Content: Thought content normal.        Judgment: Judgment normal.    LABS:      Latest Ref Rng & Units 01/27/2023   12:00 AM 12/17/2022    8:57 AM 11/28/2022    8:33 AM  CBC  WBC  8.1     8.4  7.3   Hemoglobin 13.5 - 17.5 15.0     15.1  15.0   Hematocrit 41 - 53 43     44.6  44.7   Platelets 150 - 400 K/uL 83     97  93      This result is from an external source.      Latest Ref Rng & Units 01/27/2023   12:00 AM 12/17/2022    8:57 AM  10/29/2022    8:57 AM  CMP  Glucose 70 - 99 mg/dL  161  096   BUN 4 - 21 27  27  31   Creatinine 0.6 - 1.3 1.0     0.89  1.06   Sodium 137 - 147 140     136  137   Potassium 3.5 - 5.1 mEq/L 4.1     3.4  3.8   Chloride 99 - 108 102     98  100   CO2 13 - 22 25     27  28    Calcium 8.7 - 10.7 9.7     9.0  9.3   Total Protein 6.5 - 8.1 g/dL  7.6  7.8   Total Bilirubin 0.3 - 1.2 mg/dL  1.8  1.3   Alkaline Phos 25 - 125 65     66  65   AST 14 - 40 37     33  24   ALT 10 - 40 U/L 48     35  32      This result is from an external source.    STUDIES:       HISTORY:   Allergies: No Known Allergies  Current Medications: Current Outpatient Medications  Medication Sig Dispense Refill   acetaminophen (TYLENOL) 500 MG tablet Take 500 mg by mouth every 4 (four) hours as needed for moderate pain.     albuterol (VENTOLIN HFA) 108 (90 Base) MCG/ACT inhaler Inhale 2 puffs into the lungs every 4 (four) hours as needed for wheezing or shortness of breath.     amLODipine (NORVASC) 10 MG tablet Take 10 mg by mouth daily.     Ascorbic Acid (VITAMIN C) 1000 MG tablet Take 1,000 mg by mouth daily.     azelastine (ASTELIN) 0.1 % nasal spray Place 1 spray into both nostrils every 12 (twelve) hours.     Bempedoic Acid-Ezetimibe (NEXLIZET) 180-10 MG TABS Take 1 tablet by mouth daily in the afternoon. 90 tablet 3   benazepril (LOTENSIN) 20 MG tablet Take 1 tablet (20 mg total) by mouth daily. 90 tablet 3   busPIRone (BUSPAR) 5 MG tablet Take 5 mg by mouth 2 (two) times daily.     CALCIUM PO Take 1 tablet by mouth daily.     escitalopram (LEXAPRO) 20 MG tablet Take 20 mg by mouth daily.     folic acid (FOLVITE) 1 MG tablet TAKE ONE TABLET BY MOUTH ONCE DAILY 90 tablet 3   furosemide (LASIX) 40 MG tablet Take 1 tablet (40 mg total) by mouth daily. 30 tablet 0   glipiZIDE (GLUCOTROL XL) 10 MG 24 hr tablet Take 10 mg by mouth daily.     metFORMIN (GLUCOPHAGE-XR) 500 MG 24 hr tablet Take 1,000 mg by  mouth 2 (two) times daily.     metoprolol succinate (TOPROL-XL) 100 MG 24 hr tablet Take 1 tablet (100 mg total) by mouth daily. Take with or immediately following a meal. 90 tablet 3   NON FORMULARY Take 700 mg by mouth daily. Apple pectin      omeprazole (PRILOSEC) 20 MG capsule Take 20 mg by mouth daily.     oxyCODONE (OXY IR/ROXICODONE) 5 MG immediate release tablet Take 5 mg by mouth every 6 (six) hours as needed for pain.     potassium chloride SA (KLOR-CON M) 20 MEQ tablet Take 2 tablets (40 mEq total) by mouth 3 (three) times daily. 180 tablet 5   predniSONE (DELTASONE) 5 MG tablet Take 1 tablet (5 mg total) by mouth daily with breakfast. 90 tablet 1   pregabalin (LYRICA) 25 MG capsule Take 25-50  mg by mouth at bedtime. Have not started yet     vitamin B-12 (CYANOCOBALAMIN) 1000 MCG tablet Take 1,000 mcg by mouth daily.     vitamin k 100 MCG tablet Take 100 mcg by mouth daily.     No current facility-administered medications for this visit.    ASSESSMENT & PLAN:  Assessment:  1. ITP: Significant thrombocytopenia, which has been fluctuating up and down since June 2020.  This is chronic immune thrombocytopenic purpura.  We placed him on prednisone 20 mg three times daily in early June 2020.  We were unable to taper his steroids, so he underwent rituximab therapy weekly for 4 weeks in October 2021 with a partial response.  He is not a good candidate for splenectomy unless absolutely necessary.  His platelet count had remained above 75,000, we have steadily tapered his prednisone and prior to his hospital admission in May 2022, he was on prednisone 5mg  daily. He was admitted for COVID and placed back on high dose prednisone, with an extremely slow taper, down to 2.5 mg daily.  His platelet count worsened in August 2022, so we increased the dose back to 5 mg daily, but he has not normalized, so remains on this dose.  His platelet count continues to fluctuate up and down for the last two years and  today dropped to 83,000. I cannot explain this but it has been going up and down like this for years.    2. Steroid induced diabetes, for which he continues metformin 500 mg twice daily and glipizide 5 mg daily.     3.  Elevated Epstein Barr titers.  This may be indicative of chronic fatigue syndrome, and I advised that he rest as needed.  4.  Sleep apnea, untreated. I have encouraged him to use his CPAP mask for better quality of life and to reduce his daytime sleepiness.  He has been referred to  Pulmonary.   5.  Chronic anxiety, mainly related to his health, and also to his job and insurance issues. I encouraged him to start the Buspar 5 mg. We did have him seeing our LCSW for counseling.   6.  Hospital admission with acute kidney injury due to dehydration associated with COVID-19, May 2022.   He was placed on an extremely slow prednisone taper.    7.  Significant left hip and lower back pain due to osteoarthritis and degenerative disc disease.  He is following with a spine specialist.  He is undergoing physical therapy. MRI of the lumbar spine revealed degenerative changes of L4-5. His surgeon does not feel comfortable doing any invasive procedure or surgery with his persistent thrombocytopenia.  8.  Elevated bilirubin which has persisted but remains stable. I suspect possible Gilbert's syndrome, but we will hold off on ordering [id number] as it would not change our plan of care.  9. Episodes of pre-syncope which are unexplained and could be cardiac or medication or hypoglycemia. In view of the possibility of a petit mal seizure or CNS hemorrhage. His MRI of the head on 12/08/2022 shows abnormal size and shape of the ventricles with asymmetric enlargement of the left frontal horn. We don't know if this is congenital or whether it represents hydrocephalus.  A repeat scan was suggested in 6 months in October to see if there is any difference.   Plan:     He now takes physical therapy. He continues  Prednisone 5mg  daily.  His WBC is 8.1, hemoglobin at 15.0, and platelet count decreased  from 97,000 to 83,000 as of today. His CMP is pending. His referral to the neurosurgeon was resent again this morning since they never got it last month. We would like their opinion as to whether any further imaging or intervention is needed. He has not had any more episodes as he described last month of near syncope. His MRI of the head on 12/08/2022 revealed asymmetric ventricles, specifically the left frontal horn with enlargement with unknown etiology. This could be congenital or could represent possible hydrocephalus. A repeat scan was suggested in 6 months, which would be in October, to see if there is any difference. I will see him back in 1 month with CBC and CMP. The patient understands the plans discussed today and are in agreement with them. They know to contact our office if he develops concerns prior to his next appointment.   I provided 15 minutes of face-to-face time during this this encounter and > 50% was spent counseling as documented under my assessment and plan.    I,Jasmine M Lassiter,acting as a scribe for Dellia Beckwith, MD.,have documented all relevant documentation on the behalf of Dellia Beckwith, MD,as directed by  Dellia Beckwith, MD while in the presence of Dellia Beckwith, MD.

## 2023-01-27 ENCOUNTER — Other Ambulatory Visit: Payer: Self-pay | Admitting: Oncology

## 2023-01-27 ENCOUNTER — Inpatient Hospital Stay: Payer: Medicaid Other

## 2023-01-27 ENCOUNTER — Telehealth: Payer: Self-pay

## 2023-01-27 ENCOUNTER — Inpatient Hospital Stay (INDEPENDENT_AMBULATORY_CARE_PROVIDER_SITE_OTHER): Payer: Medicaid Other | Admitting: Oncology

## 2023-01-27 ENCOUNTER — Encounter: Payer: Self-pay | Admitting: Oncology

## 2023-01-27 ENCOUNTER — Other Ambulatory Visit: Payer: Self-pay

## 2023-01-27 VITALS — BP 129/66 | HR 57 | Temp 98.2°F | Resp 18 | Ht 71.0 in | Wt 225.7 lb

## 2023-01-27 DIAGNOSIS — E8881 Metabolic syndrome: Secondary | ICD-10-CM

## 2023-01-27 DIAGNOSIS — D693 Immune thrombocytopenic purpura: Secondary | ICD-10-CM

## 2023-01-27 LAB — LIPID PANEL
Cholesterol: 130 (ref 0–200)
HDL: 37 (ref 35–70)
LDL Cholesterol: 72
LDl/HDL Ratio: 3.5
Triglycerides: 103 (ref 40–160)

## 2023-01-27 LAB — COMPREHENSIVE METABOLIC PANEL
Albumin: 4.7 (ref 3.5–5.0)
Calcium: 9.7 (ref 8.7–10.7)

## 2023-01-27 LAB — CBC AND DIFFERENTIAL
HCT: 43 (ref 41–53)
Hemoglobin: 15 (ref 13.5–17.5)
Neutrophils Absolute: 4.54
Platelets: 83 10*3/uL — AB (ref 150–400)
WBC: 8.1

## 2023-01-27 LAB — BASIC METABOLIC PANEL
BUN: 27 — AB (ref 4–21)
CO2: 25 — AB (ref 13–22)
Chloride: 102 (ref 99–108)
Creatinine: 1 (ref 0.6–1.3)
Glucose: 114
Potassium: 4.1 mEq/L (ref 3.5–5.1)
Sodium: 140 (ref 137–147)

## 2023-01-27 LAB — HEPATIC FUNCTION PANEL
ALT: 48 U/L — AB (ref 10–40)
AST: 37 (ref 14–40)
Alkaline Phosphatase: 65 (ref 25–125)
Bilirubin, Total: 1.2

## 2023-01-27 LAB — CBC: RBC: 5.04 (ref 3.87–5.11)

## 2023-01-27 NOTE — Telephone Encounter (Signed)
-----   Message from Dellia Beckwith, MD sent at 01/27/2023  6:24 AM EDT ----- Regarding: neuro ref Didn't we make a Neuro referral back in March or April? Whatever happened with that?

## 2023-01-27 NOTE — Telephone Encounter (Signed)
-----   Message from Christine H McCarty, MD sent at 01/27/2023  6:24 AM EDT ----- Regarding: neuro ref Didn't we make a Neuro referral back in March or April? Whatever happened with that?  

## 2023-01-27 NOTE — Telephone Encounter (Signed)
Resent referral to [facility] via fax and electronic through [employer]

## 2023-01-27 NOTE — Telephone Encounter (Signed)
-----   Message from Dellia Beckwith, MD sent at 01/27/2023  1:07 PM EDT ----- Regarding: call Tell him rest of labs look good incl K 4.1 and cholesterol 130. BUN high so he is a little dehydrated

## 2023-01-27 NOTE — Telephone Encounter (Signed)
Place another referral to [facility] in [place] via [employer]. Will pay close attention to see if it needs follow up.

## 2023-01-28 ENCOUNTER — Telehealth: Payer: Self-pay

## 2023-01-28 ENCOUNTER — Other Ambulatory Visit: Payer: 59

## 2023-01-28 ENCOUNTER — Ambulatory Visit: Payer: 59 | Admitting: Oncology

## 2023-01-28 NOTE — Telephone Encounter (Signed)
-----   Message from Christine H McCarty, MD sent at 01/27/2023  1:07 PM EDT ----- Regarding: call Tell him rest of labs look good incl K 4.1 and cholesterol 130. BUN high so he is a little dehydrated  

## 2023-01-28 NOTE — Telephone Encounter (Signed)
Patient notified of lab results

## 2023-02-04 ENCOUNTER — Inpatient Hospital Stay: Payer: 59 | Attending: Oncology | Admitting: Licensed Clinical Social Worker

## 2023-02-04 DIAGNOSIS — D693 Immune thrombocytopenic purpura: Secondary | ICD-10-CM

## 2023-02-04 NOTE — Progress Notes (Signed)
[facility] CSW Counseling Note  Patient was referred by medical provider. Treatment type: Individual  Presenting Concerns: Patient and/or family reports the following symptoms/concerns: anxiety, depression, and adjustment to diagnosis Duration of problem: 8 months; Severity of problem: moderate   Orientation:oriented to person, place, time/date, situation, day of week, month of year, and year.   Affect: Appropriate Risk of harm to self or others: No plan to harm self or others  Patient and/or Family's Strengths/Protective Factors: Social connections, Concrete supports in place (healthy food, safe environments, etc.), Physical Health (exercise, healthy diet, medication compliance, etc.), and Caregiver has knowledge of parenting & child developmentAverage or above average intelligence  Capable of independent living  Arboriculturist fund of knowledge  Motivation for treatment/growth  Physical Health  Religious Affiliation  Special hobby/interest  Supportive family/friends  Work skills      Goals Addressed: Patient will:  Reduce symptoms of: anxiety, depression, and adjustment concerns Increase knowledge and/or ability of: coping skills, healthy habits, self-management skills, and stress reduction  Increase healthy adjustment to current life circumstances, Increase adequate support systems for patient/family, Increase motivation to adhere to plan of care, and Begin healthy grieving over loss   Progress towards Goals: Progressing   Interventions: Interventions utilized:  CBT, Solution Focused, Strength-based, Supportive, and Reframing      Assessment: Patient currently experiencing anxiety, depression and stress. Patient continues to be receptive in session and continues experiencing difficulty using skills on a regular basis. Patient reports he is doing well and is finding improvement with PT.  Discussed continuing the exercises everyday. Discussed  possibly starting classes in the summer. Discussed long term coping methods including advocacy with medical providers when patient has questions or concerns, mindfulness and physical activity. Recommended patient continue patient continue with therapy and gave him information on different mental health providers in the area.  .      Plan: Follow up with CSW: termination session Behavioral recommendations: coping skills, self-management skills, and stress  Referral(s): Support group(s):  N/A         Joseph Art, LCSW   Patient is participating in a Managed Medicaid Plan:  Yes

## 2023-02-05 ENCOUNTER — Other Ambulatory Visit: Payer: Self-pay | Admitting: Oncology

## 2023-02-05 DIAGNOSIS — D693 Immune thrombocytopenic purpura: Secondary | ICD-10-CM

## 2023-03-01 IMAGING — CT CT HEART MORP W/ CTA COR W/ SCORE W/ CA W/CM &/OR W/O CM
4 of 7 series · 8 of 20 positions shown, 9 images · IV contrast (APPLIED)
Comparison: No priors.
COMPARISON: No priors.

Addendum:
EXAM:
OVER-READ INTERPRETATION  CT CHEST

The following report is an over-read performed by radiologist Dr.
Bunday Buffon [REDACTED] on 03/14/2021. This
over-read does not include interpretation of cardiac or coronary
anatomy or pathology. The coronary calcium score/coronary CTA
interpretation by the cardiologist is attached.
CLINICAL DATA: 50M with hypertension and OSA.
Cardiac/Coronary  CT
TECHNIQUE: The patient was scanned on a [employer] Force scanner.

[Series 6: best diast 75 % · axial · 0.39mm/px · z∈[+1097,+1144]mm · 2 of 351 slices shown]
[im 117/351  vessel]
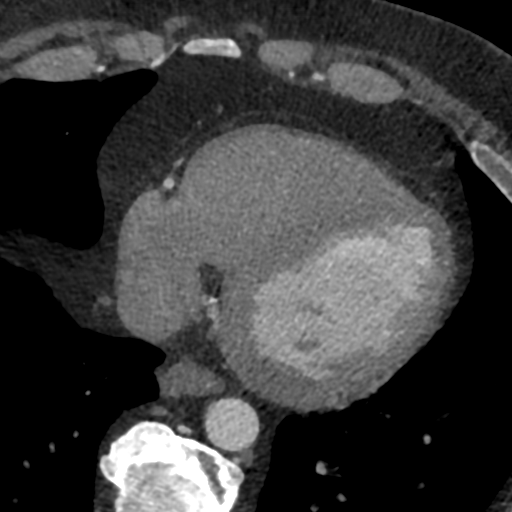
[im 234/351  vessel]
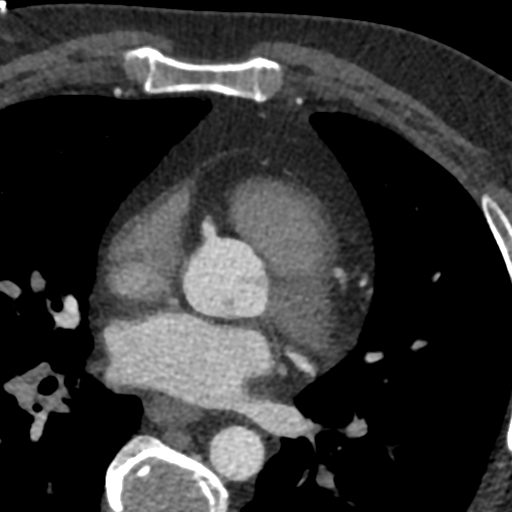

[Series 7: best syst · axial · 0.39mm/px · z∈[+1097,+1144]mm · 2 of 351 slices shown, 3 images]
[im 117/351  vessel]
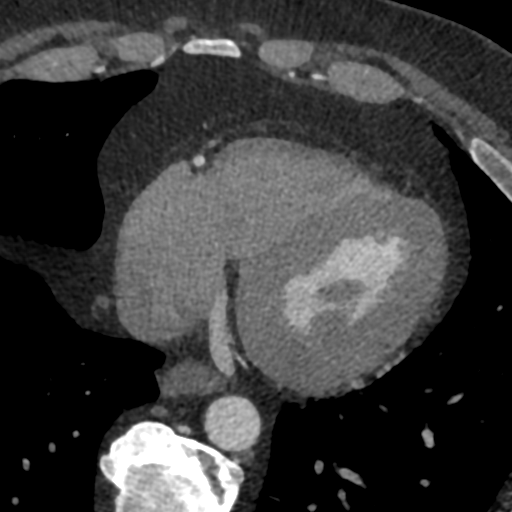
[im 117/351  lung]
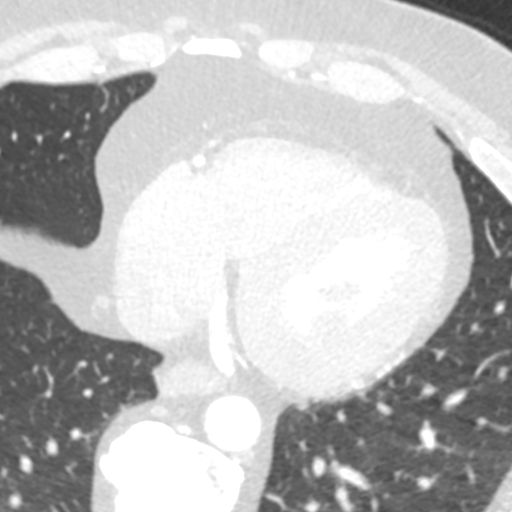
[im 234/351  vessel]
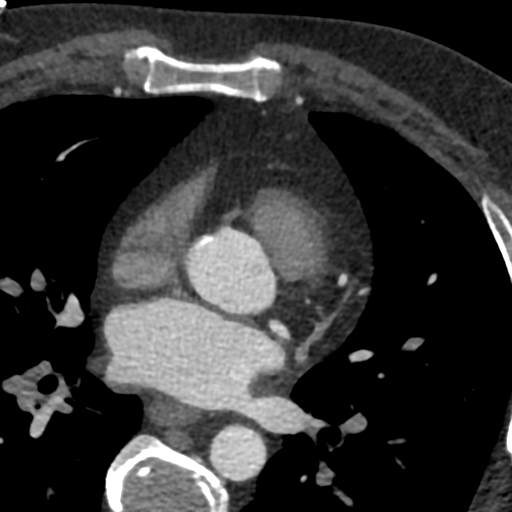

[Series 8: ts diast sharp 75 % · axial · 0.39mm/px · z∈[+1097,+1144]mm · 2 of 351 slices shown]
[im 117/351  lung]
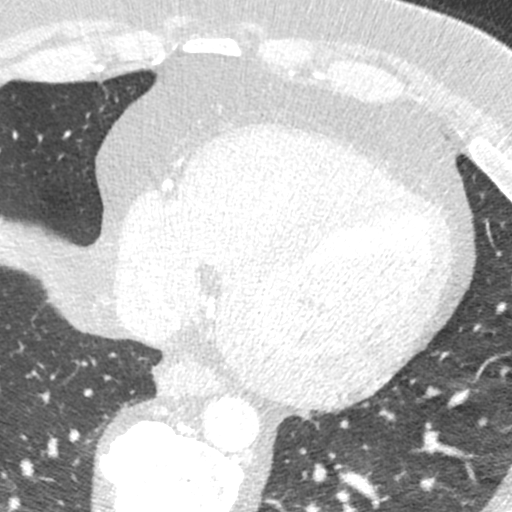
[im 234/351  lung]
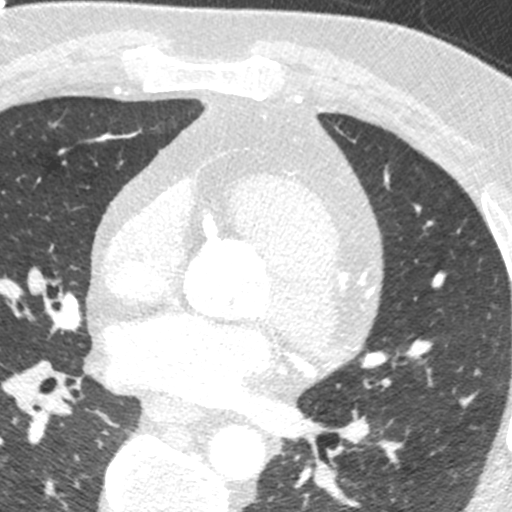

[Series 9: ts syst sharp · axial · 0.39mm/px · z∈[+1097,+1144]mm · 2 of 351 slices shown]
[im 117/351  lung]
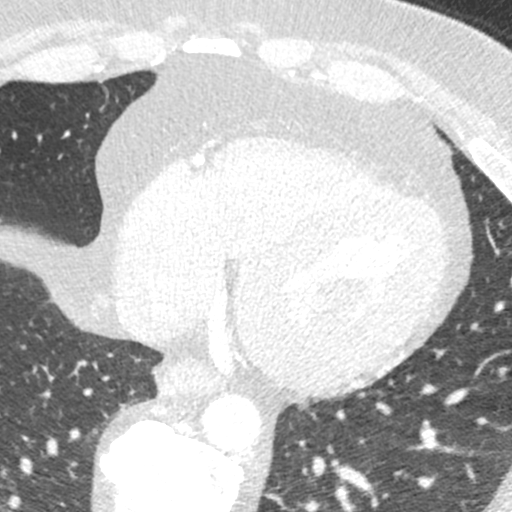
[im 234/351  lung]
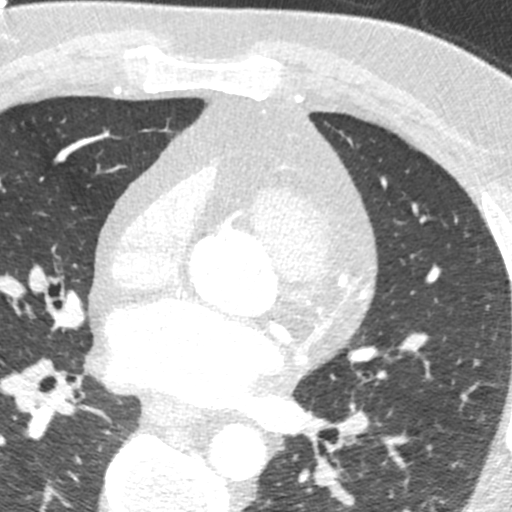

[8 of 20 positions shown; findings below may reference images not displayed]

FINDINGS: Mild linear scarring in the left lower lobe. Atherosclerotic
calcifications in the thoracic aorta. Within the visualized portions
of the thorax there are no suspicious appearing pulmonary nodules or
masses, there is no acute consolidative airspace disease, no pleural
effusions, no pneumothorax and no lymphadenopathy. Visualized
portions of the upper abdomen are unremarkable. There are no
aggressive appearing lytic or blastic lesions noted in the
visualized portions of the skeleton.
IMPRESSION: 1.  Aortic Atherosclerosis (P5CLL-G22.2).
FINDINGS: A 120 kV prospective scan was triggered in the descending thoracic
aorta at 111 HU's. Axial non-contrast 3 mm slices were carried out
through the heart. The data set was analyzed on a dedicated work
station and scored using the Agatson method. Gantry rotation speed
was 250 msecs and collimation was .6 mm. No beta blockade and 0.8 mg
of sl NTG was given. The 3D data set was reconstructed in 5%
intervals of the 67-82 % of the R-R cycle. Diastolic phases were
analyzed on a dedicated work station using MPR, MIP and VRT modes.
The patient received 80 cc of contrast.

Aorta: Normal size.  2.6 cm.  Minimal calcification.  No dissection.

Aortic Valve:  Trileaflet.  No calcifications.

Coronary Arteries:  Normal coronary origin.  Right dominance.

RCA is a large dominant artery that gives rise to PDA and PLVB.
There is minimal (<25%) calcified plaque proximally and mild
(25-49%) calcified plaque in the mid RCA. There is minimal (<25%)
calcification of the PDA.

Left main is a large artery that gives rise to LAD and LCX arteries.
There is minimal (<25%) calcified plaque.

LAD is a large vessel that has minimal (<25%) calcified plaque in
the mid LAD.

LCX is a non-dominant artery that gives rise to two OM branches.
There is no plaque.

Coronary Calcium Score:

Left main:

Left anterior descending artery:

Left circumflex artery:

Right coronary artery: 197

Total: 296

Percentile: 96th

Other findings:

Normal pulmonary vein drainage into the left atrium.

Normal let atrial appendage without a thrombus.

Normal size of the pulmonary artery.
IMPRESSION: 1. Coronary calcium score of 296. This was 96th percentile for age-,
race-, and sex-matched controls.

2. Normal coronary origin with right dominance.

3. Minimal plaque in LM and LAD. Mild (25-49%) RCA plaque. CAD-RADS
2.

4. Recommend aggressive risk factor modification, including LDL goal
<70.

*** End of Addendum ***
EXAM:
OVER-READ INTERPRETATION  CT CHEST

The following report is an over-read performed by radiologist Dr.
Bunday Buffon [REDACTED] on 03/14/2021. This
over-read does not include interpretation of cardiac or coronary
anatomy or pathology. The coronary calcium score/coronary CTA
interpretation by the cardiologist is attached.
FINDINGS: Mild linear scarring in the left lower lobe. Atherosclerotic
calcifications in the thoracic aorta. Within the visualized portions
of the thorax there are no suspicious appearing pulmonary nodules or
masses, there is no acute consolidative airspace disease, no pleural
effusions, no pneumothorax and no lymphadenopathy. Visualized
portions of the upper abdomen are unremarkable. There are no
aggressive appearing lytic or blastic lesions noted in the
visualized portions of the skeleton.
IMPRESSION: 1.  Aortic Atherosclerosis (P5CLL-G22.2).

## 2023-03-03 ENCOUNTER — Encounter: Payer: Self-pay | Admitting: Oncology

## 2023-03-03 ENCOUNTER — Telehealth: Payer: Self-pay

## 2023-03-03 ENCOUNTER — Inpatient Hospital Stay: Payer: Medicaid Other

## 2023-03-03 ENCOUNTER — Inpatient Hospital Stay: Payer: Medicaid Other | Attending: Oncology | Admitting: Oncology

## 2023-03-03 ENCOUNTER — Other Ambulatory Visit: Payer: Self-pay | Admitting: Oncology

## 2023-03-03 VITALS — BP 116/64 | HR 57 | Temp 97.4°F | Resp 16 | Ht 71.0 in | Wt 226.9 lb

## 2023-03-03 DIAGNOSIS — D693 Immune thrombocytopenic purpura: Secondary | ICD-10-CM

## 2023-03-03 LAB — CBC AND DIFFERENTIAL
HCT: 42 (ref 41–53)
Hemoglobin: 14.5 (ref 13.5–17.5)
Neutrophils Absolute: 6.32
Platelets: 87 10*3/uL — AB (ref 150–400)
WBC: 9.3

## 2023-03-03 LAB — CBC: RBC: 4.89 (ref 3.87–5.11)

## 2023-03-03 LAB — BASIC METABOLIC PANEL
BUN: 30 — AB (ref 4–21)
CO2: 26 — AB (ref 13–22)
Chloride: 99 (ref 99–108)
Creatinine: 0.9 (ref 0.6–1.3)
Glucose: 105
Potassium: 3.9 mEq/L (ref 3.5–5.1)
Sodium: 137 (ref 137–147)

## 2023-03-03 LAB — HEPATIC FUNCTION PANEL
ALT: 43 U/L — AB (ref 10–40)
AST: 40 (ref 14–40)
Alkaline Phosphatase: 67 (ref 25–125)
Bilirubin, Total: 1

## 2023-03-03 LAB — COMPREHENSIVE METABOLIC PANEL
Albumin: 4.5 (ref 3.5–5.0)
Calcium: 9.2 (ref 8.7–10.7)

## 2023-03-03 NOTE — Progress Notes (Signed)
[facility]  [address] [place],  [place]  [address] [phone]  Clinic Day:  03/03/23   Referring physician: Julianne Handler, NP  CHIEF COMPLAINT:  CC:  Immune thrombocytopenic purpura, chronic  Current Treatment:   Prednisone 5 mg daily  HISTORY OF PRESENT ILLNESS:  Mike [name] is a 52 y.o. male with a history of severe thrombocytopenia, felt to represent immune thrombocytopenic purpura.  He has had thrombocytopenia, which has fluctuated up and down since June 2020.  Further laboratory evaluation did not reveal any other specific etiology for his thrombocytopenia.  He had mildly elevated liver transaminases.  Abdominal ultrasound revealed increased echogenicity of hepatic parenchyma suggesting hepatic steatosis, with probable fatty sparing adjacent to gallbladder fossa.  No other abnormality was seen.  We recommended bone marrow examination, but the patient and his mother declined.  The platelet count continued to fluctuate up and down, but as his platelet count remained 25,000 in June 2021, he was started on prednisone 20 mg 3 times daily, with a good response.  He has had leukocytosis secondary to the high-dose prednisone.  He also has had oral candidiasis on occasion. Mike [name] titers were elevated, so this may be representative of chronic fatigue syndrome. We decreased his prednisone to 45 mg in late September and platelets remained stable at 97,000.  Since we were unable to taper him off the prednisone he was treated with rituximab weekly for 4 weeks in October and November 2021.  We had been slowly tapering the prednisone.  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  He has also had hypokalemia resolved with oral supplementation.    On May 18th, his platelet count was 151,000, so I decreased his prednisone to 5 mg daily. He was admitted with COVID-19 on May 24th after leaving work earlier  due to altered mental status and generalized weakness. He had acute kidney injury with a creatinine of 2.2, which normalized with IV hydration. He had fever, but no respiratory symptoms. He was discharged on a prolonged prednisone taper over 15 weeks.  He had been having some chest pain and dyspnea since discontinuing indapamide, so was referred to Dr. Tomie China in Cardiology. He placed him on furosemide 40 mg daily and metoprolol 50 mg daily.  He had had problems with hypokalemia for a long time but this worsened with the diuretic.  He also had him increase his potassium to 40 mg twice daily.  He states his lower extremity edema has resolved with the furosemide.  The ECHO shows left ventricular hypertrophy with an EF between 55-60%, and the CT of the coronary arteries looks good.  He is currently on gabapentin 300 mg BID.  He was on a extremely slow taper of the prednisone, but his platelets dropped to 73,000 when the dose was dropped to 5 mg three times per week.  I increased it back to 5 mg daily and stopped the gabapentin as it wasn't helping.  MRI of the lumbar spine from October 27th revealed degenerative changes of the lumbar spine most prominent L4-5.  INTERVAL HISTORY:  Mike [name] is here today for routine follow up for chronic immune thrombocytopenic purpura. Patient states that he feels well and has chronic lower back pain. He has pain and numbness with prolonged standing and he has been going for physical therapy. He complains of funny taste and dryness in his mouth and I said it could be due to thrush from his  prolonged use of steroids. He is on 5 mg of prednisone. He was referred to the neurosurgeon for opinion of an abnormal MRI finding which showed enlargement of one lateral ventricle, and we are thinking it was possibly hydrocephalus or a congenital abnormality. Dr Conchita Paris was not concerned about the MRI but has scheduled a repeat in 3 months. He was referred to a  neurologist, Dr Teresa Coombs to have an  EEG done. He has been prescribed keppra for possible seizures and we prefer to wait until we have a definitive diagnosis from the neurologist before starting that. I agree it is not likely to affect his platelet count, but if he starts having more episodes he can commence the medication. His hemoglobin is 14.5, WBC is 9.3 and  platelet count is 87,000 today. His CMP is normal except for some signs of dehydration with a BUN of 30 and blood sugar of 105. I will see him back in 2 months with CBC and CMP. He  denies signs of infections such as sore throat, sinus drainage, cough or urinary symptoms. He  denies fever or recurrent chills. He  also deny nausea, vomiting, chest pain dyspnea or cough. His  appetite is good and His  weight has decreased 1 pounds over last 5 weeks    REVIEW OF SYSTEMS:  Review of Systems  Constitutional: Negative.  Negative for appetite change, chills, diaphoresis, fatigue, fever and unexpected weight change.  HENT:  Negative.  Negative for hearing loss, lump/mass, mouth sores, nosebleeds, sore throat, tinnitus, trouble swallowing and voice change.   Eyes: Negative.  Negative for eye problems and icterus.  Respiratory: Negative.  Negative for chest tightness, cough, hemoptysis, shortness of breath and wheezing.   Cardiovascular: Negative.  Negative for chest pain, leg swelling and palpitations.  Gastrointestinal: Negative.  Negative for abdominal distention, abdominal pain, blood in stool, constipation, diarrhea, nausea, rectal pain and vomiting.  Endocrine: Negative.   Genitourinary: Negative.  Negative for bladder incontinence, difficulty urinating, dyspareunia, dysuria, frequency, hematuria, nocturia, pelvic pain and penile discharge.   Musculoskeletal:  Positive for arthralgias (of the left hip, right shoulder) and back pain (chronic). Negative for flank pain, gait problem, myalgias, neck pain and neck stiffness.  Skin: Negative.  Negative for itching, rash and wound.   Neurological:  Negative for dizziness, extremity weakness, gait problem, headaches, light-headedness, numbness, seizures and speech difficulty.       He describes episodes of confusion which lasts a short time. He has had none recently. He was seen by the neurosurgeon who feels this may possibly be seizures and prescribed Keppra. We will wait until he is evaluated by the neurologist.  Hematological: Negative.  Negative for adenopathy. Does not bruise/bleed easily.  Psychiatric/Behavioral:  Negative for confusion, decreased concentration, depression, sleep disturbance and suicidal ideas. The patient is nervous/anxious.   All other systems reviewed and are negative.   VITALS:  Blood pressure 116/64, pulse (!) 57, temperature (!) 97.4 F (36.3 C), temperature source Oral, resp. rate 16, height 5\' 11"  (1.803 m), weight 226 lb 14.4 oz (102.9 kg), SpO2 100%.  Wt Readings from Last 3 Encounters:  03/03/23 226 lb 14.4 oz (102.9 kg)  01/27/23 225 lb 11.2 oz (102.4 kg)  12/10/22 223 lb (101.2 kg)    Body mass index is 31.65 kg/m.  Performance status (ECOG): 1 - Symptomatic but completely ambulatory  PHYSICAL EXAM:  Physical Exam Vitals and nursing note reviewed.  Constitutional:      General: He is not in acute distress.  Appearance: Normal appearance. He is normal weight. He is not ill-appearing, toxic-appearing or diaphoretic.  HENT:     Head: Normocephalic and atraumatic.     Right Ear: Tympanic membrane, ear canal and external ear normal. There is no impacted cerumen.     Left Ear: Tympanic membrane, ear canal and external ear normal. There is no impacted cerumen.     Nose: Nose normal. No congestion or rhinorrhea.     Mouth/Throat:     Mouth: Mucous membranes are dry.     Pharynx: Oropharynx is clear. No oropharyngeal exudate or posterior oropharyngeal erythema.  Eyes:     General: No scleral icterus.       Right eye: No discharge.        Left eye: No discharge.     Extraocular  Movements: Extraocular movements intact.     Conjunctiva/sclera: Conjunctivae normal.     Pupils: Pupils are equal, round, and reactive to light.  Neck:     Vascular: No carotid bruit.  Cardiovascular:     Rate and Rhythm: Normal rate and regular rhythm.     Pulses: Normal pulses.     Heart sounds: Normal heart sounds. No murmur heard.    No friction rub. No gallop.  Pulmonary:     Effort: Pulmonary effort is normal. No respiratory distress.     Breath sounds: Normal breath sounds. No stridor. No wheezing, rhonchi or rales.  Chest:     Chest wall: No tenderness.  Abdominal:     General: Bowel sounds are normal. There is no distension.     Palpations: Abdomen is soft. There is no hepatomegaly, splenomegaly or mass.     Tenderness: There is no abdominal tenderness. There is no right CVA tenderness, left CVA tenderness, guarding or rebound.     Hernia: No hernia is present.  Musculoskeletal:        General: Normal range of motion.     Cervical back: Normal range of motion and neck supple. No rigidity or tenderness.     Right lower leg: No edema.     Left lower leg: No edema.  Lymphadenopathy:     Cervical: No cervical adenopathy.     Upper Body:     Right upper body: No supraclavicular or axillary adenopathy.     Left upper body: No supraclavicular or axillary adenopathy.     Lower Body: No right inguinal adenopathy. No left inguinal adenopathy.  Skin:    General: Skin is warm and dry.     Coloration: Skin is not jaundiced.     Findings: No rash.  Neurological:     General: No focal deficit present.     Mental Status: He is alert and oriented to person, place, and time. Mental status is at baseline.     Cranial Nerves: No cranial nerve deficit.  Psychiatric:        Mood and Affect: Mood normal.        Behavior: Behavior normal.        Thought Content: Thought content normal.        Judgment: Judgment normal.    LABS:      Latest Ref Rng & Units 03/03/2023   12:00 AM  01/27/2023   12:00 AM 12/17/2022    8:57 AM  CBC  WBC  9.3     8.1     8.4   Hemoglobin 13.5 - 17.5 14.5     15.0     15.1   Hematocrit  41 - 53 42     43     44.6   Platelets 150 - 400 K/uL 87     83     97      This result is from an external source.      Latest Ref Rng & Units 03/03/2023   12:00 AM 01/27/2023   12:00 AM 12/17/2022    8:57 AM  CMP  Glucose 70 - 99 mg/dL   161   BUN 4 - 21 30     27     27    Creatinine 0.6 - 1.3 0.9     1.0     0.89   Sodium 137 - 147 137     140     136   Potassium 3.5 - 5.1 mEq/L 3.9     4.1     3.4   Chloride 99 - 108 99     102     98   CO2 13 - 22 26     25     27    Calcium 8.7 - 10.7 9.2     9.7     9.0   Total Protein 6.5 - 8.1 g/dL   7.6   Total Bilirubin 0.3 - 1.2 mg/dL   1.8   Alkaline Phos 25 - 125 67     65     66   AST 14 - 40 40     37     33   ALT 10 - 40 U/L 43     48     35      This result is from an external source.    STUDIES:       HISTORY:   Allergies: No Known Allergies  Current Medications: Current Outpatient Medications  Medication Sig Dispense Refill   levETIRAcetam (KEPPRA) 500 MG tablet Take 500 mg by mouth 2 (two) times daily. 03/03/23 has not started taking this medication yet     acetaminophen (TYLENOL) 500 MG tablet Take 500 mg by mouth every 4 (four) hours as needed for moderate pain.     albuterol (VENTOLIN HFA) 108 (90 Base) MCG/ACT inhaler Inhale 2 puffs into the lungs every 4 (four) hours as needed for wheezing or shortness of breath.     amLODipine (NORVASC) 10 MG tablet Take 10 mg by mouth daily.     Ascorbic Acid (VITAMIN C) 1000 MG tablet Take 1,000 mg by mouth daily.     azelastine (ASTELIN) 0.1 % nasal spray Place 1 spray into both nostrils every 12 (twelve) hours.     Bempedoic Acid-Ezetimibe (NEXLIZET) 180-10 MG TABS Take 1 tablet by mouth daily in the afternoon. 90 tablet 3   Bempedoic Acid-Ezetimibe (NEXLIZET) 180-10 MG TABS Take 1 tablet by mouth daily in the afternoon. 28 tablet 0    benazepril (LOTENSIN) 20 MG tablet Take 1 tablet (20 mg total) by mouth daily. 90 tablet 3   busPIRone (BUSPAR) 5 MG tablet Take 5 mg by mouth 2 (two) times daily.     CALCIUM PO Take 1 tablet by mouth daily.     escitalopram (LEXAPRO) 20 MG tablet Take 20 mg by mouth daily.     folic acid (FOLVITE) 1 MG tablet TAKE ONE TABLET BY MOUTH ONCE DAILY 90 tablet 3   furosemide (LASIX) 40 MG tablet Take 1 tablet (40 mg total) by mouth daily. 30 tablet 3   glipiZIDE (GLUCOTROL XL) 10 MG 24 hr tablet Take  10 mg by mouth daily.     metFORMIN (GLUCOPHAGE-XR) 500 MG 24 hr tablet Take 1,000 mg by mouth 2 (two) times daily.     metoprolol succinate (TOPROL-XL) 100 MG 24 hr tablet Take 1 tablet (100 mg total) by mouth daily. Take with or immediately following a meal. 90 tablet 3   NON FORMULARY Take 700 mg by mouth daily. Apple pectin      omeprazole (PRILOSEC) 20 MG capsule Take 20 mg by mouth daily.     oxyCODONE (OXY IR/ROXICODONE) 5 MG immediate release tablet Take 5 mg by mouth every 6 (six) hours as needed for pain.     potassium chloride SA (KLOR-CON M) 20 MEQ tablet Take 2 tablets (40 mEq total) by mouth 3 (three) times daily. 180 tablet 5   predniSONE (DELTASONE) 5 MG tablet Take 1 tablet (5 mg total) by mouth daily with breakfast. 90 tablet 1   pregabalin (LYRICA) 25 MG capsule Take 25-50 mg by mouth at bedtime. Have not started yet     vitamin B-12 (CYANOCOBALAMIN) 1000 MCG tablet Take 1,000 mcg by mouth daily.     vitamin k 100 MCG tablet Take 100 mcg by mouth daily.     No current facility-administered medications for this visit.    ASSESSMENT & PLAN:  Assessment:  1. ITP: Significant thrombocytopenia, which has been fluctuating up and down since June 2020.  This is chronic immune thrombocytopenic purpura.  We placed him on prednisone 20 mg three times daily in early June 2020.  We were unable to taper his steroids, so he underwent rituximab therapy weekly for 4 weeks in October 2021 with a  partial response.  He is not a good candidate for splenectomy unless absolutely necessary.  His platelet count had remained above 75,000, we have steadily tapered his prednisone and prior to his hospital admission in May 2022, he was on prednisone 5mg  daily. He was admitted for COVID and placed back on high dose prednisone, with an extremely slow taper, down to 2.5 mg daily.  His platelet count worsened in August 2022, so we increased the dose back to 5 mg daily, but he has not normalized, so remains on this dose.  His platelet count continues to fluctuate up and down for the last two years and today dropped to 83,000. I cannot explain this but it has been going up and down like this for years.    2. Steroid induced diabetes, for which he continues metformin 500 mg twice daily and glipizide 5 mg daily.     3.  Elevated Epstein Barr titers.  This may be indicative of chronic fatigue syndrome, and I advised that he rest as needed.  4.  Sleep apnea. I have encouraged him to use his CPAP mask for better quality of life and to reduce his daytime sleepiness.  He has been referred to  Pulmonary.   5.  Chronic anxiety, mainly related to his health, and also to his job and insurance issues. I encouraged him to start the Buspar 5 mg. We did have him seeing our LCSW for counseling.   6.  Hospital admission with acute kidney injury due to dehydration associated with COVID-19, May 2022.   He was placed on an extremely slow prednisone taper.    7.  Significant left hip and lower back pain due to osteoarthritis and degenerative disc disease.  He is following with a spine specialist.  He is undergoing physical therapy. MRI of the lumbar spine revealed  degenerative changes of L4-5. His surgeon does not feel comfortable doing any invasive procedure or surgery with his persistent thrombocytopenia.  8.  Elevated bilirubin which has persisted but remains stable. I suspect possible Gilbert's syndrome, but we will hold off  on ordering [id number] as it would not change our plan of care.  9. Episodes of pre-syncope which are unexplained and could be cardiac or medication or hypoglycemia. In view of the possibility of a petit mal seizure or CNS hemorrhage, a brain MRI was done. It was then repeated with contrast. His MRI of the head on 12/08/2022 shows abnormal size and shape of the ventricles with asymmetric enlargement of the left frontal horn. We don't know if this is congenital or whether it represents hydrocephalus.  A repeat scan was suggested in 6 months, in October to see if there is any difference. I have referred him to a neurosurgeon who does not seem very concerned about this but will repeat in 3 months, which will be 6 months from the April scan. He was referred to a  neurologist, Dr Teresa Coombs to have an EEG done. He has been prescribed keppra for possible seizures but we prefer to wait until we have a definitive diagnosis from the neurologist before starting that. I agree it is not likely to affect his platelet count.  If he starts having more episodes he can commence the medication.    Plan:      He has pain and numbness with prolonged standing and he has been going for physical therapy. He complains of funny taste and dryness in his mouth and I said it could be due to thrush from his prolonged use of steroids. He is on 5 mg of prednisone. He was referred to the neurosurgeon for opinion of an abnormal MRI finding which showed enlargement of one lateral ventricle, and we are thinking it was possibly hydrocephalus or congenital abnormality. Dr Conchita Paris was not concerned about the MRI, but has scheduled a repeat in 3 months. He was referred to a  neurologist, Dr Teresa Coombs to have an EEG done. He has been prescribed keppra for possible seizures and we prefer to wait until we have a definitive diagnosis from the neurologist before starting that. I agree it is not likely to affect his platelet count, but if he starts having more  episodes he can commence the medication. His hemoglobin is 14.5, WBC is 9.3 and  platelet count is 87,000 today. His CMP is normal except for some signs of dehydration with a BUN of 30 and blood sugar of 105. I will see him back in 2 months with CBC and CMP. The patient understands the plans discussed today and are in agreement with them. They know to contact our office if he develops concerns prior to his next appointment.    I provided 25 minutes of face-to-face time during this this encounter and > 50% was spent counseling as documented under my assessment and plan.    I,Oluwatobi Asade,acting as a scribe for Dellia Beckwith, MD.,have documented all relevant documentation on the behalf of Dellia Beckwith, MD,as directed by  Dellia Beckwith, MD while in the presence of Dellia Beckwith, MD.

## 2023-03-03 NOTE — Telephone Encounter (Signed)
Needs PA for Nexlizet.  Key [id number] Last name: Mike [name] DOB: 04/12/1971

## 2023-03-04 ENCOUNTER — Other Ambulatory Visit: Payer: Self-pay | Admitting: Cardiology

## 2023-03-04 ENCOUNTER — Telehealth: Payer: Self-pay | Admitting: Cardiology

## 2023-03-04 ENCOUNTER — Other Ambulatory Visit (HOSPITAL_COMMUNITY): Payer: Self-pay

## 2023-03-04 ENCOUNTER — Telehealth: Payer: Self-pay

## 2023-03-04 ENCOUNTER — Telehealth: Payer: Self-pay | Admitting: Oncology

## 2023-03-04 DIAGNOSIS — I10 Essential (primary) hypertension: Secondary | ICD-10-CM

## 2023-03-04 MED ORDER — NEXLIZET 180-10 MG PO TABS: 1.0000 | ORAL_TABLET | Freq: Every day | ORAL | 0 refills | Status: DC

## 2023-03-04 NOTE — Telephone Encounter (Signed)
03/04/23 Next appt scheduled and confirmed with patient 

## 2023-03-04 NOTE — Telephone Encounter (Signed)
Mike [name] aware that PA has been submitted and samples are ready to pickup.

## 2023-03-04 NOTE — Telephone Encounter (Signed)
Pt c/o medication issue:  1. Name of Medication:   Bempedoic Acid-Ezetimibe (NEXLIZET) 180-10 MG TABS    2. How are you currently taking this medication (dosage and times per day)?   3. Are you having a reaction (difficulty breathing--STAT)?   4. What is your medication issue? Patient's sister is requesting a return call to discuss getting samples of this medication until the PA is sent in and they receive the medication. She states that he only has 2 days left of this and it will take a few days for pharmacy to send through mail.

## 2023-03-04 NOTE — Telephone Encounter (Signed)
PA initiated, please see separate encounter for updates on determination. (I will route you back in once a decision has been made)  Chloe Miyoshi, CPhT Pharmacy Patient Advocate Specialist Direct Number: [phone] Fax: [phone]  

## 2023-03-04 NOTE — Telephone Encounter (Signed)
Pharmacy Patient Advocate Encounter   Received notification from [employer] that prior authorization for NEXLIZET is needed.    PA submitted on 03/04/23 Key [id number] Status is pending  Haze Rushing, CPhT Pharmacy Patient Advocate Specialist Direct Number: [phone] Fax: [phone]

## 2023-03-04 NOTE — Telephone Encounter (Signed)
Left vm for [name] for approval.

## 2023-03-04 NOTE — Telephone Encounter (Signed)
Pharmacy Patient Advocate Encounter  Prior Authorization for NEXLIZET has been approved.    Effective dates: 03/04/23 through 09/01/23  Haze Rushing, CPhT Pharmacy Patient Advocate Specialist Direct Number: [phone] Fax: [phone]

## 2023-04-13 ENCOUNTER — Telehealth: Payer: Self-pay | Admitting: Cardiology

## 2023-04-13 MED ORDER — NEXLIZET 180-10 MG PO TABS: 1.0000 | ORAL_TABLET | Freq: Every day | ORAL | Status: DC

## 2023-04-13 NOTE — Telephone Encounter (Signed)
Pt needs samples as he has lost his Medicaid but will have disability starting 07/03/23. Samples placed for pt.

## 2023-04-13 NOTE — Telephone Encounter (Signed)
New Message:       Please have Ladonna Snide to call patient's sister.She says she need to discuss his medicine with her please.

## 2023-04-16 ENCOUNTER — Other Ambulatory Visit: Payer: Self-pay | Admitting: Oncology

## 2023-04-16 DIAGNOSIS — E782 Mixed hyperlipidemia: Secondary | ICD-10-CM

## 2023-04-16 DIAGNOSIS — E099 Drug or chemical induced diabetes mellitus without complications: Secondary | ICD-10-CM

## 2023-04-16 DIAGNOSIS — E8881 Metabolic syndrome: Secondary | ICD-10-CM

## 2023-04-17 ENCOUNTER — Inpatient Hospital Stay: Payer: Medicaid Other | Attending: Oncology

## 2023-04-17 ENCOUNTER — Inpatient Hospital Stay: Payer: Medicaid Other | Admitting: Oncology

## 2023-04-17 ENCOUNTER — Other Ambulatory Visit: Payer: Self-pay | Admitting: Oncology

## 2023-04-17 VITALS — BP 108/68 | HR 56 | Temp 98.5°F | Resp 16 | Ht 71.0 in | Wt 211.2 lb

## 2023-04-17 DIAGNOSIS — E099 Drug or chemical induced diabetes mellitus without complications: Secondary | ICD-10-CM

## 2023-04-17 DIAGNOSIS — R7401 Elevation of levels of liver transaminase levels: Secondary | ICD-10-CM | POA: Diagnosis not present

## 2023-04-17 DIAGNOSIS — D693 Immune thrombocytopenic purpura: Secondary | ICD-10-CM | POA: Diagnosis present

## 2023-04-17 DIAGNOSIS — R55 Syncope and collapse: Secondary | ICD-10-CM | POA: Insufficient documentation

## 2023-04-17 DIAGNOSIS — Z7952 Long term (current) use of systemic steroids: Secondary | ICD-10-CM | POA: Diagnosis not present

## 2023-04-17 DIAGNOSIS — E8881 Metabolic syndrome: Secondary | ICD-10-CM

## 2023-04-17 DIAGNOSIS — G4730 Sleep apnea, unspecified: Secondary | ICD-10-CM | POA: Insufficient documentation

## 2023-04-17 DIAGNOSIS — F419 Anxiety disorder, unspecified: Secondary | ICD-10-CM | POA: Insufficient documentation

## 2023-04-17 DIAGNOSIS — E782 Mixed hyperlipidemia: Secondary | ICD-10-CM

## 2023-04-17 LAB — CBC WITH DIFFERENTIAL (CANCER CENTER ONLY)
Abs Immature Granulocytes: 0.04 10*3/uL (ref 0.00–0.07)
Basophils Absolute: 0.1 10*3/uL (ref 0.0–0.1)
Basophils Relative: 1 %
Eosinophils Absolute: 0.2 10*3/uL (ref 0.0–0.5)
Eosinophils Relative: 2 %
HCT: 41.5 % (ref 39.0–52.0)
Hemoglobin: 13.8 g/dL (ref 13.0–17.0)
Immature Granulocytes: 1 %
Lymphocytes Relative: 25 %
Lymphs Abs: 1.8 10*3/uL (ref 0.7–4.0)
MCH: 29.2 pg (ref 26.0–34.0)
MCHC: 33.3 g/dL (ref 30.0–36.0)
MCV: 87.9 fL (ref 80.0–100.0)
Monocytes Absolute: 0.5 10*3/uL (ref 0.1–1.0)
Monocytes Relative: 6 %
Neutro Abs: 4.9 10*3/uL (ref 1.7–7.7)
Neutrophils Relative %: 65 %
Platelet Count: 98 10*3/uL — ABNORMAL LOW (ref 150–400)
RBC: 4.72 MIL/uL (ref 4.22–5.81)
RDW: 12.3 % (ref 11.5–15.5)
WBC Count: 7.4 10*3/uL (ref 4.0–10.5)
nRBC: 0 % (ref 0.0–0.2)

## 2023-04-17 LAB — CMP (CANCER CENTER ONLY)
ALT: 24 U/L (ref 0–44)
AST: 19 U/L (ref 15–41)
Albumin: 4.9 g/dL (ref 3.5–5.0)
Alkaline Phosphatase: 56 U/L (ref 38–126)
Anion gap: 8 (ref 5–15)
BUN: 24 mg/dL — ABNORMAL HIGH (ref 6–20)
CO2: 27 mmol/L (ref 22–32)
Calcium: 9 mg/dL (ref 8.9–10.3)
Chloride: 100 mmol/L (ref 98–111)
Creatinine: 1.05 mg/dL (ref 0.61–1.24)
GFR, Estimated: >60 mL/min (ref >60)
Glucose, Bld: 104 mg/dL — ABNORMAL HIGH (ref 70–99)
Potassium: 4 mmol/L (ref 3.5–5.1)
Sodium: 135 mmol/L (ref 135–145)
Total Bilirubin: 0.9 mg/dL (ref 0.3–1.2)
Total Protein: 7.5 g/dL (ref 6.5–8.1)

## 2023-04-17 LAB — HEMOGLOBIN A1C
Hgb A1c MFr Bld: 4.8 % (ref 4.8–5.6)
Mean Plasma Glucose: 91.06 mg/dL

## 2023-04-17 LAB — LIPID PANEL
Cholesterol: 92 mg/dL (ref 0–200)
HDL: 36 mg/dL — ABNORMAL LOW (ref >40)
LDL Cholesterol: 46 mg/dL (ref 0–99)
Total CHOL/HDL Ratio: 2.6 RATIO
Triglycerides: 50 mg/dL (ref <150)
VLDL: 10 mg/dL (ref 0–40)

## 2023-04-17 NOTE — Progress Notes (Signed)
[facility]  [address] [place],  [place]  [address] [phone]  Clinic Day: 04/17/2023  Referring physician: Julianne Handler, NP  CHIEF COMPLAINT:  CC:  Immune thrombocytopenic purpura, chronic  Current Treatment:   Prednisone 5 mg daily  HISTORY OF PRESENT ILLNESS:  Mike [name] is a 52 y.o. male with a history of severe thrombocytopenia, felt to represent immune thrombocytopenic purpura.  He has had thrombocytopenia, which has fluctuated up and down since June 2020.  Further laboratory evaluation did not reveal any other specific etiology for his thrombocytopenia.  He had mildly elevated liver transaminases.  Abdominal ultrasound revealed increased echogenicity of hepatic parenchyma suggesting hepatic steatosis, with probable fatty sparing adjacent to gallbladder fossa.  No other abnormality was seen.  We recommended bone marrow examination, but the patient and his mother declined.  The platelet count continued to fluctuate up and down, but as his platelet count remained 25,000 in June 2021, he was started on prednisone 20 mg 3 times daily, with a good response.  He has had leukocytosis secondary to the high-dose prednisone.  He also has had oral candidiasis on occasion. Malachi Carl titers were elevated, so this may be representative of chronic fatigue syndrome. We decreased his prednisone to 45 mg in late September and platelets remained stable at 97,000.  Since we were unable to taper him off the prednisone he was treated with rituximab weekly for 4 weeks in October and November 2021.  We had been slowly tapering the prednisone.  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  He has also had hypokalemia resolved with oral supplementation.    On May 18th, his platelet count was 151,000, so I decreased his prednisone to 5 mg daily. He was admitted with COVID-19 on May 24th after leaving work earlier due  to altered mental status and generalized weakness. He had acute kidney injury with a creatinine of 2.2, which normalized with IV hydration. He had fever, but no respiratory symptoms. He was discharged on a prolonged prednisone taper over 15 weeks.  He had been having some chest pain and dyspnea since discontinuing indapamide, so was referred to Dr. Tomie China in Cardiology. He placed him on furosemide 40 mg daily and metoprolol 50 mg daily.  He had had problems with hypokalemia for a long time but this worsened with the diuretic.  He also had him increase his potassium to 40 mg twice daily.  He states his lower extremity edema has resolved with the furosemide.  The ECHO shows left ventricular hypertrophy with an EF between 55-60%, and the CT of the coronary arteries looks good.  He is currently on gabapentin 300 mg BID.  He was on a extremely slow taper of the prednisone, but his platelets dropped to 73,000 when the dose was dropped to 5 mg three times per week.  I increased it back to 5 mg daily and stopped the gabapentin as it wasn't helping.  MRI of the lumbar spine from October 27th revealed degenerative changes of the lumbar spine most prominent L4-5.  INTERVAL HISTORY:  Mike [name] is here today for routine follow up for chronic immune thrombocytopenic purpura. Patient states that he feels well but complains of lower back and right shoulder pain. He informed me that he will lose his insurance on August 31st and won't get another one reinstated until mid November. They inquired about getting a sample of potassium or ways to decrease medication costs. I  will discuss this with Mike [name] our Child psychotherapist. We will get him a referral again to the LCSW for counseling. He continues Prednisone 5mg . He will see a neurologist on 04/24/2023 for follow-up. His labs are pending. I will see him back in 3 months with CBC and CMP. He denies signs of infection such as sore throat, sinus drainage, cough, or urinary symptoms.  He  denies fevers or recurrent chills. He denies nausea, vomiting, chest pain, dyspnea or cough. His appetite is good and his weight has decreased 15 pounds over last 1.5 month . He is accompanied at today's visit by his sister.   REVIEW OF SYSTEMS:  Review of Systems  Constitutional: Negative.  Negative for appetite change, chills, diaphoresis, fatigue, fever and unexpected weight change.  HENT:  Negative.  Negative for hearing loss, lump/mass, mouth sores, nosebleeds, sore throat, tinnitus, trouble swallowing and voice change.   Eyes: Negative.  Negative for eye problems and icterus.  Respiratory: Negative.  Negative for chest tightness, cough, hemoptysis, shortness of breath and wheezing.   Cardiovascular: Negative.  Negative for chest pain, leg swelling and palpitations.  Gastrointestinal: Negative.  Negative for abdominal distention, abdominal pain, blood in stool, constipation, diarrhea, nausea, rectal pain and vomiting.  Endocrine: Negative.   Genitourinary: Negative.  Negative for bladder incontinence, difficulty urinating, dyspareunia, dysuria, frequency, hematuria, nocturia, pelvic pain and penile discharge.   Musculoskeletal:  Positive for arthralgias (of the left hip, right shoulder) and back pain (chronic). Negative for flank pain, gait problem, myalgias, neck pain and neck stiffness.  Skin: Negative.  Negative for itching, rash and wound.  Neurological:  Negative for dizziness, extremity weakness, gait problem, headaches, light-headedness, numbness, seizures and speech difficulty.  Hematological: Negative.  Negative for adenopathy. Does not bruise/bleed easily.  Psychiatric/Behavioral:  Positive for depression. Negative for confusion, decreased concentration, sleep disturbance and suicidal ideas. The patient is nervous/anxious.        Stress  All other systems reviewed and are negative.   VITALS:  Blood pressure 108/68, pulse (!) 56, temperature 98.5 F (36.9 C), temperature source  Oral, resp. rate 16, height 5\' 11"  (1.803 m), weight 211 lb 3.2 oz (95.8 kg), SpO2 99%.  Wt Readings from Last 3 Encounters:  05/01/23 208 lb (94.3 kg)  04/17/23 211 lb 3.2 oz (95.8 kg)  03/03/23 226 lb 14.4 oz (102.9 kg)    Body mass index is 29.46 kg/m.  Performance status (ECOG): 1 - Symptomatic but completely ambulatory  PHYSICAL EXAM:  Physical Exam Vitals and nursing note reviewed.  Constitutional:      General: He is not in acute distress.    Appearance: Normal appearance. He is normal weight. He is not ill-appearing, toxic-appearing or diaphoretic.  HENT:     Head: Normocephalic and atraumatic.     Right Ear: Tympanic membrane, ear canal and external ear normal. There is no impacted cerumen.     Left Ear: Tympanic membrane, ear canal and external ear normal. There is no impacted cerumen.     Nose: Nose normal. No congestion or rhinorrhea.     Mouth/Throat:     Mouth: Mucous membranes are moist.     Pharynx: Oropharynx is clear. No oropharyngeal exudate or posterior oropharyngeal erythema.  Eyes:     General: No scleral icterus.       Right eye: No discharge.        Left eye: No discharge.     Extraocular Movements: Extraocular movements intact.     Conjunctiva/sclera: Conjunctivae normal.  Pupils: Pupils are equal, round, and reactive to light.  Neck:     Vascular: No carotid bruit.  Cardiovascular:     Rate and Rhythm: Normal rate and regular rhythm.     Pulses: Normal pulses.     Heart sounds: Normal heart sounds. No murmur heard.    No friction rub. No gallop.  Pulmonary:     Effort: Pulmonary effort is normal. No respiratory distress.     Breath sounds: Normal breath sounds. No stridor. No wheezing, rhonchi or rales.  Chest:     Chest wall: No tenderness.  Abdominal:     General: Bowel sounds are normal. There is no distension.     Palpations: Abdomen is soft. There is no hepatomegaly, splenomegaly or mass.     Tenderness: There is no abdominal  tenderness. There is no right CVA tenderness, left CVA tenderness, guarding or rebound.     Hernia: No hernia is present.  Musculoskeletal:        General: Normal range of motion.     Cervical back: Normal range of motion and neck supple. No rigidity or tenderness.     Right lower leg: No edema.     Left lower leg: No edema.  Lymphadenopathy:     Cervical: No cervical adenopathy.     Upper Body:     Right upper body: No supraclavicular or axillary adenopathy.     Left upper body: No supraclavicular or axillary adenopathy.     Lower Body: No right inguinal adenopathy. No left inguinal adenopathy.  Skin:    General: Skin is warm and dry.     Coloration: Skin is not jaundiced.     Findings: No rash.  Neurological:     General: No focal deficit present.     Mental Status: He is alert and oriented to person, place, and time. Mental status is at baseline.     Cranial Nerves: No cranial nerve deficit.  Psychiatric:        Mood and Affect: Mood normal.        Behavior: Behavior normal.        Thought Content: Thought content normal.        Judgment: Judgment normal.    LABS:      Latest Ref Rng & Units 04/17/2023   11:04 AM 03/03/2023   12:00 AM 01/27/2023   12:00 AM  CBC  WBC 4.0 - 10.5 K/uL 7.4  9.3     8.1      Hemoglobin 13.0 - 17.0 g/dL 29.5  28.4     13.2      Hematocrit 39.0 - 52.0 % 41.5  42     43      Platelets 150 - 400 K/uL 98  87     83         This result is from an external source.      Latest Ref Rng & Units 04/17/2023   11:04 AM 03/03/2023   12:00 AM 01/27/2023   12:00 AM  CMP  Glucose 70 - 99 mg/dL 440     BUN 6 - 20 mg/dL 24  30     27       Creatinine 0.61 - 1.24 mg/dL 1.02  0.9     1.0      Sodium 135 - 145 mmol/L 135  137     140      Potassium 3.5 - 5.1 mmol/L 4.0  3.9     4.1  Chloride 98 - 111 mmol/L 100  99     102      CO2 22 - 32 mmol/L 27  26     25       Calcium 8.9 - 10.3 mg/dL 9.0  9.2     9.7      Total Protein 6.5 - 8.1 g/dL 7.5     Total  Bilirubin 0.3 - 1.2 mg/dL 0.9     Alkaline Phos 38 - 126 U/L 56  67     65      AST 15 - 41 U/L 19  40     37      ALT 0 - 44 U/L 24  43     48         This result is from an external source.    STUDIES:       HISTORY:   Allergies: No Known Allergies  Current Medications: Current Outpatient Medications  Medication Sig Dispense Refill   acetaminophen (TYLENOL) 500 MG tablet Take 500 mg by mouth every 4 (four) hours as needed for moderate pain.     amLODipine (NORVASC) 10 MG tablet Take 10 mg by mouth daily.     Ascorbic Acid (VITAMIN C) 1000 MG tablet Take 1,000 mg by mouth daily.     azelastine (ASTELIN) 0.1 % nasal spray Place 1 spray into both nostrils every 12 (twelve) hours.     Bempedoic Acid-Ezetimibe (NEXLIZET) 180-10 MG TABS Take 1 tablet by mouth daily in the afternoon. 90 tablet 3   Bempedoic Acid-Ezetimibe (NEXLIZET) 180-10 MG TABS Take 1 tablet by mouth daily in the afternoon.     benazepril (LOTENSIN) 20 MG tablet Take 1 tablet (20 mg total) by mouth daily. 90 tablet 3   CALCIUM PO Take 1 tablet by mouth daily.     DULoxetine (CYMBALTA) 30 MG capsule Take 30 mg by mouth daily.     DULoxetine (CYMBALTA) 60 MG capsule Take 60 mg by mouth daily.     escitalopram (LEXAPRO) 20 MG tablet Take 20 mg by mouth daily.     folic acid (FOLVITE) 1 MG tablet TAKE ONE TABLET BY MOUTH ONCE DAILY 90 tablet 3   furosemide (LASIX) 40 MG tablet Take 1 tablet (40 mg total) by mouth daily. 30 tablet 3   metFORMIN (GLUCOPHAGE-XR) 500 MG 24 hr tablet Take 1,000 mg by mouth 2 (two) times daily.     metoprolol succinate (TOPROL-XL) 100 MG 24 hr tablet Take 1 tablet (100 mg total) by mouth daily. Take with or immediately following a meal. 90 tablet 3   NON FORMULARY Take 700 mg by mouth daily. Apple pectin      omeprazole (PRILOSEC) 20 MG capsule Take 20 mg by mouth daily.     potassium chloride SA (KLOR-CON M) 20 MEQ tablet Take 2 tablets (40 mEq total) by mouth 3 (three) times daily. 180  tablet 5   predniSONE (DELTASONE) 5 MG tablet Take 1 tablet (5 mg total) by mouth daily with breakfast. 90 tablet 1   vitamin B-12 (CYANOCOBALAMIN) 1000 MCG tablet Take 1,000 mcg by mouth daily.     vitamin k 100 MCG tablet Take 100 mcg by mouth daily.     No current facility-administered medications for this visit.    ASSESSMENT & PLAN:  Assessment:  1. ITP: Significant thrombocytopenia, which has been fluctuating up and down since June 2020.  This is chronic immune thrombocytopenic purpura.  We placed him on prednisone 20  mg three times daily in early June 2020.  We were unable to taper his steroids, so he underwent rituximab therapy weekly for 4 weeks in October 2021 with a partial response.  He is not a good candidate for splenectomy unless absolutely necessary.  His platelet count had remained above 75,000, we have steadily tapered his prednisone and prior to his hospital admission in May 2022, he was on prednisone 5mg  daily. He was admitted for COVID and placed back on high dose prednisone, with an extremely slow taper, down to 2.5 mg daily.  His platelet count worsened in August 2022, so we increased the dose back to 5 mg daily, but he has not normalized, so remains on this dose.  His platelet count continues to fluctuate up and down for the last two years and dropped to 83,000. I cannot explain this but it has been going up and down like this for years.  Today it is stable at 98,000.   2. Steroid induced diabetes, for which he continues metformin 500 mg twice daily and glipizide 5 mg daily.     3.  Elevated Epstein Barr titers.  This may be indicative of chronic fatigue syndrome, and I advised that he rest as needed.  4.  Sleep apnea. I have encouraged him to use his CPAP mask for better quality of life and to reduce his daytime sleepiness.  He has been referred to  Pulmonary.   5.  Chronic anxiety, mainly related to his health, and also to his job and insurance issues. I encouraged him to  start the Buspar 5 mg. We did have him seeing our LCSW for counseling, and so we will refer him to our new one.   6.  Hospital admission with acute kidney injury due to dehydration associated with COVID-19, May 2022.   He was placed on an extremely slow prednisone taper but we were unable to completely taper him off.    7.  Significant left hip and lower back pain due to osteoarthritis and degenerative disc disease.  He is following with a spine specialist.  He is undergoing physical therapy. MRI of the lumbar spine revealed degenerative changes of L4-5. His surgeon does not feel comfortable doing any invasive procedure or surgery with his persistent thrombocytopenia.  8.  Elevated bilirubin which has persisted but remains stable. I suspect possible Gilbert's syndrome as his sister has the same trait but we will hold off on ordering [id number] as it would not change our plan of care.  9. Episodes of pre-syncope which are unexplained and could be cardiac or medication or hypoglycemia. In view of the possibility of a petit mal seizure or CNS hemorrhage, a brain MRI was done. It was then repeated with contrast. His MRI of the head on 12/08/2022 shows abnormal size and shape of the ventricles with asymmetric enlargement of the left frontal horn. We don't know if this is congenital or whether it represents hydrocephalus.  A repeat scan was suggested in 6 months, in October to see if there is any difference. I have referred him to a neurosurgeon who does not seem very concerned about this but will repeat in 3 months, which will be 6 months from the April scan. He was referred to a  neurologist, Dr Teresa Coombs to have an EEG done. He has been prescribed keppra for possible seizures but we prefer to wait until we have a definitive diagnosis from the neurologist before starting that. I agree it is not likely to affect  his platelet count.  If he starts having more episodes he can commence the medication.    Plan:     He  informed me that he will lose his insurance on August 31st and won't be able to get another one reinstated until mid November. They inquired about getting a sample of potassium or ways to decrease medication costs. I will discuss this with Mike [name] our Child psychotherapist. We will get him a referral again to the LCSW for counseling. He continues Prednisone 5 mg daily. They will see a neurologist on 04/24/2023 for follow-up. His labs today are pending. I will see him back in 3 months with CBC and CMP. The patient understands the plans discussed today and are in agreement with them. They know to contact our office if he develops concerns prior to his next appointment.   I provided 23 minutes of face-to-face time during this this encounter and > 50% was spent counseling as documented under my assessment and plan.     I,Mike [name],acting as a scribe for Dellia Beckwith, MD.,have documented all relevant documentation on the behalf of Dellia Beckwith, MD,as directed by  Dellia Beckwith, MD while in the presence of Dellia Beckwith, MD.

## 2023-04-27 LAB — VITAMIN D 1,25 DIHYDROXY
Vitamin D 1, 25 (OH)2 Total: 30 pg/mL
Vitamin D2 1, 25 (OH)2: <10 pg/mL
Vitamin D3 1, 25 (OH)2: 27 pg/mL

## 2023-04-30 ENCOUNTER — Other Ambulatory Visit: Payer: Medicaid Other

## 2023-04-30 ENCOUNTER — Ambulatory Visit: Payer: Medicaid Other | Admitting: Oncology

## 2023-05-01 ENCOUNTER — Ambulatory Visit: Payer: Medicaid Other | Admitting: Neurology

## 2023-05-01 ENCOUNTER — Encounter: Payer: Self-pay | Admitting: Neurology

## 2023-05-01 VITALS — BP 120/73 | HR 47 | Ht 71.0 in | Wt 208.0 lb

## 2023-05-01 DIAGNOSIS — R569 Unspecified convulsions: Secondary | ICD-10-CM | POA: Diagnosis not present
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
## 2023-05-01 NOTE — Patient Instructions (Signed)
Routine EEG, I will call you to go over the results.  Please contact me if you do have another event Please set up care with a psychiatrist and psychologist for management of your anxiety with agoraphobia Continue to follow with PCP

## 2023-05-05 ENCOUNTER — Other Ambulatory Visit: Payer: Medicaid Other

## 2023-05-05 ENCOUNTER — Other Ambulatory Visit: Payer: Self-pay

## 2023-05-05 ENCOUNTER — Telehealth: Payer: Self-pay

## 2023-05-05 ENCOUNTER — Encounter: Payer: Self-pay | Admitting: Oncology

## 2023-05-05 ENCOUNTER — Ambulatory Visit: Payer: Medicaid Other | Admitting: Oncology

## 2023-05-05 DIAGNOSIS — D696 Thrombocytopenia, unspecified: Secondary | ICD-10-CM

## 2023-05-05 DIAGNOSIS — D693 Immune thrombocytopenic purpura: Secondary | ICD-10-CM

## 2023-05-05 NOTE — Progress Notes (Signed)
Spoke with Jabe's sister, [name], he has used all of the funding for the [employer]. I spoke with her about the GoodRx card, and they can save a lot of money on his medications this way. Will hold off on applying for financial assistance through [place] and [place] unless he has an appointment before November.

## 2023-05-05 NOTE — Telephone Encounter (Signed)
Referral placed.

## 2023-05-05 NOTE — Telephone Encounter (Signed)
-----   Message from Mady Haagensen sent at 05/05/2023  8:14 AM EDT ----- Regarding: RE: help Can you make the referral to the new Social Worker Marylene Land, it will not let me do this. Thank you ----- Message ----- From: Dellia Beckwith, MD Sent: 05/01/2023   8:20 PM EDT To: Mady Haagensen; Jeannette Corpus, LPN; # Subject: help                                           He tells me he will have no insurance after 8/31 and hopes to get by mid November.  Can we help him with some of his meds until then?  I told him to go to his cardiologist for cardiac meds but I prescribe his prednisone and this has caused his diabetes.  I also order his potassium. Let me know if I need to send in new prescriptions  He needs to get connected with the new LCSW, Nadiyah, for counseling as he is anxious as ever

## 2023-05-06 ENCOUNTER — Inpatient Hospital Stay: Payer: Self-pay | Attending: Oncology

## 2023-05-06 NOTE — Progress Notes (Signed)
Amendment to correct the name of person spoke with from [facility] to [facility].

## 2023-05-06 NOTE — Progress Notes (Signed)
[facility] Clinical Social Work  Clinical Social Work was referred by nurse for assessment of psychosocial needs due to increasing levels of depression and anxiety. CSW attempted to reach patient via phone, CSW made contact with patient's health care proxy, [name]. Patient's health care proxy explained presenting concerns from her perspective and provided basic mental health history. CSW offered to complete initial assessment with patient next Wednesday, [name] will confirm with patient availability and reach back out.   Marguerita Merles, LCSWA Clinical Social Worker [facility]

## 2023-05-07 ENCOUNTER — Telehealth: Payer: Self-pay

## 2023-05-07 NOTE — Telephone Encounter (Signed)
Mike [name] patient sister will notify patient of lab results and to decrease his potassium to twice a day

## 2023-05-07 NOTE — Telephone Encounter (Signed)
-----   Message from Dellia Beckwith sent at 05/05/2023  7:40 PM EDT ----- Regarding: call Tell him potassium is good at 4.0 and so he can decrease his supplement to twice daily.  Blood sugar looks great at 104 and his hemoglobin A1c is excellent at 4.8.  His platelet count is 98,000 and the rest is completely normal including his cholesterol panel.  Did we send copies to Vivia Birmingham, NP with [facility]?

## 2023-05-13 ENCOUNTER — Inpatient Hospital Stay: Payer: Self-pay

## 2023-05-13 NOTE — Progress Notes (Signed)
[facility] CSW Counseling Note  Patient was referred by self. Treatment type: Individual  Presenting Concerns: Patient and/or family reports the following symptoms/concerns: anxiety Duration of problem: Patient reported general challenges with anxiety and stated increase in anxiety approx. 4 years  at the start of treatment.Severity of problem: At this time, patient described symptoms that indicate mild severity. However, patient reports that severity of anxiety changes depending on the day.    Orientation:oriented to person, place, and time/date.   Affect: Congruent Risk of harm to self or others: No plan to harm self or others  Patient and/or Family's Strengths/Protective Factors: Social connections, Social and Emotional competence, and Concrete supports in place (healthy food, safe environments, etc.)Ability for insight  Active sense of humor  Average or above average intelligence  Capable of independent living  Arboriculturist fund of knowledge  Supportive family/friends      Goals Addressed: Patient will:  Reduce symptoms of: anxiety Increase knowledge and/or ability of: coping skills, self-management skills, and stress reduction  Increase healthy adjustment to current life circumstances   Progress towards Goals: Progressing   Interventions: Interventions utilized:  CBT, Solution Focused, Strength-based, Supportive, and Reframing      Assessment: Patient re-initiated counseling services after former provider left agency to receive assistance with managing anxiety. Csw received hx,duration, and frequency with anxiety symptoms, and current impact to daily life. At this time, patient described social settings and managing disability through providers as triggers for anxiety. Patient described steps taken towards attempting to managing anxiety in social situations and challenges with the coping skills (desire to run out of store, inability to stay in  store). CSW and patient discussed thoughts / emotions. CSW provided Psychoeducational on anxiety cycle.      Plan: Follow up with CSW: 2 weeks Behaioral recommendations: recognize steps taken during anxious episodes, write down thoughts / environmental cues during episode.  Marguerita Merles, [address]

## 2023-05-27 ENCOUNTER — Inpatient Hospital Stay: Payer: Self-pay

## 2023-05-27 NOTE — Progress Notes (Signed)
[facility] CSW Counseling Note  Patient was referred by self. Treatment type: Individual  Presenting Concerns: Patient and/or family reports the following symptoms/concerns: anxiety Duration of problem: 3 years Severity of problem: mild   Orientation:oriented to person, place, and time/date.   Affect: Congruent Risk of harm to self or others: No plan to harm self or others  Patient and/or Family's Strengths/Protective Factors: Social connections, Social and Emotional competence, Concrete supports in place (healthy food, safe environments, etc.), Sense of purpose, and Physical Health (exercise, healthy diet, medication compliance, etc.)Ability for insight  Active sense of humor  Average or above average intelligence  Capable of independent living  Communication skills  General fund of knowledge  Motivation for treatment/growth      Goals Addressed: Patient will:  Reduce symptoms of: anxiety Increase knowledge and/or ability of: coping skills, self-management skills, and stress reduction  Increase healthy adjustment to current life circumstances   Progress towards Goals: Progressing   Interventions: Interventions utilized:  CBT, Strength-based, Supportive, and Reframing      Assessment: Patient currently experiencing anxiety related to health condition that impacts daily living.      Plan: Follow up with CSW: 2 weeks Behavioral recommendations: Grounding techniques    Marguerita Merles, [address]

## 2023-05-28 ENCOUNTER — Telehealth: Payer: Self-pay | Admitting: Cardiology

## 2023-05-28 ENCOUNTER — Other Ambulatory Visit: Payer: Self-pay | Admitting: Oncology

## 2023-05-28 DIAGNOSIS — I2510 Atherosclerotic heart disease of native coronary artery without angina pectoris: Secondary | ICD-10-CM

## 2023-05-28 DIAGNOSIS — D693 Immune thrombocytopenic purpura: Secondary | ICD-10-CM

## 2023-05-28 DIAGNOSIS — E782 Mixed hyperlipidemia: Secondary | ICD-10-CM

## 2023-05-28 NOTE — Telephone Encounter (Signed)
Patient calling the office for samples of medication:   1.  What medication and dosage are you requesting samples for? Bempedoic Acid-Ezetimibe (NEXLIZET) 180-10 MG TABS   2.  Are you currently out of this medication? Patient's sister is going to be in [place] today and would like to come by to get samples of the medication for the patient. Patient's sister would like a call back for an update. Please advise.

## 2023-05-28 NOTE — Telephone Encounter (Signed)
Advised that we do not have samples.

## 2023-05-29 MED ORDER — NEXLIZET 180-10 MG PO TABS
1.0000 | ORAL_TABLET | Freq: Every day | ORAL | 0 refills | Status: DC
Start: 2023-05-29 — End: 2023-07-08

## 2023-05-29 NOTE — Addendum Note (Signed)
Addended by: Freddi Starr on: 05/29/2023 04:25 PM   Modules accepted: Orders

## 2023-05-29 NOTE — Telephone Encounter (Signed)
56patient aware samples are here for pick up.

## 2023-06-10 ENCOUNTER — Inpatient Hospital Stay: Payer: Self-pay | Attending: Oncology

## 2023-06-10 NOTE — Progress Notes (Signed)
[facility] CSW Counseling Note  Patient was referred by self. Treatment type: Individual  Presenting Concerns: Patient and/or family reports the following symptoms/concerns: anxiety Duration of problem: 3 years; Severity of problem: mild   Orientation:oriented to person, place, and time/date.   Affect: Congruent Risk of harm to self or others: No plan to harm self or others  Patient and/or Family's Strengths/Protective Factors: Social and Emotional competence, Concrete supports in place (healthy food, safe environments, etc.), and Caregiver has knowledge of parenting & child developmentAbility for insight      Goals Addressed: Patient will:  Reduce symptoms of: anxiety Increase knowledge and/or ability of: coping skills and stress reduction  Increase healthy adjustment to current life circumstances   Progress towards Goals: Progressing   Interventions: Interventions utilized:  DBT and Reframing      Assessment: Patient's primary source of concern is anxiety that has continued post treatment. Patient has shown an improvement with management of anxiety that is evident through increased ability to visit anxiety provoking places.      Plan: Follow up with CSW: 2 weeks Behavioral recommendations: Identify if thoughts that are developed are rational and logical or Fear./Anxiety based      Marguerita Merles, [address]

## 2023-06-17 ENCOUNTER — Telehealth: Payer: Self-pay

## 2023-06-17 NOTE — Telephone Encounter (Signed)
[facility] CSW Progress Note  Clinical Social Worker  attempted to contact patient and sister   to discuss 10/23 appointment. CSW left vm and informed patient due to office being closed, the appointment will need to be rescheduled for 10/30.     Marguerita Merles, LCSWA Clinical Social Worker [facility]

## 2023-06-18 ENCOUNTER — Telehealth: Payer: Self-pay | Admitting: Pharmacy Technician

## 2023-06-18 ENCOUNTER — Other Ambulatory Visit (HOSPITAL_COMMUNITY): Payer: Self-pay

## 2023-06-18 ENCOUNTER — Telehealth: Payer: Self-pay | Admitting: Cardiology

## 2023-06-18 NOTE — Telephone Encounter (Signed)
Patient's insurance agent is requesting Dr. Tomie China fill out a formulary acception form for Nexlizet so that they can send it to the insurance company to try to get Nexlizet covered as it is not at all covered by his BCBS plan. Please call patient's sister [name] who is his POA for more information- her number is [phone]. They also need samples if possible, he will be completely out in a week.   Thank you

## 2023-06-18 NOTE — Telephone Encounter (Signed)
Prior Berkley Harvey was requested for nexlizet- reached out to patient to confirm insurance information as none was found in the system. Will submit once that is updated.

## 2023-06-24 ENCOUNTER — Other Ambulatory Visit: Payer: Self-pay

## 2023-07-01 ENCOUNTER — Inpatient Hospital Stay: Payer: Self-pay

## 2023-07-01 NOTE — Progress Notes (Signed)
[facility] CSW Counseling Note  Patient was referred by self. Treatment type: Individual  Presenting Concerns: Patient and/or family reports the following symptoms/concerns: agitation and anxiety Duration of problem: 3  years; Severity of problem: mild   Orientation:oriented to person, place, and time/date.   Affect: Congruent Risk of harm to self or others: No plan to harm self or others  Patient and/or Family's Strengths/Protective Factors: Social connections, Social and Emotional competence, Concrete supports in place (healthy food, safe environments, etc.), Sense of purpose, and Physical Health (exercise, healthy diet, medication compliance, etc.)Ability for insight  Active sense of humor  Average or above average intelligence  Capable of independent living  Communication skills  Financial means      Goals Addressed: Patient will:  Reduce symptoms of: agitation and anxiety Increase knowledge and/or ability of: coping skills and stress reduction  Increase healthy adjustment to current life circumstances   Progress towards Goals: Progressing   Interventions: Interventions utilized:  CBT      Assessment: Patient currently experiencing anxiety and agitation related to continued stress with disability company. Patient described social anxiety like symptoms when speaking with strangers. Patient's management with anxiety is improving indicated by management of emotions during anxiety proving situations.        Plan: Follow up with CSW: 2 weeks       Marguerita Merles, [address]

## 2023-07-07 ENCOUNTER — Telehealth: Payer: Self-pay | Admitting: Cardiology

## 2023-07-07 DIAGNOSIS — I10 Essential (primary) hypertension: Secondary | ICD-10-CM

## 2023-07-07 MED ORDER — FUROSEMIDE 40 MG PO TABS: 40.0000 mg | ORAL_TABLET | Freq: Every day | ORAL | 2 refills | Status: DC

## 2023-07-07 NOTE — Telephone Encounter (Signed)
RX sent

## 2023-07-07 NOTE — Telephone Encounter (Signed)
*  STAT* If patient is at the pharmacy, call can be transferred to refill team.   1. Which medications need to be refilled? (please list name of each medication and dose if known)   furosemide (LASIX) 40 MG tablet      4. Which pharmacy/location (including street and city if local pharmacy) is medication to be sent to[place] [facility] [place] [address]     5. Do they need a 30 day or 90 day supply? 90

## 2023-07-08 ENCOUNTER — Other Ambulatory Visit (HOSPITAL_COMMUNITY): Payer: Self-pay

## 2023-07-08 ENCOUNTER — Telehealth: Payer: Self-pay | Admitting: Cardiology

## 2023-07-08 ENCOUNTER — Telehealth: Payer: Self-pay

## 2023-07-08 DIAGNOSIS — E782 Mixed hyperlipidemia: Secondary | ICD-10-CM

## 2023-07-08 DIAGNOSIS — I2510 Atherosclerotic heart disease of native coronary artery without angina pectoris: Secondary | ICD-10-CM

## 2023-07-08 MED ORDER — NEXLIZET 180-10 MG PO TABS: 1.0000 | ORAL_TABLET | Freq: Every day | ORAL | 3 refills | Status: DC

## 2023-07-08 MED ORDER — NEXLIZET 180-10 MG PO TABS
1.0000 | ORAL_TABLET | Freq: Every day | ORAL | 0 refills | Status: DC
Start: 2023-07-08 — End: 2023-07-08

## 2023-07-08 NOTE — Telephone Encounter (Signed)
Pt c/o medication issue:  1. Name of Medication:   Bempedoic Acid-Ezetimibe (NEXLIZET) 180-10 MG TABS   2. How are you currently taking this medication (dosage and times per day)?   As prescribed  3. Are you having a reaction (difficulty breathing--STAT)?   No  4. What is your medication issue?   Sister [name]) stated patient is unable to afford this medication and wants to get samples.  Sister stated patient has completed forms to get this medication on patient's insurance formulary  Sister noted patient is out of this medication.

## 2023-07-08 NOTE — Telephone Encounter (Signed)
Could you please do a tier exception or formulary exception for the pt's Nexlizet? H cannot take any other statin due to his platelets.

## 2023-07-08 NOTE — Telephone Encounter (Signed)
Mike [name] is aware samples have been placed for pickup.

## 2023-07-08 NOTE — Telephone Encounter (Signed)
RX has been sent.

## 2023-07-08 NOTE — Telephone Encounter (Signed)
Patient Advocate Encounter   The patient was approved for a [employer] grant that will help cover the cost of NEXLIZET Total amount awarded, $2500.  Effective: 06/08/23 - 06/06/24   [id number]  Haze Rushing, CPhT  Pharmacy Patient Advocate Specialist  Direct Number: [phone] Fax: [phone]

## 2023-07-08 NOTE — Telephone Encounter (Signed)
Please send in prescription for NEXLIZET to  [facility] [place] [facility] [place] - [address] [place], [place] [address]   including grant billing information in RX comment [phone], HKV:[id number], Group: [id number], RJ:[id number])

## 2023-07-15 ENCOUNTER — Inpatient Hospital Stay: Payer: Medicare Other | Attending: Hematology and Oncology

## 2023-07-15 DIAGNOSIS — G4730 Sleep apnea, unspecified: Secondary | ICD-10-CM | POA: Insufficient documentation

## 2023-07-15 DIAGNOSIS — Z79899 Other long term (current) drug therapy: Secondary | ICD-10-CM | POA: Insufficient documentation

## 2023-07-15 DIAGNOSIS — Z7984 Long term (current) use of oral hypoglycemic drugs: Secondary | ICD-10-CM | POA: Insufficient documentation

## 2023-07-15 DIAGNOSIS — F419 Anxiety disorder, unspecified: Secondary | ICD-10-CM | POA: Insufficient documentation

## 2023-07-15 DIAGNOSIS — E099 Drug or chemical induced diabetes mellitus without complications: Secondary | ICD-10-CM | POA: Insufficient documentation

## 2023-07-15 DIAGNOSIS — R55 Syncope and collapse: Secondary | ICD-10-CM | POA: Insufficient documentation

## 2023-07-15 DIAGNOSIS — D693 Immune thrombocytopenic purpura: Secondary | ICD-10-CM | POA: Insufficient documentation

## 2023-07-15 DIAGNOSIS — Z7952 Long term (current) use of systemic steroids: Secondary | ICD-10-CM | POA: Insufficient documentation

## 2023-07-15 DIAGNOSIS — Z803 Family history of malignant neoplasm of breast: Secondary | ICD-10-CM | POA: Insufficient documentation

## 2023-07-15 NOTE — Progress Notes (Signed)
[facility] CSW Counseling Note  Patient was referred by self. Treatment type: Individual  Presenting Concerns: Patient and/or family reports the following symptoms/concerns: agitation, anxiety, and stress Duration of problem: 3 years  Severity of problem: moderate   Orientation:oriented to person, place, and time/date.   Affect: Congruent Risk of harm to self or others: No plan to harm self or others  Patient and/or Family's Strengths/Protective Factors: Social connections, Social and Emotional competence, Concrete supports in place (healthy food, safe environments, etc.), Sense of purpose, and Physical Health (exercise, healthy diet, medication compliance, etc.)Ability for insight  Active sense of humor  Average or above average intelligence  Capable of independent living  Communication skills  General fund of knowledge  Motivation for treatment/growth  Physical Health  Religious Affiliation  Special hobby/interest  Supportive family/friends      Goals Addressed: Patient will:  Reduce symptoms of: agitation, anxiety, and stress Increase knowledge and/or ability of: coping skills, healthy habits, self-management skills, and stress reduction  Increase healthy adjustment to current life circumstances   Progress towards Goals: Progressing   Interventions: Interventions utilized:  DBT and Solution Focused      Assessment: Patient currently experiencing increased stress related to ongoing disability conversations and financial impact related to his daughter. Patient's increase of stress is experienced on average daily and leads to patient having emotional responses.     Plan: Follow up with CSW: Three weeks on December 4th.  Behavioral recommendations: Implement stress management techniques: Coping Skills such as working out, Teacher, adult education to manage emotions, and refraining from stresful activities when experiencing dysregulation.        Marguerita Merles, LCSW

## 2023-07-16 NOTE — Progress Notes (Signed)
[facility]  [address] [place],  [place]  [address] [phone]  Clinic Day:  07/17/2023  Referring physician: Julianne Handler, NP   CHIEF COMPLAINT:  CC: Chronic ITP  Current Treatment: Prednisone 5 mg daily  HISTORY OF PRESENT ILLNESS:  Mike [name] is a 52 y.o. male with a history of severe thrombocytopenia consistent with immune thrombocytopenic purpura diagnosed in May 2021.  Further laboratory evaluation did not reveal any other specific etiology for his thrombocytopenia.  He had mildly elevated liver transaminases.  Abdominal ultrasound revealed increased echogenicity of hepatic parenchyma suggesting hepatic steatosis, with probable fatty sparing adjacent to gallbladder fossa.  No other abnormality was seen.  We recommended bone marrow examination, but the patient and his mother declined.  The platelet count continued to fluctuate up and down, but as his platelet count remained 25,000 in June 2021, he was started on prednisone 20 mg 3 times daily, with a good response.  He has had leukocytosis secondary to the high-dose prednisone.  He also has had oral candidiasis on occasion. Malachi Carl titers were elevated, so this may be representative of chronic fatigue syndrome. We were unable to taper him off the prednisone, so he was treated with rituximab weekly for 4 weeks in October and November 2021.  We slowly tapered the prednisone after that.  He had complications of significant weight gain, hypertension, steroid induced diabetes and steroid induced myopathy. He has also had hypokalemia resolved with oral supplementation.     On May 18th, 2022 his platelet count was 151,000, so prednisone was decreased to 5 mg daily. He was admitted with COVID-19 on May 24th after leaving work earlier due to altered mental status and generalized weakness. He had acute kidney injury with a creatinine of 2.2, which normalized with IV hydration. He had fever, but no respiratory  symptoms. He was discharged on a prolonged prednisone taper over 15 weeks.  He had been having some chest pain and dyspnea since discontinuing indapamide, so was referred to Dr. Tomie China in Cardiology. He placed him on furosemide 40 mg daily and metoprolol 50 mg daily.  He had had problems with hypokalemia for a long time, but this worsened with the diuretic, so potassium was increased to 40 mg twice daily.  The lower extremity edema has resolved with the furosemide.  ECHO revealed left ventricular hypertrophy with an EF between 55-60%, and the CT of the coronary arteries looks good.  He was placed on gabapentin 300 mg BID for neuropathy.  He was on a extremely slow taper of the prednisone, but his platelets dropped to 73,000 when the dose was decreased to 5 mg three times per wee, so we resumed 5 mg daily.  Gabapentin was discontinued as it wasn't effective.  MRI lumbar spine in October 2022 revealed degenerative changes of the lumbar spine most prominent L4-5.  He was recently referred to spinal specialist.  He was hesitant to undergo epidural injection due to thrombocytopenia, although his platelets were usually above 75,000.  He has remained on prednisone 5 mg daily and his platelet count has remained above 75,000 since October 2022.  We began to space out his visits over time.  He has had mild elevation of the bilirubin, felt to be most likely Gilbert's syndrome.  Testing was not done. He has been able to lose weight over the years.  He has struggled with chronic anxiety and was previously seen by psychiatry.  He began seeing our licensed clinical social  worker in August 2023 for counseling.   He reported presyncopal episodes so underwent MRI head in March 2024.  He initially declined contrast, but there were some abnormalities, so he underwent MRI head with contrast in April which revealed stable size and shape of the ventricular system from March with asymmetric enlargement of the left frontal horn. No  contrast-enhancing lesion is visualized to suggest an obstructive cause for ventricular configuration. There were few vessels that course along the region of the foramen of Monro. Overall, this was a nonspecific finding and may be congenital. Follow up brain MRI with a CISS sequence is recommended in 6 months to assess for stability and exclude an underlying lesion. Additionally, earlier follow up was recommended if there is an acute change in patient's symptoms (worsening headaches or new neurologic symptoms) to exclude the possibility of hydrocephalus.  We referred him to neurosurgery who subsequently referred him to neurology.  INTERVAL HISTORY:  Mike [name] is here today for repeat clinical assessment.  He denies abnormal bleeding or bruising.  He denies recurrent episodes of presyncope.  He continues to meet with the licensed clinical social worker for his anxiety/agitation and stress, which is helping him.  He has been able to get off of glipizide.  His hemoglobin A1c was 4.8 in August.  He reports pain in his lower back.  He has persistent numbness of the bilateral hands.  He denies fevers or chills. His appetite is good, he continues to try to lose weight. His weight has decreased 15 pounds over last 3 months . He continues to follow with the spinal specialist and neurologist.  He states he is scheduled for an EEG a  he thought a repeat MRI head, but the MRI has not been scheduled.  He will ask the neurologist about this.He had recent Cologuard testing which was negative.  REVIEW OF SYSTEMS:  Review of Systems  Constitutional:  Negative for appetite change, chills, fatigue, fever and unexpected weight change.  HENT:   Negative for lump/mass, mouth sores, nosebleeds and sore throat.   Respiratory:  Negative for cough and shortness of breath.   Cardiovascular:  Negative for chest pain and leg swelling.  Gastrointestinal:  Negative for abdominal pain, blood in stool, constipation, diarrhea, nausea and  vomiting.  Genitourinary:  Negative for difficulty urinating, dysuria, frequency and hematuria.   Musculoskeletal:  Positive for back pain (Lower back). Negative for arthralgias and myalgias.  Skin:  Negative for itching, rash and wound.  Neurological:  Positive for numbness (Bilateral hands). Negative for dizziness, extremity weakness, headaches and light-headedness.  Hematological:  Negative for adenopathy. Does not bruise/bleed easily.  Psychiatric/Behavioral:  Negative for depression and sleep disturbance. The patient is not nervous/anxious.      VITALS:  Blood pressure 109/68, pulse (!) 54, temperature 97.7 F (36.5 C), temperature source Oral, resp. rate 20, height 5\' 11"  (1.803 m), weight 196 lb 8 oz (89.1 kg), SpO2 100%.  Wt Readings from Last 3 Encounters:  07/17/23 196 lb 8 oz (89.1 kg)  05/01/23 208 lb (94.3 kg)  04/17/23 211 lb 3.2 oz (95.8 kg)    Body mass index is 27.41 kg/m.  Performance status (ECOG): 1 - Symptomatic but completely ambulatory  PHYSICAL EXAM:  Physical Exam Vitals and nursing note reviewed.  Constitutional:      General: He is not in acute distress.    Appearance: Normal appearance. He is normal weight.  HENT:     Head: Normocephalic and atraumatic.     Mouth/Throat:  Mouth: Mucous membranes are moist.     Pharynx: Oropharynx is clear. No oropharyngeal exudate or posterior oropharyngeal erythema.  Eyes:     General: No scleral icterus.    Extraocular Movements: Extraocular movements intact.     Conjunctiva/sclera: Conjunctivae normal.     Pupils: Pupils are equal, round, and reactive to light.  Cardiovascular:     Rate and Rhythm: Normal rate and regular rhythm.     Heart sounds: Normal heart sounds. No murmur heard.    No friction rub. No gallop.  Pulmonary:     Effort: Pulmonary effort is normal.     Breath sounds: Normal breath sounds. No wheezing, rhonchi or rales.  Abdominal:     General: Bowel sounds are normal. There is no  distension.     Palpations: Abdomen is soft. There is no hepatomegaly, splenomegaly or mass.     Tenderness: There is no abdominal tenderness.  Musculoskeletal:        General: Normal range of motion.     Cervical back: Normal range of motion and neck supple. No tenderness.     Right lower leg: No edema.     Left lower leg: No edema.  Lymphadenopathy:     Cervical: No cervical adenopathy.     Upper Body:     Right upper body: No supraclavicular or axillary adenopathy.     Left upper body: No supraclavicular or axillary adenopathy.     Lower Body: No right inguinal adenopathy. No left inguinal adenopathy.  Skin:    General: Skin is warm and dry.     Coloration: Skin is not jaundiced.     Findings: No rash.  Neurological:     Mental Status: He is alert and oriented to person, place, and time.     Cranial Nerves: No cranial nerve deficit.  Psychiatric:        Mood and Affect: Mood normal.        Behavior: Behavior normal.        Thought Content: Thought content normal.     LABS:      Latest Ref Rng & Units 07/17/2023    8:07 AM 04/17/2023   11:04 AM 03/03/2023   12:00 AM  CBC  WBC 4.0 - 10.5 K/uL 8.4  7.4  9.3      Hemoglobin 13.0 - 17.0 g/dL 82.9  56.2  13.0      Hematocrit 39.0 - 52.0 % 45.2  41.5  42      Platelets 150 - 400 K/uL 108  98  87         This result is from an external source.      Latest Ref Rng & Units 07/17/2023    8:07 AM 04/17/2023   11:04 AM 03/03/2023   12:00 AM  CMP  Glucose 70 - 99 mg/dL 865  784    BUN 6 - 20 mg/dL 28  24  30       Creatinine 0.61 - 1.24 mg/dL 6.96  2.95  0.9      Sodium 135 - 145 mmol/L 138  135  137      Potassium 3.5 - 5.1 mmol/L 4.2  4.0  3.9      Chloride 98 - 111 mmol/L 99  100  99      CO2 22 - 32 mmol/L 29  27  26       Calcium 8.9 - 10.3 mg/dL 28.4  9.0  9.2      Total  Protein 6.5 - 8.1 g/dL 7.8  7.5    Total Bilirubin <1.2 mg/dL 1.0  0.9    Alkaline Phos 38 - 126 U/L 78  56  67      AST 15 - 41 U/L 27  19  40       ALT 0 - 44 U/L 30  24  43         This result is from an external source.     STUDIES:  No results found.    HISTORY:   Past Medical History:  Diagnosis Date   Acute kidney injury (HCC)    Allergic rhinitis with postnasal drip    Allergy    Anemia    CAD (coronary artery disease) 05/17/2021   Diuretic-induced hypokalemia 05/02/2021   Dyspnea on exertion    Folate deficiency anemia 05/02/2021   GERD (gastroesophageal reflux disease)    GERD without esophagitis    Hyperbilirubinemia 08/03/2019   Hypertension    Hyponatremia    Immune thrombocytopenic purpura (HCC)    Immune thrombocytopenic purpura (HCC)    Metabolic syndrome    Mixed dyslipidemia 05/17/2021   Morbid obesity (HCC)    Obesity (BMI 35.0-39.9 without comorbidity) 04/18/2021   Obstructive sleep apnea hypopnea, moderate    Post-COVID syndrome    Shortness of breath    Sleep apnea    wears a mouth piece   Steroid-induced diabetes (HCC)    Stress and adjustment reaction    Syncope 10/30/2022   Thrombocytopenia, unspecified (HCC)     Past Surgical History:  Procedure Laterality Date   NO PAST SURGERIES      Family History  Problem Relation Age of Onset   Heart attack Father    Thrombosis Maternal Aunt        fatal   Thrombosis Maternal Uncle        fatal   Heart attack Paternal Uncle    Breast cancer Maternal Grandmother        80s   Thrombosis Maternal Grandmother        fatal   Heart attack Paternal Grandfather     Social History:  reports that he has never smoked. He has quit using smokeless tobacco. He reports that he does not currently use alcohol. He reports that he does not currently use drugs.The patient is alone today.  Allergies: No Active Allergies  Current Medications: Current Outpatient Medications  Medication Sig Dispense Refill   acetaminophen (TYLENOL) 500 MG tablet Take 500 mg by mouth every 4 (four) hours as needed for moderate pain.     amLODipine (NORVASC) 10 MG  tablet Take 10 mg by mouth daily.     Ascorbic Acid (VITAMIN C) 1000 MG tablet Take 1,000 mg by mouth daily.     azelastine (ASTELIN) 0.1 % nasal spray Place 1 spray into both nostrils every 12 (twelve) hours.     Bempedoic Acid-Ezetimibe (NEXLIZET) 180-10 MG TABS Take 1 tablet by mouth daily in the afternoon. 90 tablet 3   benazepril (LOTENSIN) 20 MG tablet Take 1 tablet (20 mg total) by mouth daily. 90 tablet 3   CALCIUM PO Take 1 tablet by mouth daily.     DULoxetine (CYMBALTA) 30 MG capsule Take 30 mg by mouth daily. (Patient not taking: Reported on 07/17/2023)     DULoxetine (CYMBALTA) 60 MG capsule Take 60 mg by mouth daily.     escitalopram (LEXAPRO) 20 MG tablet Take 20 mg by mouth daily.     folic  acid (FOLVITE) 1 MG tablet TAKE ONE TABLET BY MOUTH ONCE DAILY 90 tablet 3   furosemide (LASIX) 40 MG tablet Take 1 tablet (40 mg total) by mouth daily. 90 tablet 2   metFORMIN (GLUCOPHAGE-XR) 500 MG 24 hr tablet Take 1,000 mg by mouth 2 (two) times daily.     metoprolol succinate (TOPROL-XL) 100 MG 24 hr tablet Take 1 tablet (100 mg total) by mouth daily. Take with or immediately following a meal. 90 tablet 3   NON FORMULARY Take 700 mg by mouth daily. Apple pectin      omeprazole (PRILOSEC) 20 MG capsule Take 20 mg by mouth daily.     potassium chloride SA (KLOR-CON M) 20 MEQ tablet Take 2 tablets (40 mEq total) by mouth 3 (three) times daily. 180 tablet 5   predniSONE (DELTASONE) 5 MG tablet Take 1 tablet (5 mg total) by mouth daily with breakfast. 90 tablet 1   traZODone (DESYREL) 50 MG tablet Take 50 mg by mouth at bedtime as needed. (Patient not taking: Reported on 07/17/2023)     vitamin B-12 (CYANOCOBALAMIN) 1000 MCG tablet Take 1,000 mcg by mouth daily.     vitamin k 100 MCG tablet Take 100 mcg by mouth daily.     No current facility-administered medications for this visit.     ASSESSMENT & PLAN:   Assessment & Plan: Mike [name] is a 52 y.o. male with  1.  Chronic ITP,  which remains stable on prednisone 5 mg daily.  2.  Steroid induced diabetes, for which he continues metformin 500 mg twice daily.  He was able to get off of glipizide with weight management.      3.  Elevated Epstein Barr titers.  This may be indicative of chronic fatigue syndrome, and I advised that he rest as needed.   4.  Sleep apnea.  He was referred to pulmonary.   5.  Chronic anxiety, mainly related to his health, and also to his job and insurance issues.  He is currently on Cymbalta 60 mg daily.  He continues to see our LCSW for counseling.  6.  Hospital admission with acute kidney injury due to dehydration associated with COVID-19, May 2022.   He was placed on an extremely slow prednisone taper but we were unable to completely taper him off.     7.  Significant left hip and lower back pain due to osteoarthritis and degenerative disc disease.  He is following with a spine specialist.  He is undergoing physical therapy. MRI of the lumbar spine revealed degenerative changes of L4-5. His surgeon does not feel comfortable doing any invasive procedure or surgery with his persistent thrombocytopenia.   8.  Elevated bilirubin which has persisted but remains stable. I suspect possible Gilbert's syndrome as his sister has the same trait but we will hold off on ordering [id number] as it would not change our plan of care.   9. Episodes of pre-syncope which are unexplained and could be cardiac or medication or hypoglycemia. In view of the possibility of a petit mal seizure or CNS hemorrhage, a brain MRI was done. It was then repeated with contrast. His MRI of the head in April revealed abnormal size and shape of the ventricles with asymmetric enlargement of the left frontal horn. We don't know if this is congenital or whether it represents hydrocephalus.  A repeat scan was suggested in 6 months, in October to see if there is any difference.  He was seen by neurosurgery,  who planned repeat MRI head as  recommended. He referred the patient to a neurologist, Dr Teresa Coombs to have an EEG done. He was prescribed Keppra for possible seizures but did not start that.  He is scheduled to have the EEG done soon.  He denies continued episodes of presyncope.    We will plan to see him back in 3 months with a CBC and comprehensive metabolic panel.  The patient understands the plans discussed today and is in agreement with them.  He knows to contact our office if he develops concerns prior to his next appointment.     I provided 30 minutes of face-to-face time during this encounter and > 50% was spent counseling as documented under my assessment and plan.    Adah Perl, PA-C  [facility] OF [facility] [address] [place] [address] Dept: [phone] Dept Fax: [phone]   No orders of the defined types were placed in this encounter.

## 2023-07-17 ENCOUNTER — Telehealth: Payer: Self-pay | Admitting: Hematology and Oncology

## 2023-07-17 ENCOUNTER — Inpatient Hospital Stay: Payer: Medicare Other

## 2023-07-17 ENCOUNTER — Encounter: Payer: Self-pay | Admitting: Hematology and Oncology

## 2023-07-17 ENCOUNTER — Inpatient Hospital Stay (HOSPITAL_BASED_OUTPATIENT_CLINIC_OR_DEPARTMENT_OTHER): Payer: Medicare Other | Admitting: Hematology and Oncology

## 2023-07-17 VITALS — BP 109/68 | HR 54 | Temp 97.7°F | Resp 20 | Ht 71.0 in | Wt 196.5 lb

## 2023-07-17 DIAGNOSIS — Z803 Family history of malignant neoplasm of breast: Secondary | ICD-10-CM | POA: Diagnosis not present

## 2023-07-17 DIAGNOSIS — F419 Anxiety disorder, unspecified: Secondary | ICD-10-CM | POA: Diagnosis not present

## 2023-07-17 DIAGNOSIS — R55 Syncope and collapse: Secondary | ICD-10-CM | POA: Diagnosis not present

## 2023-07-17 DIAGNOSIS — Z79899 Other long term (current) drug therapy: Secondary | ICD-10-CM | POA: Diagnosis not present

## 2023-07-17 DIAGNOSIS — E099 Drug or chemical induced diabetes mellitus without complications: Secondary | ICD-10-CM | POA: Diagnosis not present

## 2023-07-17 DIAGNOSIS — Z7952 Long term (current) use of systemic steroids: Secondary | ICD-10-CM | POA: Diagnosis not present

## 2023-07-17 DIAGNOSIS — D693 Immune thrombocytopenic purpura: Secondary | ICD-10-CM | POA: Diagnosis not present

## 2023-07-17 DIAGNOSIS — Z7984 Long term (current) use of oral hypoglycemic drugs: Secondary | ICD-10-CM | POA: Diagnosis not present

## 2023-07-17 DIAGNOSIS — G4730 Sleep apnea, unspecified: Secondary | ICD-10-CM | POA: Diagnosis not present

## 2023-07-17 LAB — CBC WITH DIFFERENTIAL (CANCER CENTER ONLY)
Abs Immature Granulocytes: 0.03 10*3/uL (ref 0.00–0.07)
Basophils Absolute: 0.1 10*3/uL (ref 0.0–0.1)
Basophils Relative: 1 %
Eosinophils Absolute: 0.3 10*3/uL (ref 0.0–0.5)
Eosinophils Relative: 4 %
HCT: 45.2 % (ref 39.0–52.0)
Hemoglobin: 15.9 g/dL (ref 13.0–17.0)
Immature Granulocytes: 0 %
Immature Platelet Fraction: 2.6 % (ref 1.2–8.6)
Lymphocytes Relative: 26 %
Lymphs Abs: 2.1 10*3/uL (ref 0.7–4.0)
MCH: 30.4 pg (ref 26.0–34.0)
MCHC: 35.2 g/dL (ref 30.0–36.0)
MCV: 86.4 fL (ref 80.0–100.0)
Monocytes Absolute: 0.5 10*3/uL (ref 0.1–1.0)
Monocytes Relative: 6 %
Neutro Abs: 5.3 10*3/uL (ref 1.7–7.7)
Neutrophils Relative %: 63 %
Platelet Count: 108 10*3/uL — ABNORMAL LOW (ref 150–400)
RBC: 5.23 MIL/uL (ref 4.22–5.81)
RDW: 12.9 % (ref 11.5–15.5)
WBC Count: 8.4 10*3/uL (ref 4.0–10.5)
nRBC: 0 % (ref 0.0–0.2)
nRBC: 0 /100{WBCs}

## 2023-07-17 LAB — CMP (CANCER CENTER ONLY)
ALT: 30 U/L (ref 0–44)
AST: 27 U/L (ref 15–41)
Albumin: 5 g/dL (ref 3.5–5.0)
Alkaline Phosphatase: 78 U/L (ref 38–126)
Anion gap: 11 (ref 5–15)
BUN: 28 mg/dL — ABNORMAL HIGH (ref 6–20)
CO2: 29 mmol/L (ref 22–32)
Calcium: 10.1 mg/dL (ref 8.9–10.3)
Chloride: 99 mmol/L (ref 98–111)
Creatinine: 1.04 mg/dL (ref 0.61–1.24)
GFR, Estimated: >60 mL/min (ref >60)
Glucose, Bld: 131 mg/dL — ABNORMAL HIGH (ref 70–99)
Potassium: 4.2 mmol/L (ref 3.5–5.1)
Sodium: 138 mmol/L (ref 135–145)
Total Bilirubin: 1 mg/dL (ref <1.2)
Total Protein: 7.8 g/dL (ref 6.5–8.1)

## 2023-07-17 NOTE — Telephone Encounter (Signed)
07/17/23 Next appt scheduled and confirmed with patient.

## 2023-07-21 DIAGNOSIS — H524 Presbyopia: Secondary | ICD-10-CM | POA: Diagnosis not present

## 2023-07-27 ENCOUNTER — Ambulatory Visit (INDEPENDENT_AMBULATORY_CARE_PROVIDER_SITE_OTHER): Payer: Medicare Other | Admitting: Neurology

## 2023-07-27 DIAGNOSIS — R569 Unspecified convulsions: Secondary | ICD-10-CM

## 2023-07-27 NOTE — Procedures (Signed)
    History:  52 year old man with seizure like activity   EEG classification: Awake and drowsy  Duration: 26 minutes   Technical aspects: This EEG study was done with scalp electrodes positioned according to the 10-20 International system of electrode placement. Electrical activity was reviewed with band pass filter of 1-70Hz , sensitivity of 7 uV/mm, display speed of 41mm/sec with a 60Hz  notched filter applied as appropriate. EEG data were recorded continuously and digitally stored.   Description of the recording: The background rhythms of this recording consists of a fairly well modulated medium amplitude beta  rhythm that is reactive to eye opening and closure. Present in the anterior head region is a 15-20 Hz beta activity. Photic stimulation was performed, did not show any abnormalities. Hyperventilation was also performed, did not show any abnormalities. Drowsiness was manifested by background fragmentation. No abnormal epileptiform discharges seen during this recording. There was no focal slowing. There were no electrographic seizure identified.   Abnormality: None   Impression: This is a normal EEG recorded while drowsy and awake. No evidence of interictal epileptiform discharges. The excess beta activity is likely a side effect of medication. Normal EEGs, however, do not rule out epilepsy.    Windell Norfolk, MD [facility]

## 2023-08-04 ENCOUNTER — Other Ambulatory Visit: Payer: Self-pay | Admitting: Oncology

## 2023-08-04 DIAGNOSIS — D529 Folate deficiency anemia, unspecified: Secondary | ICD-10-CM

## 2023-08-05 ENCOUNTER — Inpatient Hospital Stay: Payer: Medicare Other | Attending: Hematology and Oncology

## 2023-08-05 NOTE — Progress Notes (Signed)
[facility] CSW Counseling Note  Patient was referred by self. Treatment type: Individual  Presenting Concerns: Patient and/or family reports the following symptoms/concerns: anxiety and stress Duration of problem: 3 Years Severity of problem: mild   Orientation:oriented to person, place, and time/date.   Affect: Congruent Risk of harm to self or others: No plan to harm self or others  Patient and/or Family's Strengths/Protective Factors: Social and Emotional competence, Concrete supports in place (healthy food, safe environments, etc.), and Sense of purposeAbility for insight  Active sense of humor  Average or above average intelligence  Capable of independent living  Arboriculturist fund of knowledge  Motivation for treatment/growth  Physical Health  Special hobby/interest  Supportive family/friends      Goals Addressed: Patient will:  Reduce symptoms of: anxiety and stress Increase knowledge and/or ability of: coping skills, self-management skills, and stress reduction  Increase healthy adjustment to current life circumstances   Progress towards Goals: Progressing   Interventions: Interventions utilized:  CBT, Solution Focused, Strength-based, and Reframing      Assessment: Patient currently experiencing continued anxiety and stress related to ongoing disability concerns and post treatment concerns. Patient implements stress management techniques in the past three weeks and continues to show improvement.      Plan: Follow up with CSW: January 8th    [place], [place]

## 2023-08-24 ENCOUNTER — Other Ambulatory Visit (HOSPITAL_COMMUNITY): Payer: Self-pay

## 2023-09-03 ENCOUNTER — Other Ambulatory Visit (HOSPITAL_COMMUNITY): Payer: Self-pay

## 2023-09-03 ENCOUNTER — Telehealth: Payer: Self-pay | Admitting: Cardiology

## 2023-09-03 ENCOUNTER — Telehealth: Payer: Self-pay | Admitting: Pharmacy Technician

## 2023-09-03 DIAGNOSIS — E782 Mixed hyperlipidemia: Secondary | ICD-10-CM

## 2023-09-03 DIAGNOSIS — I2510 Atherosclerotic heart disease of native coronary artery without angina pectoris: Secondary | ICD-10-CM

## 2023-09-03 MED ORDER — NEXLIZET 180-10 MG PO TABS: 1.0000 | ORAL_TABLET | Freq: Every day | ORAL | Status: DC

## 2023-09-03 NOTE — Telephone Encounter (Signed)
Samples given.  

## 2023-09-03 NOTE — Telephone Encounter (Signed)
Tier exception submitted

## 2023-09-03 NOTE — Telephone Encounter (Signed)
 Sister [name]) wants orders for lab work sent to patient's Hematologist (Dr. Gilman Buttner) prior to visit on 2/14.

## 2023-09-03 NOTE — Telephone Encounter (Signed)
 Patient calling the office for samples of medication:   1.  What medication and dosage are you requesting samples for?  Bempedoic Acid -Ezetimibe  (NEXLIZET ) 180-10 MG TABS   2.  Are you currently out of this medication?   Sister Sherrye) stated patient can't afford this medication and they want to get samples of this medication.  Sister stated they also want to get an exemption sent to patient's insurance so patient can get this medication.

## 2023-09-03 NOTE — Telephone Encounter (Signed)
 Labs ordered.

## 2023-09-04 ENCOUNTER — Other Ambulatory Visit: Payer: Self-pay | Admitting: Oncology

## 2023-09-04 DIAGNOSIS — E876 Hypokalemia: Secondary | ICD-10-CM

## 2023-09-04 NOTE — Telephone Encounter (Signed)
 Spoke with [name] per DPR and advised that tier exception has been approved and labs ordered. [name] verbalized understanding and had no additional questions.

## 2023-09-04 NOTE — Telephone Encounter (Signed)
 Received phone call from [facility] and the patient has been approved for Nexlizet  Approval for Nexlizet 09/03/23 - 09/02/24 Case #[id number]

## 2023-09-09 ENCOUNTER — Other Ambulatory Visit: Payer: Medicare Other

## 2023-09-10 ENCOUNTER — Inpatient Hospital Stay: Payer: Medicare Other | Attending: Hematology and Oncology

## 2023-09-10 NOTE — Progress Notes (Signed)
 [facility] CSW Counseling Note  Patient was referred by self. Treatment type: Individual  Presenting Concerns: Patient and/or family reports the following symptoms/concerns: anxiety and stress Duration of problem: 3 Years Severity of problem: mild   Orientation:oriented to person, place, and time/date.   Affect: Congruent Risk of harm to self or others: No plan to harm self or others  Patient and/or Family's Strengths/Protective Factors: Social and Emotional competence, Concrete supports in place (healthy food, safe environments, etc.), and Sense of purposeAbility for insight  Active sense of humor  Average or above average intelligence  Capable of independent living  Arboriculturist fund of knowledge  Motivation for treatment/growth  Physical Health  Special hobby/interest  Supportive family/friends      Goals Addressed: Patient will:  Reduce symptoms of: anxiety and stress Increase knowledge and/or ability of: coping skills, self-management skills, and stress reduction  Increase healthy adjustment to current life circumstances   Progress towards Goals: Progressing   Interventions: Interventions utilized:  CBT, Solution Focused, Strength-based, and Reframing      Assessment: Patient has had success with management of stressful events over the course of three weeks.Patient explored positive successes over the past three weeks and how it has impacted him. Patient discussed history of anxiety medications and desire to not be on medication. Patient       Plan: Follow up with CSW: January 30th    Jonel Sick, LCSWA

## 2023-10-01 ENCOUNTER — Inpatient Hospital Stay: Payer: Medicare Other

## 2023-10-01 NOTE — Progress Notes (Signed)
[facility] CSW Counseling Note  Patient was referred by self. Treatment type: Individual  Presenting Concerns: Patient and/or family reports the following symptoms/concerns: anxiety and stress Duration of problem: 3 Years Severity of problem: mild   Orientation:oriented to person, place, and time/date.   Affect: Congruent Risk of harm to self or others: No plan to harm self or others  Patient and/or Family's Strengths/Protective Factors: Social and Emotional competence, Concrete supports in place (healthy food, safe environments, etc.), and Sense of purposeAbility for insight  Active sense of humor  Average or above average intelligence  Capable of independent living  Arboriculturist fund of knowledge  Motivation for treatment/growth  Physical Health  Special hobby/interest  Supportive family/friends      Goals Addressed: Patient will:  Reduce symptoms of: anxiety and stress Increase knowledge and/or ability of: coping skills, self-management skills, and stress reduction  Increase healthy adjustment to current life circumstances   Progress towards Goals: Progressing   Interventions: Interventions utilized:  CBT, Solution Focused, Strength-based, and Reframing      Assessment: Patient has had success with management of stressful events over the course of three weeks.Patient explored stressful situations over the past two weeks, and how he has handled the situations. Patient recanted his history with oncology treatment.     Plan: Follow up with CSW: February 20th    Marguerita Merles, [place]

## 2023-10-15 ENCOUNTER — Encounter: Payer: Self-pay | Admitting: *Deleted

## 2023-10-15 ENCOUNTER — Other Ambulatory Visit: Payer: Self-pay

## 2023-10-15 DIAGNOSIS — Q048 Other specified congenital malformations of brain: Secondary | ICD-10-CM

## 2023-10-15 DIAGNOSIS — D696 Thrombocytopenia, unspecified: Secondary | ICD-10-CM

## 2023-10-15 DIAGNOSIS — G40209 Localization-related (focal) (partial) symptomatic epilepsy and epileptic syndromes with complex partial seizures, not intractable, without status epilepticus: Secondary | ICD-10-CM | POA: Insufficient documentation

## 2023-10-15 DIAGNOSIS — E1169 Type 2 diabetes mellitus with other specified complication: Secondary | ICD-10-CM

## 2023-10-15 DIAGNOSIS — E559 Vitamin D deficiency, unspecified: Secondary | ICD-10-CM

## 2023-10-15 DIAGNOSIS — I119 Hypertensive heart disease without heart failure: Secondary | ICD-10-CM

## 2023-10-15 HISTORY — DX: Localization-related (focal) (partial) symptomatic epilepsy and epileptic syndromes with complex partial seizures, not intractable, without status epilepticus: G40.209

## 2023-10-15 HISTORY — DX: Other specified congenital malformations of brain: Q04.8

## 2023-10-15 NOTE — Progress Notes (Unsigned)
Cardiology Office Note:  .   Date:  10/16/2023  ID:  Mike [name], DOB October 29, 1970, MRN [id number] PCP: Julianne Handler, NP  [facility] Providers Cardiologist:  Garwin Brothers, MD    History of Present Illness: Mike [name] Kitchen   Mike [name] is a 53 y.o. male with a past medical history of nonobstructive CAD coronary CTA in 2022, hypertension, OSA, GERD, thrombocytopenia, dyslipidemia with statin intolerance.   03/14/2021 coronary CTA calcium score of 296, 96 percentile, minimal plaque in the LM and LAD 03/12/2021 echo EF 55 to 60%, mild concentric LVH  Most recently evaluated by Dr. Tomie China on 12/10/2022, he was stable from a cardiac perspective and advised to follow-up in 6 months.  He presents today for follow-up of his CAD.  He is stable from a cardiac perspective, offers no formal complaint.  He does participate in formal exercise, runs.  There are some concerns that his Nexlizet is causing shoulder pain and it is also cost prohibitive. He denies chest pain, palpitations, dyspnea, pnd, orthopnea, n, v, dizziness, syncope, edema, weight gain, or early satiety.    ROS: Review of Systems  Musculoskeletal:  Positive for joint pain.  All other systems reviewed and are negative.    Studies Reviewed: .        Cardiac Studies & Procedures   ______________________________________________________________________________________________     ECHOCARDIOGRAM  ECHOCARDIOGRAM COMPLETE 03/12/2021  Narrative ECHOCARDIOGRAM REPORT    Patient Name:   Mike [name] Date of Exam: 03/12/2021 Medical Rec #:  [id number]        Height:       71.0 in Accession #:    [id number]       Weight:       270.0 lb Date of Birth:  02-13-1971        BSA:          2.395 m Patient Age:    50 years         BP:           114/68 mmHg Patient Gender: M                HR:           73 bpm. Exam Location:    Procedure: 2D Echo  Indications:    Dyspnea R06.00  History:        Patient has no prior  history of Echocardiogram examinations. Risk Factors:Hypertension.  Sonographer:    Louie Boston Referring Phys: Rito Ehrlich [facility]  IMPRESSIONS   1. Left ventricular ejection fraction, by estimation, is 55 to 60%. The left ventricle has normal function. The left ventricle has no regional wall motion abnormalities. There is mild concentric left ventricular hypertrophy. Left ventricular diastolic parameters were normal. 2. Right ventricular systolic function is normal. The right ventricular size is normal. There is normal pulmonary artery systolic pressure. 3. The mitral valve is normal in structure. No evidence of mitral valve regurgitation. No evidence of mitral stenosis. 4. The aortic valve has an indeterminant number of cusps. Aortic valve regurgitation is not visualized. No aortic stenosis is present. 5. The inferior vena cava is normal in size with greater than 50% respiratory variability, suggesting right atrial pressure of 3 mmHg.  FINDINGS Left Ventricle: Left ventricular ejection fraction, by estimation, is 55 to 60%. The left ventricle has normal function. The left ventricle has no regional wall motion abnormalities. The left ventricular internal cavity size was normal in size. There is mild concentric left  ventricular hypertrophy. Left ventricular diastolic parameters were normal. Normal left ventricular filling pressure.  Right Ventricle: The right ventricular size is normal. No increase in right ventricular wall thickness. Right ventricular systolic function is normal. There is normal pulmonary artery systolic pressure. The tricuspid regurgitant velocity is 2.23 m/s, and with an assumed right atrial pressure of 3 mmHg, the estimated right ventricular systolic pressure is 22.9 mmHg.  Left Atrium: Left atrial size was normal in size.  Right Atrium: Right atrial size was normal in size.  Pericardium: There is no evidence of pericardial effusion.  Mitral Valve: The mitral valve  is normal in structure. No evidence of mitral valve regurgitation. No evidence of mitral valve stenosis.  Tricuspid Valve: The tricuspid valve is normal in structure. Tricuspid valve regurgitation is trivial. No evidence of tricuspid stenosis.  Aortic Valve: The aortic valve has an indeterminant number of cusps. Aortic valve regurgitation is not visualized. No aortic stenosis is present.  Pulmonic Valve: The pulmonic valve was normal in structure. Pulmonic valve regurgitation is not visualized. No evidence of pulmonic stenosis.  Aorta: The aortic root and ascending aorta are structurally normal, with no evidence of dilitation and the aortic arch was not well visualized.  Venous: A normal flow pattern is recorded from the right upper pulmonary vein. The inferior vena cava is normal in size with greater than 50% respiratory variability, suggesting right atrial pressure of 3 mmHg.  IAS/Shunts: No atrial level shunt detected by color flow Doppler.   LEFT VENTRICLE PLAX 2D LVIDd:         5.70 cm  Diastology LVIDs:         3.70 cm  LV e' medial:    7.18 cm/s LV PW:         1.10 cm  LV E/e' medial:  16.7 LV IVS:        1.10 cm  LV e' lateral:   7.72 cm/s LVOT diam:     2.00 cm  LV E/e' lateral: 15.5 LV SV:         57 LV SV Index:   24 LVOT Area:     3.14 cm   RIGHT VENTRICLE             IVC RV S prime:     17.20 cm/s  IVC diam: 1.60 cm TAPSE (M-mode): 2.6 cm  LEFT ATRIUM             Index       RIGHT ATRIUM           Index LA diam:        4.20 cm 1.75 cm/m  RA Area:     15.90 cm LA Vol (A2C):   46.8 ml 19.54 ml/m RA Volume:   43.10 ml  17.99 ml/m LA Vol (A4C):   56.0 ml 23.38 ml/m LA Biplane Vol: 52.7 ml 22.00 ml/m AORTIC VALVE LVOT Vmax:   91.70 cm/s LVOT Vmean:  59.900 cm/s LVOT VTI:    0.180 m  AORTA Ao Root diam: 2.80 cm Ao Asc diam:  2.80 cm Ao Desc diam: 1.80 cm  MITRAL VALVE                TRICUSPID VALVE MV Area (PHT): 3.65 cm     TR Peak grad:   19.9 mmHg MV  Decel Time: 208 msec     TR Vmax:        223.00 cm/s MV E velocity: 120.00 cm/s MV A velocity: 92.00 cm/s  SHUNTS MV E/A ratio:  1.30         Systemic VTI:  0.18 m Systemic Diam: 2.00 cm  Norman Herrlich MD Electronically signed by Norman Herrlich MD Signature Date/Time: 03/12/2021/4:31:55 PM    Final      CT SCANS  CT CORONARY MORPH W/CTA COR W/SCORE 03/14/2021  Addendum 03/14/2021  1:54 PM ADDENDUM REPORT: 03/14/2021 13:52  CLINICAL DATA:  53M with hypertension and OSA.  EXAM: Cardiac/Coronary  CT  TECHNIQUE: The patient was scanned on a [employer].  FINDINGS: A 120 kV prospective scan was triggered in the descending thoracic aorta at 111 HU's. Axial non-contrast 3 mm slices were carried out through the heart. The data set was analyzed on a dedicated work station and scored using the Agatson method. Gantry rotation speed was 250 msecs and collimation was .6 mm. No beta blockade and 0.8 mg of sl NTG was given. The 3D data set was reconstructed in 5% intervals of the 67-82 % of the R-R cycle. Diastolic phases were analyzed on a dedicated work station using MPR, MIP and VRT modes. The patient received 80 cc of contrast.  Aorta: Normal size.  2.6 cm.  Minimal calcification.  No dissection.  Aortic Valve:  Trileaflet.  No calcifications.  Coronary Arteries:  Normal coronary origin.  Right dominance.  RCA is a large dominant artery that gives rise to PDA and PLVB. There is minimal (<25%) calcified plaque proximally and mild (25-49%) calcified plaque in the mid RCA. There is minimal (<25%) calcification of the PDA.  Left main is a large artery that gives rise to LAD and LCX arteries. There is minimal (<25%) calcified plaque.  LAD is a large vessel that has minimal (<25%) calcified plaque in the mid LAD.  LCX is a non-dominant artery that gives rise to two OM branches. There is no plaque.  Coronary Calcium Score:  Left main: 61.7  Left anterior  descending artery: 22.8  Left circumflex artery: 7.86  Right coronary artery: 197  Total: 296  Percentile: 96th  Other findings:  Normal pulmonary vein drainage into the left atrium.  Normal let atrial appendage without a thrombus.  Normal size of the pulmonary artery.  IMPRESSION: 1. Coronary calcium score of 296. This was 96th percentile for age-, race-, and sex-matched controls.  2. Normal coronary origin with right dominance.  3. Minimal plaque in LM and LAD. Mild (25-49%) RCA plaque. CAD-RADS 2.  4. Recommend aggressive risk factor modification, including LDL goal <70.  Chilton Si, MD   Electronically Signed By: Chilton Si On: 03/14/2021 13:52  Narrative EXAM: OVER-READ INTERPRETATION  CT CHEST  The following report is an over-read performed by radiologist Dr. Trudie Reed of [facility] on 03/14/2021. This over-read does not include interpretation of cardiac or coronary anatomy or pathology. The coronary calcium score/coronary CTA interpretation by the cardiologist is attached.  COMPARISON:  No priors.  FINDINGS: Mild linear scarring in the left lower lobe. Atherosclerotic calcifications in the thoracic aorta. Within the visualized portions of the thorax there are no suspicious appearing pulmonary nodules or masses, there is no acute consolidative airspace disease, no pleural effusions, no pneumothorax and no lymphadenopathy. Visualized portions of the upper abdomen are unremarkable. There are no aggressive appearing lytic or blastic lesions noted in the visualized portions of the skeleton.  IMPRESSION: 1.  Aortic Atherosclerosis (ICD10-I70.0).  Electronically Signed: By: Trudie Reed M.D. On: 03/14/2021 09:43     ______________________________________________________________________________________________      Risk  Assessment/Calculations:             Physical Exam:   VS:  BP 100/60 (BP Location: Left  Arm, Patient Position: Sitting, Cuff Size: Normal)   Pulse (!) 54   Ht 5\' 11"  (1.803 m)   Wt 190 lb (86.2 kg)   SpO2 98%   BMI 26.50 kg/m    Wt Readings from Last 3 Encounters:  10/16/23 190 lb (86.2 kg)  10/16/23 191 lb 6.4 oz (86.8 kg)  07/17/23 196 lb 8 oz (89.1 kg)    GEN: Well nourished, well developed in no acute distress NECK: No JVD; No carotid bruits CARDIAC: RRR, no murmurs, rubs, gallops RESPIRATORY:  Clear to auscultation without rales, wheezing or rhonchi  ABDOMEN: Soft, non-tender, non-distended EXTREMITIES:  No edema; No deformity   ASSESSMENT AND PLAN: .   CAD-nonobstructive per coronary CTA. Stable with no anginal symptoms. No indication for ischemic evaluation.  He exercises regularly without any symptoms.  He has ITP therefore is not on aspirin, nor statins.  Currently he is on Nexlizet 180-10 mg daily. Heart healthy diet and regular cardiovascular exercise encouraged.    Dyslipidemia with statin intolerance- most recent LDL was well-controlled at 46.  He is statin intolerant secondary to his ITP.  Continue Nexlizet 180-10 mg daily.  He has been bothered by shoulder pains and he questions whether this could be related to his medication.  Initially we discussed taking a holiday from his Nexlizet, however I am going to refer him to our lipid specialist as this medication is cost prohibitive for him and only apparently been providing him with samples however we do not have a need to provide today.  Possibly be a candidate for PCSK9 inhibitor.  Will refer to lipid clinic.  Hypertension -pressures well-controlled at 100/60, continue benazepril 20 mg daily, continue Norvasc 10 mg daily.  Idiopathic thrombocytopenia-follows with Dr. Gilman Buttner at the cancer center.       Dispo: Refer to Lipid Clinic. Follow up 6 months with Dr. Tomie China.   Signed, Flossie Dibble, NP

## 2023-10-15 NOTE — Progress Notes (Signed)
[facility]  [address] [place],  [place]  [address] [phone]  Clinic Day:  10/16/23  Referring physician: Julianne Handler, NP   CHIEF COMPLAINT:  CC: Chronic ITP  Current Treatment: Prednisone 5 mg daily  HISTORY OF PRESENT ILLNESS:  Mike [name] is a 53 y.o. male with a history of severe thrombocytopenia consistent with immune thrombocytopenic purpura diagnosed in May 2021.  Further laboratory evaluation did not reveal any other specific etiology for his thrombocytopenia.  He had mildly elevated liver transaminases.  Abdominal ultrasound revealed increased echogenicity of hepatic parenchyma suggesting hepatic steatosis, with probable fatty sparing adjacent to gallbladder fossa.  No other abnormality was seen.  We recommended bone marrow examination, but the patient and his mother declined.  The platelet count continued to fluctuate up and down, but as his platelet count remained 25,000 in June 2021, he was started on prednisone 20 mg 3 times daily, with a good response.  He has had leukocytosis secondary to the high-dose prednisone.  He also has had oral candidiasis on occasion. Malachi Carl titers were elevated, so this may be representative of chronic fatigue syndrome. We were unable to taper him off the prednisone, so he was treated with rituximab weekly for 4 weeks in October and November 2021.  We slowly tapered the prednisone after that.  He had complications of significant weight gain, hypertension, steroid induced diabetes and steroid induced myopathy. He has also had hypokalemia resolved with oral supplementation.     On May 18th, 2022 his platelet count was 151,000, so prednisone was decreased to 5 mg daily. He was admitted with COVID-19 on May 24th after leaving work earlier due to altered mental status and generalized weakness. He had acute kidney injury with a creatinine of 2.2, which normalized with IV hydration. He had fever, but no respiratory symptoms. He  was discharged on a prolonged prednisone taper over 15 weeks.  He had been having some chest pain and dyspnea since discontinuing indapamide, so was referred to Dr. Tomie China in Cardiology. He placed him on furosemide 40 mg daily and metoprolol 50 mg daily.  He had had problems with hypokalemia for a long time, but this worsened with the diuretic, so potassium was increased to 40 mg twice daily.  The lower extremity edema has resolved with the furosemide.  ECHO revealed left ventricular hypertrophy with an EF between 55-60%, and the CT of the coronary arteries looks good.  He was placed on gabapentin 300 mg BID for neuropathy.  He was on a extremely slow taper of the prednisone, but his platelets dropped to 73,000 when the dose was decreased to 5 mg three times per wee, so we resumed 5 mg daily.  Gabapentin was discontinued as it wasn't effective.  MRI lumbar spine in October 2022 revealed degenerative changes of the lumbar spine most prominent L4-5.  He was recently referred to spinal specialist.  He was hesitant to undergo epidural injection due to thrombocytopenia, although his platelets were usually above 75,000.  He has remained on prednisone 5 mg daily and his platelet count has remained above 75,000 since October 2022.  We began to space out his visits over time.  He has had mild elevation of the bilirubin, felt to be most likely Gilbert's syndrome.  Testing was not done. He has been able to lose weight over the years.  He has struggled with chronic anxiety and was previously seen by psychiatry.  He began seeing our licensed clinical social worker  in August 2023 for counseling.   He reported presyncopal episodes so underwent MRI head in March 2024.  He initially declined contrast, but there were some abnormalities, so he underwent MRI head with contrast in April which revealed stable size and shape of the ventricular system from March with asymmetric enlargement of the left frontal horn. No  contrast-enhancing lesion is visualized to suggest an obstructive cause for ventricular configuration. There were few vessels that course along the region of the foramen of Monro. Overall, this was a nonspecific finding and may be congenital. Follow up brain MRI with a CISS sequence is recommended in 6 months to assess for stability and exclude an underlying lesion. Additionally, earlier follow up was recommended if there is an acute change in patient's symptoms (worsening headaches or new neurologic symptoms) to exclude the possibility of hydrocephalus.  We referred him to neurosurgery who subsequently referred him to neurology.  INTERVAL HISTORY:  Mike [name] is here today for repeat clinical assessment for his chronic ITP.  He denies abnormal bleeding or bruising. Patient states that he feels ok but complains of severe chronic lower back pain and an intermittent aching/sharp pain in his bilateral shoulder area. He thinks that this could be due to a side effect of Nexlizet 180-10mg , as this new pain did not start until around the time he began this. He previously used to go to a orthopedic surgeon but has stopped going for some time. He has a WBC of 7.1, hemoglobin of 15.5, and a low platelet count 104,000, down from 108,000. His CMP is normal other than an elevated BUN of 30 up from 28. His lipid, hemoglobin A1c, and TSH levels today are pending. I instructed him to increase his fluids. Patient continues Prednisone 5mg  without difficulty but has not had a bone density. I explained the importance of getting one since he is prone to developing osteopenia or osteoporosis while on Prednisone. I will schedule a bone density scan for him today and will call him with the results. I will see him back in 3 months with CBC and CMP.  He denies signs of infection such as sore throat, sinus drainage, cough, or urinary symptoms.  He denies fevers or recurrent chills. He denies pain. He denies nausea, vomiting, chest pain,  dyspnea or cough. His appetite is good and his weight has decreased 5 pounds over last 3 months .  REVIEW OF SYSTEMS:  Review of Systems  Constitutional:  Negative for appetite change, chills, diaphoresis, fatigue, fever and unexpected weight change.  HENT:  Negative.  Negative for hearing loss, lump/mass, mouth sores, nosebleeds, sore throat, tinnitus, trouble swallowing and voice change.   Eyes: Negative.  Negative for eye problems and icterus.  Respiratory: Negative.  Negative for chest tightness, cough, hemoptysis, shortness of breath and wheezing.   Cardiovascular: Negative.  Negative for chest pain, leg swelling and palpitations.  Gastrointestinal: Negative.  Negative for abdominal distention, abdominal pain, blood in stool, constipation, diarrhea, nausea, rectal pain and vomiting.  Endocrine: Negative.  Negative for hot flashes.  Genitourinary: Negative.  Negative for bladder incontinence, difficulty urinating, dyspareunia, dysuria, frequency, hematuria, nocturia, pelvic pain and penile discharge.   Musculoskeletal:  Positive for back pain (Lower back) and gait problem (difficulty ambulating). Negative for arthralgias, flank pain, myalgias, neck pain and neck stiffness.       Aching/sharp intermittent bilateral shoulder pain  Skin: Negative.  Negative for itching, rash and wound.  Neurological:  Positive for extremity weakness, gait problem (difficulty ambulating) and numbness (  Bilateral hands). Negative for dizziness, headaches, light-headedness, seizures and speech difficulty.  Hematological: Negative.  Negative for adenopathy. Does not bruise/bleed easily.  Psychiatric/Behavioral:  Positive for sleep disturbance. Negative for confusion, decreased concentration, depression and suicidal ideas. The patient is not nervous/anxious.      VITALS:  Blood pressure 114/68, pulse (!) 57, temperature 98.3 F (36.8 C), temperature source Oral, resp. rate 16, height 5\' 11"  (1.803 m), weight 191 lb  6.4 oz (86.8 kg), SpO2 100%.  Wt Readings from Last 3 Encounters:  10/16/23 190 lb (86.2 kg)  10/16/23 191 lb 6.4 oz (86.8 kg)  07/17/23 196 lb 8 oz (89.1 kg)    Body mass index is 26.69 kg/m.  Performance status (ECOG): 1 - Symptomatic but completely ambulatory  PHYSICAL EXAM:  Physical Exam Vitals and nursing note reviewed.  Constitutional:      General: He is not in acute distress.    Appearance: Normal appearance. He is normal weight. He is not ill-appearing, toxic-appearing or diaphoretic.  HENT:     Head: Normocephalic and atraumatic.     Right Ear: Tympanic membrane, ear canal and external ear normal. There is no impacted cerumen.     Left Ear: Tympanic membrane, ear canal and external ear normal. There is no impacted cerumen.     Nose: Nose normal. No congestion or rhinorrhea.     Mouth/Throat:     Mouth: Mucous membranes are moist.     Pharynx: Oropharynx is clear. No oropharyngeal exudate or posterior oropharyngeal erythema.  Eyes:     General: No scleral icterus.       Right eye: No discharge.        Left eye: No discharge.     Extraocular Movements: Extraocular movements intact.     Conjunctiva/sclera: Conjunctivae normal.     Pupils: Pupils are equal, round, and reactive to light.  Neck:     Vascular: No carotid bruit.  Cardiovascular:     Rate and Rhythm: Normal rate and regular rhythm.     Pulses: Normal pulses.     Heart sounds: Normal heart sounds. No murmur heard.    No friction rub. No gallop.  Pulmonary:     Effort: Pulmonary effort is normal. No respiratory distress.     Breath sounds: Normal breath sounds. No stridor. No wheezing, rhonchi or rales.  Chest:     Chest wall: No tenderness.  Abdominal:     General: Bowel sounds are normal. There is no distension.     Palpations: Abdomen is soft. There is no hepatomegaly, splenomegaly or mass.     Tenderness: There is no abdominal tenderness. There is no right CVA tenderness, left CVA tenderness,  guarding or rebound.     Hernia: No hernia is present.  Musculoskeletal:        General: No swelling, tenderness, deformity or signs of injury. Normal range of motion.     Cervical back: Normal range of motion and neck supple. No rigidity or tenderness.     Right lower leg: No edema.     Left lower leg: No edema.  Lymphadenopathy:     Cervical: No cervical adenopathy.     Upper Body:     Right upper body: No supraclavicular or axillary adenopathy.     Left upper body: No supraclavicular or axillary adenopathy.     Lower Body: No right inguinal adenopathy. No left inguinal adenopathy.  Skin:    General: Skin is warm and dry.     Coloration:  Skin is not jaundiced or pale.     Findings: No bruising, erythema, lesion or rash.  Neurological:     General: No focal deficit present.     Mental Status: He is alert and oriented to person, place, and time. Mental status is at baseline.     Cranial Nerves: No cranial nerve deficit.     Sensory: No sensory deficit.     Motor: No weakness.     Coordination: Coordination normal.     Gait: Gait normal.     Deep Tendon Reflexes: Reflexes normal.  Psychiatric:        Mood and Affect: Mood normal.        Behavior: Behavior normal.        Thought Content: Thought content normal.        Judgment: Judgment normal.     LABS:      Latest Ref Rng & Units 10/16/2023    8:07 AM 07/17/2023    8:07 AM 04/17/2023   11:04 AM  CBC  WBC 4.0 - 10.5 K/uL 7.1  8.4  7.4   Hemoglobin 13.0 - 17.0 g/dL 16.1  09.6  04.5   Hematocrit 39.0 - 52.0 % 44.9  45.2  41.5   Platelets 150 - 400 K/uL 104  108  98       Latest Ref Rng & Units 10/16/2023    8:07 AM 07/17/2023    8:07 AM 04/17/2023   11:04 AM  CMP  Glucose 70 - 99 mg/dL 409  811  914   BUN 6 - 20 mg/dL 30  28  24    Creatinine 0.61 - 1.24 mg/dL 7.82  9.56  2.13   Sodium 135 - 145 mmol/L 140  138  135   Potassium 3.5 - 5.1 mmol/L 4.2  4.2  4.0   Chloride 98 - 111 mmol/L 98  99  100   CO2 22 - 32  mmol/L 31  29  27    Calcium 8.9 - 10.3 mg/dL 08.6  57.8  9.0   Total Protein 6.5 - 8.1 g/dL 7.7  7.8  7.5   Total Bilirubin 0.0 - 1.2 mg/dL 0.8  1.0  0.9   Alkaline Phos 38 - 126 U/L 74  78  56   AST 15 - 41 U/L 28  27  19    ALT 0 - 44 U/L 32  30  24      STUDIES:  No results found.    HISTORY:   Past Medical History:  Diagnosis Date   Acute kidney injury (HCC)    Allergic rhinitis with postnasal drip    Allergy    Anemia    CAD (coronary artery disease) 05/17/2021   Diuretic-induced hypokalemia 05/02/2021   Dyspnea on exertion    Folate deficiency anemia 05/02/2021   GERD (gastroesophageal reflux disease)    GERD without esophagitis    Hyperbilirubinemia 08/03/2019   Hypertension    Hyponatremia    Immune thrombocytopenic purpura (HCC)    Immune thrombocytopenic purpura (HCC)    Metabolic syndrome    Mixed dyslipidemia 05/17/2021   Morbid obesity (HCC)    Obesity (BMI 35.0-39.9 without comorbidity) 04/18/2021   Obstructive sleep apnea hypopnea, moderate    Post-COVID syndrome    Shortness of breath    Sleep apnea    wears a mouth piece   Steroid-induced diabetes (HCC)    Stress and adjustment reaction    Syncope 10/30/2022   Thrombocytopenia, unspecified (HCC)  Past Surgical History:  Procedure Laterality Date   NO PAST SURGERIES      Family History  Problem Relation Age of Onset   Heart attack Father    Thrombosis Maternal Aunt        fatal   Thrombosis Maternal Uncle        fatal   Heart attack Paternal Uncle    Breast cancer Maternal Grandmother        80s   Thrombosis Maternal Grandmother        fatal   Heart attack Paternal Grandfather     Social History:  reports that he has never smoked. He has quit using smokeless tobacco. He reports that he does not currently use alcohol. He reports that he does not currently use drugs.The patient is alone today.  Allergies:  Allergies  Allergen Reactions   Aspirin Other (See Comments)   Statins  Other (See Comments)    Current Medications: Current Outpatient Medications  Medication Sig Dispense Refill   acetaminophen (TYLENOL) 500 MG tablet Take 500 mg by mouth every 4 (four) hours as needed for moderate pain.     amLODipine (NORVASC) 10 MG tablet Take 10 mg by mouth daily.     Ascorbic Acid (VITAMIN C) 1000 MG tablet Take 1,000 mg by mouth daily.     azelastine (ASTELIN) 0.1 % nasal spray Place 1 spray into both nostrils every 12 (twelve) hours.     Bempedoic Acid-Ezetimibe (NEXLIZET) 180-10 MG TABS Take 1 tablet by mouth daily in the afternoon.     benazepril (LOTENSIN) 20 MG tablet Take 1 tablet (20 mg total) by mouth daily. 90 tablet 3   CALCIUM PO Take 1 tablet by mouth daily.     DULoxetine (CYMBALTA) 60 MG capsule Take 60 mg by mouth daily.     escitalopram (LEXAPRO) 20 MG tablet Take 20 mg by mouth daily.     folic acid (FOLVITE) 1 MG tablet TAKE ONE TABLET BY MOUTH ONCE DAILY 90 tablet 3   furosemide (LASIX) 40 MG tablet Take 1 tablet (40 mg total) by mouth daily. 90 tablet 2   metFORMIN (GLUCOPHAGE-XR) 500 MG 24 hr tablet Take 1,000 mg by mouth 2 (two) times daily.     metoprolol succinate (TOPROL-XL) 100 MG 24 hr tablet Take 1 tablet (100 mg total) by mouth daily. Take with or immediately following a meal. 90 tablet 3   NON FORMULARY Take 700 mg by mouth daily. Apple pectin      omeprazole (PRILOSEC) 20 MG capsule Take 20 mg by mouth daily.     potassium chloride SA (KLOR-CON M) 20 MEQ tablet Take 2 tablets (40 mEq total) by mouth 3 (three) times daily. 180 tablet 5   predniSONE (DELTASONE) 5 MG tablet Take 1 tablet (5 mg total) by mouth daily with breakfast. 90 tablet 1   traZODone (DESYREL) 50 MG tablet Take 50 mg by mouth at bedtime as needed.     vitamin B-12 (CYANOCOBALAMIN) 1000 MCG tablet Take 1,000 mcg by mouth daily.     vitamin k 100 MCG tablet Take 100 mcg by mouth daily.     No current facility-administered medications for this visit.     ASSESSMENT &  PLAN:  Assessment: 1.  Chronic ITP, which remains stable on prednisone 5 mg daily.  2.  Steroid induced diabetes, for which he continues metformin 500 mg twice daily.  He was able to get off of glipizide with weight management.  3.  Elevated Epstein Barr titers.  This may be indicative of chronic fatigue syndrome, and I advised that he rest as needed.   4.  Sleep apnea.  He was referred to pulmonary.   5.  Chronic anxiety, mainly related to his health, and also to his job and insurance issues.  He is currently on Cymbalta 60 mg daily.  He continues to see our LCSW for counseling.  6.  Hospital admission with acute kidney injury due to dehydration associated with COVID-19, May 2022.   He was placed on an extremely slow prednisone taper but we were unable to completely taper him off.     7.  Significant left hip and lower back pain due to osteoarthritis and degenerative disc disease.  He is following with a spine specialist.  He is undergoing physical therapy. MRI of the lumbar spine revealed degenerative changes of L4-5. His surgeon does not feel comfortable doing any invasive procedure or surgery with his persistent thrombocytopenia. Now he has bilateral shoulder pain with no clear explanation and may need further evaluation by an orthopedic surgeon.   8.  Elevated bilirubin which has persisted but remains stable. I suspect possible Gilbert's syndrome as his sister has the same trait but we will hold off on ordering [id number] as it would not change our plan of care.   9. Episodes of pre-syncope which are unexplained and could be cardiac or medication or hypoglycemia. In view of the possibility of a petit mal seizure or CNS hemorrhage, a brain MRI was done. It was then repeated with contrast. His MRI of the head in April, 2024 revealed abnormal size and shape of the ventricles with asymmetric enlargement of the left frontal horn. We don't know if this is congenital or whether it represents  hydrocephalus.  A repeat scan was suggested in 6 months, in October to see if there is any difference.  He was seen by neurosurgery, who planned repeat MRI head as recommended. He referred the patient to a neurologist, Dr Teresa Coombs to have an EEG done. He was prescribed Keppra for possible seizures but did not start that.  He had a EEG done.  He denies continued episodes of presyncope.    10. Long-term chronic prednisone which puts him at risk for osteoporosis. I will schedule a bone density scan and call him with the results.   Plan: He denies abnormal bleeding or bruising. Patient states that he feels ok but complains of severe chronic lower back pain and an intermittent aching/sharp pain in his bilateral shoulder area. He thinks that this could be due to a side effect of Nexlizet 180-10mg , as this new pain did not start until around the time he began this. He previously used to go to a orthopedic surgeon but has stopped going for some time. He has a WBC of 7.1, hemoglobin of 15.5, and a low platelet count 104,000 down from 108,000. His CMP is normal other than a elevated BUN of 30 up from 28. His lipid, hemoglobin A1c, and TSH levels today are pending. I instructed him to increase his fluids. Patient continues Prednisone 5mg  without difficulty but has not had a bone density. I explained the importance of getting one since he is prone to developing osteopenia or osteoporosis while on Prednisone. I will schedule a bone density scan for him today and I will call him with the results and any recommendations.  I will see him back in 3 months with CBC and CMP. The patient understands the  plans discussed today and is in agreement with them.  He knows to contact our office if he develops concerns prior to his next appointment.    I provided 16 minutes of face-to-face time during this encounter and > 50% was spent counseling as documented under my assessment and plan.   Dellia Beckwith, MD   [facility] - A DEPT OF [facility] [address] [place] [address] Dept: [phone] Dept Fax: [phone]   No orders of the defined types were placed in this encounter.    I,Jasmine M Lassiter,acting as a scribe for Dellia Beckwith, MD.,have documented all relevant documentation on the behalf of Dellia Beckwith, MD,as directed by  Dellia Beckwith, MD while in the presence of Dellia Beckwith, MD.

## 2023-10-16 ENCOUNTER — Other Ambulatory Visit: Payer: Self-pay | Admitting: Oncology

## 2023-10-16 ENCOUNTER — Encounter: Payer: Self-pay | Admitting: *Deleted

## 2023-10-16 ENCOUNTER — Ambulatory Visit: Payer: Medicare Other | Attending: Cardiology | Admitting: Cardiology

## 2023-10-16 ENCOUNTER — Encounter: Payer: Self-pay | Admitting: Oncology

## 2023-10-16 ENCOUNTER — Inpatient Hospital Stay: Payer: Medicare Other

## 2023-10-16 ENCOUNTER — Inpatient Hospital Stay: Payer: Medicare Other | Attending: Hematology and Oncology | Admitting: Oncology

## 2023-10-16 ENCOUNTER — Encounter: Payer: Self-pay | Admitting: Cardiology

## 2023-10-16 ENCOUNTER — Telehealth: Payer: Self-pay | Admitting: Oncology

## 2023-10-16 VITALS — BP 114/68 | HR 57 | Temp 98.3°F | Resp 16 | Ht 71.0 in | Wt 191.4 lb

## 2023-10-16 VITALS — BP 100/60 | HR 54 | Ht 71.0 in | Wt 190.0 lb

## 2023-10-16 DIAGNOSIS — I2510 Atherosclerotic heart disease of native coronary artery without angina pectoris: Secondary | ICD-10-CM

## 2023-10-16 DIAGNOSIS — Z803 Family history of malignant neoplasm of breast: Secondary | ICD-10-CM | POA: Diagnosis not present

## 2023-10-16 DIAGNOSIS — E559 Vitamin D deficiency, unspecified: Secondary | ICD-10-CM

## 2023-10-16 DIAGNOSIS — M25511 Pain in right shoulder: Secondary | ICD-10-CM | POA: Diagnosis not present

## 2023-10-16 DIAGNOSIS — Z789 Other specified health status: Secondary | ICD-10-CM | POA: Diagnosis not present

## 2023-10-16 DIAGNOSIS — D696 Thrombocytopenia, unspecified: Secondary | ICD-10-CM

## 2023-10-16 DIAGNOSIS — E782 Mixed hyperlipidemia: Secondary | ICD-10-CM | POA: Diagnosis not present

## 2023-10-16 DIAGNOSIS — D693 Immune thrombocytopenic purpura: Secondary | ICD-10-CM | POA: Diagnosis not present

## 2023-10-16 DIAGNOSIS — G4730 Sleep apnea, unspecified: Secondary | ICD-10-CM | POA: Insufficient documentation

## 2023-10-16 DIAGNOSIS — E1169 Type 2 diabetes mellitus with other specified complication: Secondary | ICD-10-CM

## 2023-10-16 DIAGNOSIS — I10 Essential (primary) hypertension: Secondary | ICD-10-CM | POA: Diagnosis not present

## 2023-10-16 DIAGNOSIS — Z7984 Long term (current) use of oral hypoglycemic drugs: Secondary | ICD-10-CM | POA: Insufficient documentation

## 2023-10-16 DIAGNOSIS — Z7952 Long term (current) use of systemic steroids: Secondary | ICD-10-CM | POA: Insufficient documentation

## 2023-10-16 DIAGNOSIS — M1612 Unilateral primary osteoarthritis, left hip: Secondary | ICD-10-CM | POA: Insufficient documentation

## 2023-10-16 DIAGNOSIS — E099 Drug or chemical induced diabetes mellitus without complications: Secondary | ICD-10-CM | POA: Diagnosis not present

## 2023-10-16 DIAGNOSIS — G8929 Other chronic pain: Secondary | ICD-10-CM | POA: Diagnosis not present

## 2023-10-16 DIAGNOSIS — R55 Syncope and collapse: Secondary | ICD-10-CM | POA: Insufficient documentation

## 2023-10-16 DIAGNOSIS — F419 Anxiety disorder, unspecified: Secondary | ICD-10-CM | POA: Diagnosis not present

## 2023-10-16 DIAGNOSIS — R7989 Other specified abnormal findings of blood chemistry: Secondary | ICD-10-CM | POA: Diagnosis not present

## 2023-10-16 DIAGNOSIS — E876 Hypokalemia: Secondary | ICD-10-CM | POA: Insufficient documentation

## 2023-10-16 DIAGNOSIS — I119 Hypertensive heart disease without heart failure: Secondary | ICD-10-CM

## 2023-10-16 DIAGNOSIS — M25512 Pain in left shoulder: Secondary | ICD-10-CM | POA: Insufficient documentation

## 2023-10-16 DIAGNOSIS — Z79899 Other long term (current) drug therapy: Secondary | ICD-10-CM | POA: Diagnosis not present

## 2023-10-16 LAB — CBC WITH DIFFERENTIAL (CANCER CENTER ONLY)
Abs Immature Granulocytes: 0.03 10*3/uL (ref 0.00–0.07)
Basophils Absolute: 0.1 10*3/uL (ref 0.0–0.1)
Basophils Relative: 1 %
Eosinophils Absolute: 0.2 10*3/uL (ref 0.0–0.5)
Eosinophils Relative: 3 %
HCT: 44.9 % (ref 39.0–52.0)
Hemoglobin: 15.5 g/dL (ref 13.0–17.0)
Immature Granulocytes: 0 %
Immature Platelet Fraction: 3 % (ref 1.2–8.6)
Lymphocytes Relative: 34 %
Lymphs Abs: 2.4 10*3/uL (ref 0.7–4.0)
MCH: 30 pg (ref 26.0–34.0)
MCHC: 34.5 g/dL (ref 30.0–36.0)
MCV: 87 fL (ref 80.0–100.0)
Monocytes Absolute: 0.6 10*3/uL (ref 0.1–1.0)
Monocytes Relative: 8 %
Neutro Abs: 3.9 10*3/uL (ref 1.7–7.7)
Neutrophils Relative %: 54 %
Platelet Count: 104 10*3/uL — ABNORMAL LOW (ref 150–400)
RBC: 5.16 MIL/uL (ref 4.22–5.81)
RDW: 12.5 % (ref 11.5–15.5)
WBC Count: 7.1 10*3/uL (ref 4.0–10.5)
nRBC: 0 % (ref 0.0–0.2)
nRBC: 0 /100{WBCs}

## 2023-10-16 LAB — CMP (CANCER CENTER ONLY)
ALT: 32 U/L (ref 0–44)
AST: 28 U/L (ref 15–41)
Albumin: 4.8 g/dL (ref 3.5–5.0)
Alkaline Phosphatase: 74 U/L (ref 38–126)
Anion gap: 10 (ref 5–15)
BUN: 30 mg/dL — ABNORMAL HIGH (ref 6–20)
CO2: 31 mmol/L (ref 22–32)
Calcium: 10.1 mg/dL (ref 8.9–10.3)
Chloride: 98 mmol/L (ref 98–111)
Creatinine: 1.09 mg/dL (ref 0.61–1.24)
GFR, Estimated: >60 mL/min (ref >60)
Glucose, Bld: 113 mg/dL — ABNORMAL HIGH (ref 70–99)
Potassium: 4.2 mmol/L (ref 3.5–5.1)
Sodium: 140 mmol/L (ref 135–145)
Total Bilirubin: 0.8 mg/dL (ref 0.0–1.2)
Total Protein: 7.7 g/dL (ref 6.5–8.1)

## 2023-10-16 LAB — LIPID PANEL
Cholesterol: 124 mg/dL (ref 0–200)
HDL: 53 mg/dL (ref >40)
LDL Cholesterol: 59 mg/dL (ref 0–99)
Total CHOL/HDL Ratio: 2.3 {ratio}
Triglycerides: 60 mg/dL (ref <150)
VLDL: 12 mg/dL (ref 0–40)

## 2023-10-16 LAB — TSH: TSH: 3.517 u[IU]/mL (ref 0.350–4.500)

## 2023-10-16 LAB — HEMOGLOBIN A1C
Hgb A1c MFr Bld: 5.1 % (ref 4.8–5.6)
Mean Plasma Glucose: 99.67 mg/dL

## 2023-10-16 NOTE — Patient Instructions (Signed)
Medication Instructions:  Your physician recommends that you continue on your current medications as directed. Please refer to the Current Medication list given to you today.  *If you need a refill on your cardiac medications before your next appointment, please call your pharmacy*   Lab Work: NONE If you have labs (blood work) drawn today and your tests are completely normal, you will receive your results only by: MyChart Message (if you have MyChart) OR A paper copy in the mail If you have any lab test that is abnormal or we need to change your treatment, we will call you to review the results.   Testing/Procedures: NONE   Follow-Up: At [facility], you and your health needs are our priority.  As part of our continuing mission to provide you with exceptional heart care, we have created designated Provider Care Teams.  These Care Teams include your primary Cardiologist (physician) and Advanced Practice Providers (APPs -  Physician Assistants and Nurse Practitioners) who all work together to provide you with the care you need, when you need it.  We recommend signing up for the patient portal called "MyChart".  Sign up information is provided on this After Visit Summary.  MyChart is used to connect with patients for Virtual Visits (Telemedicine).  Patients are able to view lab/test results, encounter notes, upcoming appointments, etc.  Non-urgent messages can be sent to your provider as well.   To learn more about what you can do with MyChart, go to [email or web address].    Your next appointment:   6 month(s)  Provider:   Belva Crome, MD    Other Instructions

## 2023-10-16 NOTE — Telephone Encounter (Signed)
Patient has been scheduled for follow-up visit per 10/16/23 LOS.  Pt given an appt calendar with date and time.

## 2023-10-22 ENCOUNTER — Other Ambulatory Visit (HOSPITAL_BASED_OUTPATIENT_CLINIC_OR_DEPARTMENT_OTHER): Payer: Medicare Other | Admitting: Radiology

## 2023-10-22 ENCOUNTER — Inpatient Hospital Stay: Payer: Medicare Other

## 2023-10-28 ENCOUNTER — Ambulatory Visit (INDEPENDENT_AMBULATORY_CARE_PROVIDER_SITE_OTHER)
Admission: RE | Admit: 2023-10-28 | Discharge: 2023-10-28 | Disposition: A | Discharge status: Home / Self Care | Payer: Medicare Other | Source: Ambulatory Visit | Attending: Oncology | Admitting: Oncology

## 2023-10-28 DIAGNOSIS — T380X5S Adverse effect of glucocorticoids and synthetic analogues, sequela: Secondary | ICD-10-CM | POA: Diagnosis not present

## 2023-10-28 DIAGNOSIS — E099 Drug or chemical induced diabetes mellitus without complications: Secondary | ICD-10-CM

## 2023-10-28 DIAGNOSIS — E119 Type 2 diabetes mellitus without complications: Secondary | ICD-10-CM | POA: Diagnosis not present

## 2023-10-30 DIAGNOSIS — E1169 Type 2 diabetes mellitus with other specified complication: Secondary | ICD-10-CM | POA: Diagnosis not present

## 2023-10-30 DIAGNOSIS — E785 Hyperlipidemia, unspecified: Secondary | ICD-10-CM | POA: Diagnosis not present

## 2023-10-30 DIAGNOSIS — F419 Anxiety disorder, unspecified: Secondary | ICD-10-CM | POA: Diagnosis not present

## 2023-10-30 DIAGNOSIS — F4329 Adjustment disorder with other symptoms: Secondary | ICD-10-CM | POA: Diagnosis not present

## 2023-11-30 ENCOUNTER — Other Ambulatory Visit (HOSPITAL_BASED_OUTPATIENT_CLINIC_OR_DEPARTMENT_OTHER): Payer: Self-pay

## 2023-12-11 ENCOUNTER — Ambulatory Visit: Payer: Medicare Other | Attending: Cardiology | Admitting: Pharmacist

## 2023-12-11 ENCOUNTER — Other Ambulatory Visit: Payer: Self-pay

## 2023-12-11 ENCOUNTER — Other Ambulatory Visit (HOSPITAL_COMMUNITY): Payer: Self-pay

## 2023-12-11 DIAGNOSIS — E782 Mixed hyperlipidemia: Secondary | ICD-10-CM | POA: Diagnosis not present

## 2023-12-11 MED ORDER — NEXLIZET 180-10 MG PO TABS
1.0000 | ORAL_TABLET | Freq: Every day | ORAL | 3 refills | Status: DC
  Filled 2023-12-11: qty 90, 90d supply, fill #0
  Filled 2024-03-05: qty 90, 90d supply, fill #1
  Filled 2024-06-03: qty 90, 90d supply, fill #2
  Filled 2024-09-13: qty 30, 30d supply, fill #3

## 2023-12-11 NOTE — Assessment & Plan Note (Addendum)
 Assessment:  Per oncologist, patient is unable to take statins due to significant decrease in platelets after starting rosuvastatin.  Patient reports adherence to current regimen of Nexlizet. He was previously complaining of shoulder pain that has since ceased since stopping Nexlizet for 2 weeks. He has been back on Nexlizet for well over a month with no symptoms.  LDL 59 close to goal of LDL < 55 mg/dl  Patient has been picking up Nexlizet samples from the office and is unable to afford at the pharmacy. He does not report any other issues with affording his medications.  [employer] grant approved in November 2024 but was not explained to patient that he could use this grant at the pharmacy.  Patient indicates preference for continuing Nexlizet over switching to PCKS9, not interested in shots.  Plan:  Continue Nexlizet 180-10 mg daily.  Continue to monitor for side effects including muscle cramps and shoulder pain and contact clinic if any concerns.  Sent [employer] grant information to [employer] where he will get his medication delivered at no cost.  Congratulated patient for his efforts thus far in significant weight loss, and encouraged patient to continue with healthy lifestyle habits, including eating balanced meals and aerobic/strength exercises.  Repeat labs in 3 months.

## 2023-12-11 NOTE — Patient Instructions (Signed)
 Total amount awarded, $2500.  Effective: 06/08/23 - 06/06/24   ZOX:[id number] [id number]

## 2023-12-11 NOTE — Progress Notes (Signed)
 Patient ID: Mike [name]                 DOB: Feb 19, 1971                    MRN: [id number]      HPI: Mike [name] is a 53 y.o. male patient referred to lipid clinic by Mike Bamberg, NP. PMH is significant for nonobstructive CAD coronary CTA in 2022 - coronary calcium score 296, 96th percentile, hypertension, OSA, GERD, idiopathic thrombocytopenia, dyslipidemia with statin intolerance, has tried rosuvastatin 10 mg x 1 month in the past which dropped his platelets by 34K within 2 weeks. His oncologist, Dr. Gilman [name], recommends not retrialing a statin due to his ITP. Last FLP on 10/16/23: LDL 59, HDL 53, TG 60, TC 865. Referred to lipid clinic for further discussion about continuing his Nexlizet versus trying a PCKS9 inhibitor.   He was initiated on Nexlizet in July 2024. Per last cardiology visit 10/16/23, he has been bothered by shoulder pains and is concerned it is related to the medication. It is also cost prohibitive for him. Of note, [employer] was applied for and approved back in November, as well as [employer]. Unfortunately the info about the approval never got back to the patient, so he has been picking up samples from the office weekly.  He went on Nexlizet holiday for 2 weeks, resumed a little over a month ago. His shoulder pains went away during the holiday and have not returned since restarting. No new symptoms of concern since restarting Nexlizet.   Discussed LDL goal of < 55 mg/dl and how his LDL of 59 is very close to goal. Patient indicates preference for continuing Nexlizet as he does not want to do shots and thinks this medication works well for him in combination with his lifestyle improvements. He does regular weight lifting and is careful about his diet - he used to be 317 lbs from high-dose steroid course and has made significant weight loss progress. He stays well hydrated but drinks a lot of soda throughout the day.   Current Medications: Nexlizet 180-10 mg  daily Intolerances: rosuvastatin 10 mg Risk Factors: CAD (coronary calcium score 296, 96th percentile) LDL-C goal: < 55 mg/dl due to young age ApoB goal: < 70 mg/dl  Diet: lots of protein (chicken), no bread, vegetables Drinks: drinks lots of water, drinks Diet Dr. Reino [name] daily   Exercise: walking, weight lifting with low weight and high reps  Family History:  Father Heart attack    Maternal Aunt Thrombosis    Maternal Uncle Thrombosis    Paternal Uncle Heart attack     Maternal Grandmother Breast cancer  Thrombosis    Paternal Grandfather Heart attack        Social History: Reports that he has never smoked. He has quit using smokeless tobacco. He reports that he does not currently use alcohol. He reports that he does not currently use drugs. The patient is accompanied by his sister, Mike [name].   Labs: Lipid Panel     Component Value Date/Time   CHOL 124 10/16/2023 0807   TRIG 60 10/16/2023 0807   HDL 53 10/16/2023 0807   CHOLHDL 2.3 10/16/2023 0807   VLDL 12 10/16/2023 0807   LDLCALC 59 10/16/2023 0807    Past Medical History:  Diagnosis Date   Acute kidney injury (HCC)    Allergic rhinitis with postnasal drip    Allergy    Anemia    CAD (  coronary artery disease) 05/17/2021   Diuretic-induced hypokalemia 05/02/2021   Dyspnea on exertion    Folate deficiency anemia 05/02/2021   GERD (gastroesophageal reflux disease)    GERD without esophagitis    Hyperbilirubinemia 08/03/2019   Hypertension    Hyponatremia    Immune thrombocytopenic purpura (HCC)    Immune thrombocytopenic purpura (HCC)    Metabolic syndrome    Mixed dyslipidemia 05/17/2021   Morbid obesity (HCC)    Obesity (BMI 35.0-39.9 without comorbidity) 04/18/2021   Obstructive sleep apnea hypopnea, moderate    Post-COVID syndrome    Shortness of breath    Sleep apnea    wears a mouth piece   Steroid-induced diabetes (HCC)    Stress and adjustment reaction    Syncope 10/30/2022    Thrombocytopenia, unspecified (HCC)     Current Outpatient Medications on File Prior to Visit  Medication Sig Dispense Refill   acetaminophen (TYLENOL) 500 MG tablet Take 500 mg by mouth every 4 (four) hours as needed for moderate pain.     amLODipine (NORVASC) 10 MG tablet Take 10 mg by mouth daily.     Ascorbic Acid (VITAMIN C) 1000 MG tablet Take 1,000 mg by mouth daily.     azelastine (ASTELIN) 0.1 % nasal spray Place 1 spray into both nostrils every 12 (twelve) hours.     benazepril (LOTENSIN) 20 MG tablet Take 1 tablet (20 mg total) by mouth daily. 90 tablet 3   CALCIUM PO Take 1 tablet by mouth daily.     DULoxetine (CYMBALTA) 60 MG capsule Take 60 mg by mouth daily.     escitalopram (LEXAPRO) 20 MG tablet Take 20 mg by mouth daily.     folic acid (FOLVITE) 1 MG tablet TAKE ONE TABLET BY MOUTH ONCE DAILY 90 tablet 3   metFORMIN (GLUCOPHAGE-XR) 500 MG 24 hr tablet Take 1,000 mg by mouth 2 (two) times daily.     metoprolol succinate (TOPROL-XL) 100 MG 24 hr tablet Take 1 tablet (100 mg total) by mouth daily. Take with or immediately following a meal. 90 tablet 3   NON FORMULARY Take 700 mg by mouth daily. Apple pectin      omeprazole (PRILOSEC) 20 MG capsule Take 20 mg by mouth daily.     potassium chloride SA (KLOR-CON M) 20 MEQ tablet Take 2 tablets (40 mEq total) by mouth 3 (three) times daily. 180 tablet 5   predniSONE (DELTASONE) 5 MG tablet Take 1 tablet (5 mg total) by mouth daily with breakfast. 90 tablet 1   traZODone (DESYREL) 50 MG tablet Take 50 mg by mouth at bedtime as needed.     vitamin B-12 (CYANOCOBALAMIN) 1000 MCG tablet Take 1,000 mcg by mouth daily.     vitamin k 100 MCG tablet Take 100 mcg by mouth daily.     furosemide (LASIX) 40 MG tablet Take 1 tablet (40 mg total) by mouth daily. 90 tablet 2   No current facility-administered medications on file prior to visit.    Allergies  Allergen Reactions   Aspirin Other (See Comments)   Statins Other (See Comments)     Assessment/Plan:  1. Hyperlipidemia -  Mixed dyslipidemia Assessment:  Per oncologist, patient is unable to take statins due to significant decrease in platelets after starting rosuvastatin.  Patient reports adherence to current regimen of Nexlizet. He was previously complaining of shoulder pain that has since ceased since stopping Nexlizet for 2 weeks. He has been back on Nexlizet for well over a month with  no symptoms.  LDL 59 close to goal of LDL < 55 mg/dl  Patient has been picking up Nexlizet samples from the office and is unable to afford at the pharmacy. He does not report any other issues with affording his medications.  [employer] grant approved in November 2024 but was not explained to patient that he could use this grant at the pharmacy.  Patient indicates preference for continuing Nexlizet over switching to PCKS9, not interested in shots.  Plan:  Continue Nexlizet 180-10 mg daily.  Continue to monitor for side effects including muscle cramps and shoulder pain and contact clinic if any concerns.  Sent [employer] grant information to [employer] where he will get his medication delivered at no cost.  Congratulated patient for his efforts thus far in significant weight loss, and encouraged patient to continue with healthy lifestyle habits, including eating balanced meals and aerobic/strength exercises.  Repeat labs in 3 months.    Thank you,  Lendon Ka, PharmD Candidate [address] [facility] 12/11/23  Olene Floss, Pharm.D, BCACP, CPP [facility] [address], [place], [place] [address]  Phone: [phone]; Fax: [phone]

## 2023-12-28 ENCOUNTER — Other Ambulatory Visit: Payer: Self-pay | Admitting: Cardiology

## 2024-01-12 NOTE — Progress Notes (Signed)
 [facility]  [address] [place],  [place]  [address] [phone]  Clinic Day:  01/13/24  Referring physician: Trudi Fus, NP  CHIEF COMPLAINT:  CC: Chronic ITP  Current Treatment: Prednisone  5 mg daily  HISTORY OF PRESENT ILLNESS:  Mike [name] is a 53 y.o. male with a history of severe thrombocytopenia consistent with immune thrombocytopenic purpura diagnosed in May 2021.  Further laboratory evaluation did not reveal any other specific etiology for his thrombocytopenia.  He had mildly elevated liver transaminases.  Abdominal ultrasound revealed increased echogenicity of hepatic parenchyma suggesting hepatic steatosis, with probable fatty sparing adjacent to gallbladder fossa.  No other abnormality was seen.  We recommended bone marrow examination, but the patient and his mother declined.  The platelet count continued to fluctuate up and down, but as his platelet count remained 25,000 in June 2021, he was started on prednisone  20 mg 3 times daily, with a good response.  He has had leukocytosis secondary to the high-dose prednisone .  He also has had oral candidiasis on occasion. Mike [name] titers were elevated, so this may be representative of chronic fatigue syndrome. We were unable to taper him off the prednisone , so he was treated with rituximab  weekly for 4 weeks in October and November 2021.  We slowly tapered the prednisone  after that.  He had complications of significant weight gain, hypertension, steroid induced diabetes and steroid induced myopathy. He has also had hypokalemia resolved with oral supplementation.     On May 18th, 2022 his platelet count was 151,000, so prednisone  was decreased to 5 mg daily. He was admitted with COVID-19 on May 24th after leaving work earlier due to altered mental status and generalized weakness. He had acute kidney injury with a creatinine of 2.2, which normalized with IV hydration. He had fever, but no respiratory symptoms. He was  discharged on a prolonged prednisone  taper over 15 weeks.  He had been having some chest pain and dyspnea since discontinuing indapamide, so was referred to Dr. Lafayette Pierre in Cardiology. He placed him on furosemide  40 mg daily and metoprolol  50 mg daily.  He had had problems with hypokalemia for a long time, but this worsened with the diuretic, so potassium was increased to 40 mg twice daily.  The lower extremity edema has resolved with the furosemide .  ECHO revealed left ventricular hypertrophy with an EF between 55-60%, and the CT of the coronary arteries looks good.  He was placed on gabapentin 300 mg BID for neuropathy.  He was on a extremely slow taper of the prednisone , but his platelets dropped to 73,000 when the dose was decreased to 5 mg three times per wee, so we resumed 5 mg daily.  Gabapentin was discontinued as it wasn't effective.  MRI lumbar spine in October 2022 revealed degenerative changes of the lumbar spine most prominent L4-5.  He was recently referred to spinal specialist.  He was hesitant to undergo epidural injection due to thrombocytopenia, although his platelets were usually above 75,000.  He has remained on prednisone  5 mg daily and his platelet count has remained above 75,000 since October 2022.  We began to space out his visits over time.  He has had mild elevation of the bilirubin, felt to be most likely Gilbert's syndrome.  Testing was not done. He has been able to lose weight over the years.  He has struggled with chronic anxiety and was previously seen by psychiatry.  He began seeing our licensed clinical social worker in  August 2023 for counseling.   He reported presyncopal episodes so underwent MRI head in March 2024.  He initially declined contrast, but there were some abnormalities, so he underwent MRI head with contrast in April which revealed stable size and shape of the ventricular system from March with asymmetric enlargement of the left frontal horn. No contrast-enhancing  lesion is visualized to suggest an obstructive cause for ventricular configuration. There were few vessels that course along the region of the foramen of Monro. Overall, this was a nonspecific finding and may be congenital. Follow up brain MRI with a CISS sequence is recommended in 6 months to assess for stability and exclude an underlying lesion. Additionally, earlier follow up was recommended if there is an acute change in patient's symptoms (worsening headaches or new neurologic symptoms) to exclude the possibility of hydrocephalus.  We referred him to neurosurgery who subsequently referred him to neurology.  INTERVAL HISTORY:  Mike [name] is here today for repeat clinical assessment for his chronic ITP.  He denies abnormal bleeding or bruising. Patient states that he feels ok but complains of shoulder and lower back pain. He continues Prednisone  5 mg once daily. Mike [name] has a WBC of 7.0, hemoglobin of 14.8, and platelet count of 110,000 improved from 104,000. His CMP is normal other than a elevated BUN of 37 up from 30. He is currently taking Lasix  40 mg daily and states he drinks a 24 pack of water a day and 2 packs of soda. I informed him with taking Lasix  and caffeinated drinks he is prone to urinate more and lose more fluid. I advised him to increase his water intake instead of soda. He had a bone density scan done on 10/28/2023 which was completely normal. I recommended that if he is still on Prednisone  to have a repeat in 5 years. I will see him back in 3 months with CBC and CMP. He denies fever, chills, night sweats, or other signs of infection. He denies cardiorespiratory and gastrointestinal issues. His appetite is ok and his weight has decreased 2 pounds over last 3 months.    REVIEW OF SYSTEMS:  Review of Systems  Constitutional:  Negative for appetite change, chills, diaphoresis, fatigue, fever and unexpected weight change.  HENT:  Negative.  Negative for hearing loss, lump/mass, mouth sores,  nosebleeds, sore throat, tinnitus, trouble swallowing and voice change.   Eyes: Negative.  Negative for eye problems and icterus.  Respiratory: Negative.  Negative for chest tightness, cough, hemoptysis, shortness of breath and wheezing.   Cardiovascular: Negative.  Negative for chest pain, leg swelling and palpitations.  Gastrointestinal: Negative.  Negative for abdominal distention, abdominal pain, blood in stool, constipation, diarrhea, nausea, rectal pain and vomiting.  Endocrine: Negative.  Negative for hot flashes.  Genitourinary: Negative.  Negative for bladder incontinence, difficulty urinating, dyspareunia, dysuria, frequency, hematuria, nocturia, pelvic pain and penile discharge.   Musculoskeletal:  Positive for back pain (Lower back) and gait problem (difficulty ambulating). Negative for arthralgias, flank pain, myalgias, neck pain and neck stiffness.       Aching/sharp intermittent bilateral shoulder pain  Skin: Negative.  Negative for itching, rash and wound.  Neurological:  Positive for extremity weakness, gait problem (difficulty ambulating) and numbness (Bilateral hands). Negative for dizziness, headaches, light-headedness, seizures and speech difficulty.  Hematological: Negative.  Negative for adenopathy. Does not bruise/bleed easily.  Psychiatric/Behavioral:  Positive for sleep disturbance. Negative for confusion, decreased concentration, depression and suicidal ideas. The patient is nervous/anxious.      VITALS:  Blood pressure 116/70, pulse 64, temperature 98.3 F (36.8 C), temperature source Oral, resp. rate 16, height 5\' 11"  (1.803 m), weight 188 lb 9.6 oz (85.5 kg), SpO2 100%.  Wt Readings from Last 3 Encounters:  01/13/24 188 lb 9.6 oz (85.5 kg)  10/16/23 190 lb (86.2 kg)  10/16/23 191 lb 6.4 oz (86.8 kg)    Body mass index is 26.3 kg/m.  Performance status (ECOG): 1 - Symptomatic but completely ambulatory  PHYSICAL EXAM:  Physical Exam Vitals and nursing note  reviewed.  Constitutional:      General: He is not in acute distress.    Appearance: Normal appearance. He is normal weight. He is not ill-appearing, toxic-appearing or diaphoretic.  HENT:     Head: Normocephalic and atraumatic.     Right Ear: Tympanic membrane, ear canal and external ear normal. There is no impacted cerumen.     Left Ear: Tympanic membrane, ear canal and external ear normal. There is no impacted cerumen.     Nose: Nose normal. No congestion or rhinorrhea.     Mouth/Throat:     Mouth: Mucous membranes are moist.     Pharynx: Oropharynx is clear. No oropharyngeal exudate or posterior oropharyngeal erythema.  Eyes:     General: No scleral icterus.       Right eye: No discharge.        Left eye: No discharge.     Extraocular Movements: Extraocular movements intact.     Conjunctiva/sclera: Conjunctivae normal.     Pupils: Pupils are equal, round, and reactive to light.  Neck:     Vascular: No carotid bruit.  Cardiovascular:     Rate and Rhythm: Normal rate and regular rhythm.     Pulses: Normal pulses.     Heart sounds: Normal heart sounds. No murmur heard.    No friction rub. No gallop.  Pulmonary:     Effort: Pulmonary effort is normal. No respiratory distress.     Breath sounds: Normal breath sounds. No stridor. No wheezing, rhonchi or rales.  Chest:     Chest wall: No tenderness.  Abdominal:     General: Bowel sounds are normal. There is no distension.     Palpations: Abdomen is soft. There is no hepatomegaly, splenomegaly or mass.     Tenderness: There is no abdominal tenderness. There is no right CVA tenderness, left CVA tenderness, guarding or rebound.     Hernia: No hernia is present.  Musculoskeletal:        General: No swelling, tenderness, deformity or signs of injury. Normal range of motion.     Cervical back: Normal range of motion and neck supple. No rigidity or tenderness.     Right lower leg: No edema.     Left lower leg: No edema.   Lymphadenopathy:     Cervical: No cervical adenopathy.     Upper Body:     Right upper body: No supraclavicular or axillary adenopathy.     Left upper body: No supraclavicular or axillary adenopathy.     Lower Body: No right inguinal adenopathy. No left inguinal adenopathy.  Skin:    General: Skin is warm and dry.     Coloration: Skin is not jaundiced or pale.     Findings: No bruising, erythema, lesion or rash.  Neurological:     General: No focal deficit present.     Mental Status: He is alert and oriented to person, place, and time. Mental status is at baseline.  Cranial Nerves: No cranial nerve deficit.     Sensory: No sensory deficit.     Motor: No weakness.     Coordination: Coordination normal.     Gait: Gait normal.     Deep Tendon Reflexes: Reflexes normal.  Psychiatric:        Mood and Affect: Mood normal.        Behavior: Behavior normal.        Thought Content: Thought content normal.        Judgment: Judgment normal.    LABS:      Latest Ref Rng & Units 01/13/2024    8:00 AM 10/16/2023    8:07 AM 07/17/2023    8:07 AM  CBC  WBC 4.0 - 10.5 K/uL 7.0  7.1  8.4   Hemoglobin 13.0 - 17.0 g/dL 29.5  62.1  30.8   Hematocrit 39.0 - 52.0 % 42.7  44.9  45.2   Platelets 150 - 400 K/uL 110  104  108       Latest Ref Rng & Units 01/13/2024    8:00 AM 10/16/2023    8:07 AM 07/17/2023    8:07 AM  CMP  Glucose 70 - 99 mg/dL 657  846  962   BUN 6 - 20 mg/dL 37  30  28   Creatinine 0.61 - 1.24 mg/dL 9.52  8.41  3.24   Sodium 135 - 145 mmol/L 142  140  138   Potassium 3.5 - 5.1 mmol/L 3.9  4.2  4.2   Chloride 98 - 111 mmol/L 102  98  99   CO2 22 - 32 mmol/L 27  31  29    Calcium  8.9 - 10.3 mg/dL 9.9  40.1  02.7   Total Protein 6.5 - 8.1 g/dL 7.5  7.7  7.8   Total Bilirubin 0.0 - 1.2 mg/dL 0.7  0.8  1.0   Alkaline Phos 38 - 126 U/L 67  74  78   AST 15 - 41 U/L 29  28  27    ALT 0 - 44 U/L 30  32  30    Lab Results  Component Value Date   TSH 3.517 10/16/2023    STUDIES:  EXAM: 10/28/2023 DUAL X-RAY ABSORPTIOMETRY (DXA) FOR BONE MINERAL DENSITY  AP Spine L1-L4 (L3) 10/28/2023 53.0 Normal 4.0 1.646 g/cm2 DualFemur Neck Right 10/28/2023 53.0 Normal 1.4 1.238 g/cm2 DualFemur Total Mean 10/28/2023 53.0 Normal 1.6 1.211 g/cm2  HISTORY:   Past Medical History:  Diagnosis Date   Acute kidney injury (HCC)    Allergic rhinitis with postnasal drip    Allergy    Anemia    CAD (coronary artery disease) 05/17/2021   Diuretic-induced hypokalemia 05/02/2021   Dyspnea on exertion    Folate deficiency anemia 05/02/2021   GERD (gastroesophageal reflux disease)    GERD without esophagitis    Hyperbilirubinemia 08/03/2019   Hypertension    Hyponatremia    Immune thrombocytopenic purpura (HCC)    Immune thrombocytopenic purpura (HCC)    Metabolic syndrome    Mixed dyslipidemia 05/17/2021   Morbid obesity (HCC)    Obesity (BMI 35.0-39.9 without comorbidity) 04/18/2021   Obstructive sleep apnea hypopnea, moderate    Post-COVID syndrome    Shortness of breath    Sleep apnea    wears a mouth piece   Steroid-induced diabetes (HCC)    Stress and adjustment reaction    Syncope 10/30/2022   Thrombocytopenia, unspecified (HCC)     Past Surgical History:  Procedure Laterality  Date   NO PAST SURGERIES      Family History  Problem Relation Age of Onset   Heart attack Father    Thrombosis Maternal Aunt        fatal   Thrombosis Maternal Uncle        fatal   Heart attack Paternal Uncle    Breast cancer Maternal Grandmother        80s   Thrombosis Maternal Grandmother        fatal   Heart attack Paternal Grandfather     Social History:  reports that he has never smoked. He has quit using smokeless tobacco. He reports that he does not currently use alcohol. He reports that he does not currently use drugs.The patient is alone today.  Allergies:  Allergies  Allergen Reactions   Aspirin Other (See Comments)   Statins Other (See Comments)     Current Medications: Current Outpatient Medications  Medication Sig Dispense Refill   acetaminophen  (TYLENOL ) 500 MG tablet Take 500 mg by mouth every 4 (four) hours as needed for moderate pain.     amLODipine (NORVASC) 10 MG tablet Take 10 mg by mouth daily.     Ascorbic Acid (VITAMIN C) 1000 MG tablet Take 1,000 mg by mouth daily.     azelastine (ASTELIN) 0.1 % nasal spray Place 1 spray into both nostrils every 12 (twelve) hours.     Bempedoic Acid -Ezetimibe  (NEXLIZET ) 180-10 MG TABS Take 1 tablet by mouth daily. 90 tablet 3   benazepril  (LOTENSIN ) 20 MG tablet Take 1 tablet (20 mg total) by mouth daily. 90 tablet 3   CALCIUM  PO Take 1 tablet by mouth daily.     DULoxetine (CYMBALTA) 60 MG capsule Take 60 mg by mouth daily.     escitalopram (LEXAPRO) 20 MG tablet Take 20 mg by mouth daily.     folic acid  (FOLVITE ) 1 MG tablet TAKE ONE TABLET BY MOUTH ONCE DAILY 90 tablet 3   furosemide  (LASIX ) 40 MG tablet Take 1 tablet (40 mg total) by mouth daily. 90 tablet 2   metFORMIN (GLUCOPHAGE-XR) 500 MG 24 hr tablet Take 1,000 mg by mouth 2 (two) times daily.     metoprolol  succinate (TOPROL -XL) 100 MG 24 hr tablet Take 1 tablet (100 mg total) by mouth daily. Take with or immediately following a meal. 90 tablet 2   NON FORMULARY Take 700 mg by mouth daily. Apple pectin      omeprazole (PRILOSEC) 20 MG capsule Take 20 mg by mouth daily.     potassium chloride  SA (KLOR-CON  M) 20 MEQ tablet Take 2 tablets (40 mEq total) by mouth 3 (three) times daily. 180 tablet 5   predniSONE  (DELTASONE ) 5 MG tablet Take 1 tablet (5 mg total) by mouth daily with breakfast. 90 tablet 1   traZODone (DESYREL) 50 MG tablet Take 50 mg by mouth at bedtime as needed.     vitamin B-12 (CYANOCOBALAMIN) 1000 MCG tablet Take 1,000 mcg by mouth daily.     vitamin k 100 MCG tablet Take 100 mcg by mouth daily.     No current facility-administered medications for this visit.     ASSESSMENT & PLAN:  Assessment: 1.   Chronic ITP, which remains stable on prednisone  5 mg daily.  2.  Steroid induced diabetes, for which he continues metformin 500 mg twice daily.  He was able to get off of glipizide with diet and weight management.      3.  Elevated Epstein Barr titers.  This may be indicative of chronic fatigue syndrome, and I advised that he rest as needed.   4.  Sleep apnea.  He was referred to pulmonary.   5.  Chronic anxiety, mainly related to his health, and also to his job and insurance issues.  He is currently on Cymbalta 60 mg daily.  He continues to see our LCSW for counseling.  6.  Hospital admission with acute kidney injury due to dehydration associated with COVID-19, May 2022.   He was placed on an extremely slow prednisone  taper but we were unable to completely taper him off.     7.  Significant left hip and lower back pain due to osteoarthritis and degenerative disc disease.  He is following with a spine specialist.  He is undergoing physical therapy. MRI of the lumbar spine revealed degenerative changes of L4-5. His surgeon does not feel comfortable doing any invasive procedure or surgery with his persistent thrombocytopenia. Now he has bilateral shoulder pain with no clear explanation and may need further evaluation by an orthopedic surgeon.   8.  Elevated bilirubin which has persisted but remains stable. I suspect possible Gilbert's syndrome as his sister has the same trait but we will hold off on ordering [id number] as it would not change our plan of care.   9. Episodes of pre-syncope which are unexplained and could be cardiac or medication or hypoglycemia. In view of the possibility of a petit mal seizure or CNS hemorrhage, a brain MRI was done. It was then repeated with contrast. His MRI of the head in April, 2024 revealed abnormal size and shape of the ventricles with asymmetric enlargement of the left frontal horn. We don't know if this is congenital or whether it represents hydrocephalus.  A repeat  scan was suggested in 6 months, in October to see if there is any difference.  He was seen by neurosurgery, who planned repeat MRI head as recommended. He referred the patient to a neurologist, Dr Samara Crest to have an EEG done. He was prescribed Keppra for possible seizures but did not start that.  He had a EEG done.  He denies continued episodes of presyncope.    10. Long-term chronic prednisone  which puts him at risk for osteoporosis. His bone density scan was normal as of February, 2025.   Plan: He continues Prednisone  5 mg once daily. Mike [name] has a WBC of 7.0, hemoglobin of 14.8, and platelet count of 110,000 improved from 104,000. His CMP is normal other than a elevated BUN of 37 up from 30. He is currently taking Lasix  40 mg daily and states he drinks a 24 pack of water a day and 2 packs of soda. I informed him with taking Lasix  and caffeinated drinks he is prone to urinate more and lose more fluid. I advised him to increase his water intake instead of soda. He had a bone density scan done on 10/28/2023 which was completely normal. I recommended that if he is still on Prednisone , to have a repeat in 5 years. I will see him back in 3 months with CBC and CMP.  The patient understands the plans discussed today and is in agreement with them.  He knows to contact our office if he develops concerns prior to his next appointment.    I provided 16 minutes of face-to-face time during this encounter and > 50% was spent counseling as documented under my assessment and plan.   Nolia Baumgartner, MD  [facility] OF [facility] [address] [place] [address] Dept: [phone] Dept Fax: [phone]   No orders of the defined types were placed in this encounter.    I,Jasmine M Lassiter,acting as a scribe for Nolia Baumgartner, MD.,have documented all relevant documentation on the behalf of Nolia Baumgartner, MD,as directed by   Nolia Baumgartner, MD while in the presence of Nolia Baumgartner, MD.

## 2024-01-13 ENCOUNTER — Inpatient Hospital Stay: Payer: Medicare Other | Attending: Hematology and Oncology | Admitting: Oncology

## 2024-01-13 ENCOUNTER — Other Ambulatory Visit: Payer: Self-pay | Admitting: Oncology

## 2024-01-13 ENCOUNTER — Encounter: Payer: Self-pay | Admitting: Oncology

## 2024-01-13 ENCOUNTER — Telehealth: Payer: Self-pay | Admitting: Oncology

## 2024-01-13 ENCOUNTER — Inpatient Hospital Stay: Payer: Medicare Other

## 2024-01-13 VITALS — BP 116/70 | HR 64 | Temp 98.3°F | Resp 16 | Ht 71.0 in | Wt 188.6 lb

## 2024-01-13 DIAGNOSIS — R55 Syncope and collapse: Secondary | ICD-10-CM | POA: Diagnosis not present

## 2024-01-13 DIAGNOSIS — Z79899 Other long term (current) drug therapy: Secondary | ICD-10-CM | POA: Insufficient documentation

## 2024-01-13 DIAGNOSIS — D693 Immune thrombocytopenic purpura: Secondary | ICD-10-CM

## 2024-01-13 DIAGNOSIS — G479 Sleep disorder, unspecified: Secondary | ICD-10-CM | POA: Insufficient documentation

## 2024-01-13 DIAGNOSIS — F419 Anxiety disorder, unspecified: Secondary | ICD-10-CM | POA: Insufficient documentation

## 2024-01-13 DIAGNOSIS — Z803 Family history of malignant neoplasm of breast: Secondary | ICD-10-CM | POA: Diagnosis not present

## 2024-01-13 DIAGNOSIS — R7989 Other specified abnormal findings of blood chemistry: Secondary | ICD-10-CM | POA: Diagnosis not present

## 2024-01-13 DIAGNOSIS — M1612 Unilateral primary osteoarthritis, left hip: Secondary | ICD-10-CM | POA: Insufficient documentation

## 2024-01-13 DIAGNOSIS — G4730 Sleep apnea, unspecified: Secondary | ICD-10-CM | POA: Diagnosis not present

## 2024-01-13 DIAGNOSIS — M47816 Spondylosis without myelopathy or radiculopathy, lumbar region: Secondary | ICD-10-CM | POA: Diagnosis not present

## 2024-01-13 DIAGNOSIS — E099 Drug or chemical induced diabetes mellitus without complications: Secondary | ICD-10-CM | POA: Diagnosis not present

## 2024-01-13 DIAGNOSIS — Z7952 Long term (current) use of systemic steroids: Secondary | ICD-10-CM | POA: Insufficient documentation

## 2024-01-13 LAB — CBC WITH DIFFERENTIAL (CANCER CENTER ONLY)
Abs Immature Granulocytes: 0.03 10*3/uL (ref 0.00–0.07)
Basophils Absolute: 0.1 10*3/uL (ref 0.0–0.1)
Basophils Relative: 1 %
Eosinophils Absolute: 0.2 10*3/uL (ref 0.0–0.5)
Eosinophils Relative: 3 %
HCT: 42.7 % (ref 39.0–52.0)
Hemoglobin: 14.8 g/dL (ref 13.0–17.0)
Immature Granulocytes: 0 %
Lymphocytes Relative: 35 %
Lymphs Abs: 2.4 10*3/uL (ref 0.7–4.0)
MCH: 30 pg (ref 26.0–34.0)
MCHC: 34.7 g/dL (ref 30.0–36.0)
MCV: 86.6 fL (ref 80.0–100.0)
Monocytes Absolute: 0.5 10*3/uL (ref 0.1–1.0)
Monocytes Relative: 7 %
Neutro Abs: 3.7 10*3/uL (ref 1.7–7.7)
Neutrophils Relative %: 54 %
Platelet Count: 110 10*3/uL — ABNORMAL LOW (ref 150–400)
RBC: 4.93 MIL/uL (ref 4.22–5.81)
RDW: 12.2 % (ref 11.5–15.5)
WBC Count: 7 10*3/uL (ref 4.0–10.5)
nRBC: 0 % (ref 0.0–0.2)

## 2024-01-13 LAB — CMP (CANCER CENTER ONLY)
ALT: 30 U/L (ref 0–44)
AST: 29 U/L (ref 15–41)
Albumin: 4.6 g/dL (ref 3.5–5.0)
Alkaline Phosphatase: 67 U/L (ref 38–126)
Anion gap: 13 (ref 5–15)
BUN: 37 mg/dL — ABNORMAL HIGH (ref 6–20)
CO2: 27 mmol/L (ref 22–32)
Calcium: 9.9 mg/dL (ref 8.9–10.3)
Chloride: 102 mmol/L (ref 98–111)
Creatinine: 1.16 mg/dL (ref 0.61–1.24)
GFR, Estimated: >60 mL/min (ref >60)
Glucose, Bld: 105 mg/dL — ABNORMAL HIGH (ref 70–99)
Potassium: 3.9 mmol/L (ref 3.5–5.1)
Sodium: 142 mmol/L (ref 135–145)
Total Bilirubin: 0.7 mg/dL (ref 0.0–1.2)
Total Protein: 7.5 g/dL (ref 6.5–8.1)

## 2024-01-13 NOTE — Telephone Encounter (Signed)
 Patient has been scheduled for follow-up visit per 01/11/24 LOS.  Pt given an appt calendar with date and time.

## 2024-03-03 ENCOUNTER — Other Ambulatory Visit: Payer: Self-pay | Admitting: Hematology and Oncology

## 2024-03-03 ENCOUNTER — Telehealth: Payer: Self-pay | Admitting: Cardiology

## 2024-03-03 ENCOUNTER — Other Ambulatory Visit: Payer: Self-pay | Admitting: Cardiology

## 2024-03-03 DIAGNOSIS — I10 Essential (primary) hypertension: Secondary | ICD-10-CM

## 2024-03-03 DIAGNOSIS — D693 Immune thrombocytopenic purpura: Secondary | ICD-10-CM

## 2024-03-03 NOTE — Telephone Encounter (Signed)
 New Message:      Patient says he would like for his lab order be [facility] to [facility] in [place] by before 04-19-24. He said that way he can have both of his work, did at the same time.

## 2024-03-05 ENCOUNTER — Other Ambulatory Visit (HOSPITAL_COMMUNITY): Payer: Self-pay

## 2024-03-15 NOTE — Telephone Encounter (Signed)
 Message sent to Dr. Marcello at cancer center to see if she would be willing to order lipid panel, CMP and Uric acid.

## 2024-03-17 ENCOUNTER — Other Ambulatory Visit: Payer: Self-pay | Admitting: Oncology

## 2024-03-17 DIAGNOSIS — E782 Mixed hyperlipidemia: Secondary | ICD-10-CM

## 2024-04-12 NOTE — Progress Notes (Signed)
 [facility]  [address] [place],  [place]  [address] [phone]  Clinic Day: 04/19/24  Referring physician: Benjamine Lauraine DASEN, NP  CHIEF COMPLAINT:  CC: Chronic ITP  Current Treatment: Prednisone 5 mg daily  HISTORY OF PRESENT ILLNESS:  Mike [name] is a 53 y.o. male with a history of severe thrombocytopenia consistent with immune thrombocytopenic purpura diagnosed in May 2021.  Further laboratory evaluation did not reveal any other specific etiology for his thrombocytopenia.  He had mildly elevated liver transaminases.  Abdominal ultrasound revealed increased echogenicity of hepatic parenchyma suggesting hepatic steatosis, with probable fatty sparing adjacent to gallbladder fossa.  No other abnormality was seen.  We recommended bone marrow examination, but the patient and his mother declined.  The platelet count continued to fluctuate up and down, but as his platelet count remained 25,000 in June 2021, he was started on prednisone 20 mg 3 times daily, with a good response.  He has had leukocytosis secondary to the high-dose prednisone.  He also has had oral candidiasis on occasion. Elbert Shine titers were elevated, so this may be representative of chronic fatigue syndrome. We were unable to taper him off the prednisone, so he was treated with rituximab weekly for 4 weeks in October and November 2021.  We slowly tapered the prednisone after that.  He had complications of significant weight gain, hypertension, steroid induced diabetes and steroid induced myopathy. He has also had hypokalemia resolved with oral supplementation.     On May 18th, 2022 his platelet count was 151,000, so prednisone was decreased to 5 mg daily. He was admitted with COVID-19 on May 24th after leaving work earlier due to altered mental status and generalized weakness. He had acute kidney injury with a creatinine of 2.2, which normalized with IV hydration. He had fever, but no respiratory symptoms. He was  discharged on a prolonged prednisone taper over 15 weeks.  He had been having some chest pain and dyspnea since discontinuing indapamide, so was referred to Dr. Edwyna in Cardiology. He placed him on furosemide 40 mg daily and metoprolol 50 mg daily.  He had had problems with hypokalemia for a long time, but this worsened with the diuretic, so potassium was increased to 40 mg twice daily.  The lower extremity edema has resolved with the furosemide.  ECHO revealed left ventricular hypertrophy with an EF between 55-60%, and the CT of the coronary arteries looks good.  He was placed on gabapentin 300 mg BID for neuropathy.  He was on a extremely slow taper of the prednisone, but his platelets dropped to 73,000 when the dose was decreased to 5 mg three times per wee, so we resumed 5 mg daily.  Gabapentin was discontinued as it wasn't effective.  MRI lumbar spine in October 2022 revealed degenerative changes of the lumbar spine most prominent L4-5.  He was recently referred to spinal specialist.  He was hesitant to undergo epidural injection due to thrombocytopenia, although his platelets were usually above 75,000.  He has remained on prednisone 5 mg daily and his platelet count has remained above 75,000 since October 2022.  We began to space out his visits over time.  He has had mild elevation of the bilirubin, felt to be most likely Gilbert's syndrome.  Testing was not done. He has been able to lose weight over the years.  He has struggled with chronic anxiety and was previously seen by psychiatry.  He began seeing our licensed clinical social worker in August  2023 for counseling.   He reported presyncopal episodes so underwent MRI head in March 2024.  He initially declined contrast, but there were some abnormalities, so he underwent MRI head with contrast in April which revealed stable size and shape of the ventricular system from March with asymmetric enlargement of the left frontal horn. No contrast-enhancing  lesion is visualized to suggest an obstructive cause for ventricular configuration. There were few vessels that course along the region of the foramen of Monro. Overall, this was a nonspecific finding and may be congenital. Follow up brain MRI with a CISS sequence is recommended in 6 months to assess for stability and exclude an underlying lesion. Additionally, earlier follow up was recommended if there is an acute change in patient's symptoms (worsening headaches or new neurologic symptoms) to exclude the possibility of hydrocephalus.  We referred him to neurosurgery who subsequently referred him to neurology.  INTERVAL HISTORY:  Mike [name] is here today for repeat clinical assessment for his chronic ITP.  He denies abnormal bleeding or bruising. Patient states that he feels ok but complains of occasional dizzy spells, especially when he stands up. His BP today is low at 109/61, repeat standing was 105/68, so he does not have orthostatic hypotension. He informed me that he has an appointment at his cardiologist office tomorrow to see Delon Hoover and I instructed him to discuss his dizzy spells with her. Patient is currently on 3 different BP medications and continues Prednisone  5 mg once daily as well as Metformin  2,000 mg BID. He has a WBC of 6.2, hemoglobin of 14.5, and a low platelet count of 93,000 down from 110,000. His CMP is normal other than an elevated BUN of 30. His uric acid, A1c, and lipid panel today are pending. He asked me about whether he would have to stay on the Prednisone  long term, and I said most likely, since I would not want to taper it when he has had a decrease in the platelet count. There are other approaches with treating his ITP but most of them would have a great deal more toxicity and cost. I will see him back in 3 months with CBC and CMP.   He denies fever, chills, night sweats, or other signs of infection. He denies cardiorespiratory and gastrointestinal issues. He  denies pain.  His appetite is fine and His weight has decreased 2 pounds over last 3 months.   REVIEW OF SYSTEMS:  Review of Systems  Constitutional:  Negative for appetite change, chills, diaphoresis, fatigue, fever and unexpected weight change.  HENT:  Negative.  Negative for hearing loss, lump/mass, mouth sores, nosebleeds, sore throat, tinnitus, trouble swallowing and voice change.   Eyes: Negative.  Negative for eye problems and icterus.  Respiratory: Negative.  Negative for chest tightness, cough, hemoptysis, shortness of breath and wheezing.   Cardiovascular: Negative.  Negative for chest pain, leg swelling and palpitations.  Gastrointestinal: Negative.  Negative for abdominal distention, abdominal pain, blood in stool, constipation, diarrhea, nausea, rectal pain and vomiting.  Endocrine: Negative.  Negative for hot flashes.  Genitourinary: Negative.  Negative for bladder incontinence, difficulty urinating, dyspareunia, dysuria, frequency, hematuria, nocturia, pelvic pain and penile discharge.   Musculoskeletal:  Positive for back pain (Lower back) and gait problem (difficulty ambulating). Negative for arthralgias, flank pain, myalgias, neck pain and neck stiffness.       Aching/sharp intermittent bilateral shoulder pain  Skin: Negative.  Negative for itching, rash and wound.  Neurological:  Positive for dizziness, extremity weakness, gait  problem (difficulty ambulating) and numbness (Bilateral hands). Negative for headaches, light-headedness, seizures and speech difficulty.  Hematological: Negative.  Negative for adenopathy. Does not bruise/bleed easily.  Psychiatric/Behavioral:  Positive for sleep disturbance. Negative for confusion, decreased concentration, depression and suicidal ideas. The patient is nervous/anxious.      VITALS:  Blood pressure 105/68, pulse 68, temperature 97.9 F (36.6 C), temperature source Oral, resp. rate 18, height 5' 11 (1.803 m), weight 186 lb 9.6 oz (84.6 kg), SpO2  100%.  Wt Readings from Last 3 Encounters:  04/20/24 188 lb (85.3 kg)  04/19/24 186 lb 9.6 oz (84.6 kg)  01/13/24 188 lb 9.6 oz (85.5 kg)    Body mass index is 26.03 kg/m.  Performance status (ECOG): 1 - Symptomatic but completely ambulatory  PHYSICAL EXAM:  Physical Exam Vitals and nursing note reviewed.  Constitutional:      General: He is not in acute distress.    Appearance: Normal appearance. He is normal weight. He is not ill-appearing, toxic-appearing or diaphoretic.  HENT:     Head: Normocephalic and atraumatic.     Right Ear: Tympanic membrane, ear canal and external ear normal. There is no impacted cerumen.     Left Ear: Tympanic membrane, ear canal and external ear normal. There is no impacted cerumen.     Nose: Nose normal. No congestion or rhinorrhea.     Mouth/Throat:     Mouth: Mucous membranes are moist.     Pharynx: Oropharynx is clear. No oropharyngeal exudate or posterior oropharyngeal erythema.  Eyes:     General: No scleral icterus.       Right eye: No discharge.        Left eye: No discharge.     Extraocular Movements: Extraocular movements intact.     Conjunctiva/sclera: Conjunctivae normal.     Pupils: Pupils are equal, round, and reactive to light.  Neck:     Vascular: No carotid bruit.  Cardiovascular:     Rate and Rhythm: Normal rate and regular rhythm.     Pulses: Normal pulses.     Heart sounds: Normal heart sounds. No murmur heard.    No friction rub. No gallop.  Pulmonary:     Effort: Pulmonary effort is normal. No respiratory distress.     Breath sounds: Normal breath sounds. No stridor. No wheezing, rhonchi or rales.  Chest:     Chest wall: No tenderness.  Abdominal:     General: Bowel sounds are normal. There is no distension.     Palpations: Abdomen is soft. There is no hepatomegaly, splenomegaly or mass.     Tenderness: There is no abdominal tenderness. There is no right CVA tenderness, left CVA tenderness, guarding or rebound.      Hernia: A hernia is present.     Comments: Hernia just above the umbilicus   Musculoskeletal:        General: No swelling, tenderness, deformity or signs of injury. Normal range of motion.     Cervical back: Normal range of motion and neck supple. No rigidity or tenderness.     Right lower leg: No edema.     Left lower leg: No edema.  Lymphadenopathy:     Cervical: No cervical adenopathy.     Upper Body:     Right upper body: No supraclavicular or axillary adenopathy.     Left upper body: No supraclavicular or axillary adenopathy.     Lower Body: No right inguinal adenopathy. No left inguinal adenopathy.  Skin:  General: Skin is warm and dry.     Coloration: Skin is not jaundiced or pale.     Findings: No bruising, erythema, lesion or rash.  Neurological:     General: No focal deficit present.     Mental Status: He is alert and oriented to person, place, and time. Mental status is at baseline.     Cranial Nerves: No cranial nerve deficit.     Sensory: No sensory deficit.     Motor: No weakness.     Coordination: Coordination normal.     Gait: Gait normal.     Deep Tendon Reflexes: Reflexes normal.  Psychiatric:        Mood and Affect: Mood normal.        Behavior: Behavior normal.        Thought Content: Thought content normal.        Judgment: Judgment normal.    LABS:      Latest Ref Rng & Units 04/19/2024    8:38 AM 01/13/2024    8:00 AM 10/16/2023    8:07 AM  CBC  WBC 4.0 - 10.5 K/uL 6.2  7.0  7.1   Hemoglobin 13.0 - 17.0 g/dL 85.4  85.1  84.4   Hematocrit 39.0 - 52.0 % 42.7  42.7  44.9   Platelets 150 - 400 K/uL 93  110  104       Latest Ref Rng & Units 04/19/2024    8:38 AM 01/13/2024    8:00 AM 10/16/2023    8:07 AM  CMP  Glucose 70 - 99 mg/dL 895  894  886   BUN 6 - 20 mg/dL 30  37  30   Creatinine 0.61 - 1.24 mg/dL 8.83  8.83  8.90   Sodium 135 - 145 mmol/L 140  142  140   Potassium 3.5 - 5.1 mmol/L 4.1  3.9  4.2   Chloride 98 - 111 mmol/L 101  102  98    CO2 22 - 32 mmol/L 27  27  31    Calcium 8.9 - 10.3 mg/dL 9.5  9.9  89.8   Total Protein 6.5 - 8.1 g/dL 7.3  7.5  7.7   Total Bilirubin 0.0 - 1.2 mg/dL 1.0  0.7  0.8   Alkaline Phos 38 - 126 U/L 57  67  74   AST 15 - 41 U/L 29  29  28    ALT 0 - 44 U/L 35  30  32    Lab Results  Component Value Date   TSH 2.150 04/20/2024   T4TOTAL 10.4 04/20/2024   STUDIES:  EXAM: 10/28/2023 DUAL X-RAY ABSORPTIOMETRY (DXA) FOR BONE MINERAL DENSITY  AP Spine L1-L4 (L3) 10/28/2023 53.0 Normal 4.0 1.646 g/cm2 DualFemur Neck Right 10/28/2023 53.0 Normal 1.4 1.238 g/cm2 DualFemur Total Mean 10/28/2023 53.0 Normal 1.6 1.211 g/cm2  HISTORY:   Past Medical History:  Diagnosis Date   Acute kidney injury (HCC)    Allergic rhinitis with postnasal drip    Allergy    Anemia    CAD (coronary artery disease) 05/17/2021   Complex partial seizures (HCC) 10/15/2023   Congenital cerebral ventriculomegaly (HCC) 10/15/2023   Diuretic-induced hypokalemia 05/02/2021   Dyspnea on exertion    Folate deficiency anemia 05/02/2021   GERD (gastroesophageal reflux disease)    GERD without esophagitis    Hyperbilirubinemia 08/03/2019   Hypertension    Hyponatremia    Immune thrombocytopenic purpura (HCC)    Immune thrombocytopenic purpura (HCC)    Metabolic  syndrome    Mixed dyslipidemia 05/17/2021   Morbid obesity (HCC)    Obesity (BMI 30.0-34.9) 04/18/2021   Obesity (BMI 35.0-39.9 without comorbidity) 04/18/2021   Obstructive sleep apnea hypopnea, moderate    Post-COVID syndrome    Shortness of breath    Sleep apnea    wears a mouth piece   Steroid-induced diabetes (HCC)    Stress and adjustment reaction    Syncope 10/30/2022   Thrombocytopenia, unspecified (HCC)     Past Surgical History:  Procedure Laterality Date   NO PAST SURGERIES      Family History  Problem Relation Age of Onset   Heart attack Father    Thrombosis Maternal Aunt        fatal   Thrombosis Maternal Uncle        fatal    Heart attack Paternal Uncle    Breast cancer Maternal Grandmother        80s   Thrombosis Maternal Grandmother        fatal   Heart attack Paternal Grandfather     Social History:  reports that he has never smoked. He has quit using smokeless tobacco. He reports that he does not currently use alcohol. He reports that he does not currently use drugs.The patient is alone today.  Allergies:  Allergies  Allergen Reactions   Aspirin Other (See Comments)   Statins Other (See Comments)    Current Medications: Current Outpatient Medications  Medication Sig Dispense Refill   acetaminophen (TYLENOL) 500 MG tablet Take 500 mg by mouth every 4 (four) hours as needed for moderate pain.     Ascorbic Acid (VITAMIN C) 1000 MG tablet Take 1,000 mg by mouth daily.     Bempedoic Acid-Ezetimibe (NEXLIZET) 180-10 MG TABS Take 1 tablet by mouth daily. 90 tablet 3   folic acid (FOLVITE) 1 MG tablet TAKE ONE TABLET BY MOUTH ONCE DAILY 90 tablet 3   furosemide (LASIX) 40 MG tablet Take 1 tablet (40 mg total) by mouth daily. 90 tablet 0   metoprolol succinate (TOPROL-XL) 100 MG 24 hr tablet Take 1 tablet (100 mg total) by mouth daily. Take with or immediately following a meal. 90 tablet 2   NON FORMULARY Currently taking creatine and protein supplement daily     omeprazole (PRILOSEC) 20 MG capsule Take 20 mg by mouth daily.     potassium chloride SA (KLOR-CON M) 20 MEQ tablet Take 2 tablets (40 mEq total) by mouth 3 (three) times daily. 180 tablet 5   predniSONE (DELTASONE) 5 MG tablet Take 1 tablet (5 mg total) by mouth daily with breakfast. 90 tablet 1   traZODone (DESYREL) 100 MG tablet Take 100 mg by mouth at bedtime as needed.     vitamin B-12 (CYANOCOBALAMIN) 1000 MCG tablet Take 1,000 mcg by mouth daily.     vitamin k 100 MCG tablet Take 100 mcg by mouth daily.     benazepril (LOTENSIN) 10 MG tablet Take 1 tablet (10 mg total) by mouth daily. 90 tablet 3   ivabradine (CORLANOR) 7.5 MG TABS tablet Take  15 mg (2 tablets)by mouth two hours prior to your CT scan. 2 tablet 0   metFORMIN (GLUCOPHAGE-XR) 500 MG 24 hr tablet Take 1 tablet (500 mg total) by mouth in the morning, at noon, in the evening, and at bedtime.     No current facility-administered medications for this visit.     ASSESSMENT & PLAN:  Assessment: 1.  Chronic ITP, which remains stable on  prednisone 5 mg daily.  2.  Steroid induced diabetes, for which he continues metformin 2,000 mg twice daily.  He was able to get off of glipizide with diet and weight management.      3.  Elevated Epstein Barr titers.  This may be indicative of chronic fatigue syndrome, and I advised that he rest as needed.   4.  Sleep apnea.  He was referred to pulmonary.   5.  Chronic anxiety, mainly related to his health, and also to his job and insurance issues.  He is currently on Cymbalta 60 mg daily.  He has seen our LCSW for counseling.  6.  Hospital admission with acute kidney injury due to dehydration associated with COVID-19, May 2022.   He was placed on an extremely slow prednisone taper but we were unable to completely taper him off.     7.  Significant left hip and lower back pain due to osteoarthritis and degenerative disc disease.  He is following with a spine specialist.  He is undergoing physical therapy. MRI of the lumbar spine revealed degenerative changes of L4-5. His surgeon does not feel comfortable doing any invasive procedure or surgery with his persistent thrombocytopenia. Now he has bilateral shoulder pain with no clear explanation and may need further evaluation by an orthopedic surgeon.   8.  Elevated bilirubin which has persisted but remains stable. I suspect possible Gilbert's syndrome as his sister has the same trait but we will hold off on ordering [id number] as it would not change our plan of care.   9. Episodes of pre-syncope which are unexplained and could be cardiac or medication or hypoglycemia. In view of the possibility of a  petit mal seizure or CNS hemorrhage, a brain MRI was done. It was then repeated with contrast. His MRI of the head in April, 2024 revealed abnormal size and shape of the ventricles with asymmetric enlargement of the left frontal horn. We don't know if this is congenital or whether it represents hydrocephalus.  A repeat scan was suggested in 6 months, in October to see if there is any difference.  He was seen by neurosurgery, who planned repeat MRI head as recommended. He referred the patient to a neurologist, Dr Gregg to have an EEG done, which was reportedly normal.  He denies continued episodes of presyncope but does complains of dizzy spells especially when he moves quickly.    10. Long-term chronic prednisone which puts him at risk for osteoporosis. His bone density scan was normal as of February, 2025.   Plan: His BP today is low at 109/61, repeat standing was 105/68, so he does not have orthostatic hypotension. He informed me that he has an appointment at his cardiologist office tomorrow to see Delon Hoover and I instructed him to discuss his dizzy spells with her. Patient is currently on 3 different BP medications and continues Prednisone 5 mg once daily as well as Metformin 2,000 mg BID. He has a WBC of 6.2, hemoglobin of 14.5, and a low platelet count of 93,000 down from 110,000. His CMP is normal other than an elevated BUN of 30. His uric acid, A1c, and lipid panel today are pending.  He asked me about whether he would have to stay on the Prednisone long term, and I said most likely, since I would not want to taper it when he has had a decrease in the platelet count. There are other approaches with treating his ITP but most of them would have a  great deal more toxicity and cost. I will see him back in 3 months with CBC and CMP. The patient understands the plans discussed today and is in agreement with them.  He knows to contact our office if he develops concerns prior to his next appointment.     I provided 14 minutes of face-to-face time during this encounter and > 50% was spent counseling as documented under my assessment and plan.   Wanda VEAR Cornish, MD  [facility] - A DEPT OF [facility] [address] [place] [address] Dept: [phone] Dept Fax: [phone]   No orders of the defined types were placed in this encounter.    I,Jasmine M Lassiter,acting as a scribe for Wanda VEAR Cornish, MD.,have documented all relevant documentation on the behalf of Wanda VEAR Cornish, MD,as directed by  Wanda VEAR Cornish, MD while in the presence of Wanda VEAR Cornish, MD.

## 2024-04-15 ENCOUNTER — Telehealth: Payer: Self-pay | Admitting: Cardiology

## 2024-04-15 ENCOUNTER — Other Ambulatory Visit: Payer: Self-pay | Admitting: Oncology

## 2024-04-15 DIAGNOSIS — E099 Drug or chemical induced diabetes mellitus without complications: Secondary | ICD-10-CM

## 2024-04-15 NOTE — Telephone Encounter (Signed)
 Sister Sherrye) wants patients lab orders sent to Dr. Cornelius to have blood drawn at appointment on 8/19 before 8:30 am.

## 2024-04-18 NOTE — Telephone Encounter (Signed)
 Sister called wanting to be sure labs had been sent to Dr. Cornelius to be drawn. Looks like A lipid and CMP was sent by Delon Hoover, NP

## 2024-04-19 ENCOUNTER — Inpatient Hospital Stay

## 2024-04-19 ENCOUNTER — Other Ambulatory Visit: Payer: Self-pay | Admitting: Oncology

## 2024-04-19 ENCOUNTER — Encounter: Payer: Self-pay | Admitting: Oncology

## 2024-04-19 ENCOUNTER — Telehealth: Payer: Self-pay | Admitting: Oncology

## 2024-04-19 ENCOUNTER — Inpatient Hospital Stay: Attending: Hematology and Oncology | Admitting: Oncology

## 2024-04-19 ENCOUNTER — Other Ambulatory Visit: Payer: Self-pay

## 2024-04-19 VITALS — BP 105/68 | HR 68 | Temp 97.9°F | Resp 18 | Ht 71.0 in | Wt 186.6 lb

## 2024-04-19 DIAGNOSIS — E099 Drug or chemical induced diabetes mellitus without complications: Secondary | ICD-10-CM | POA: Diagnosis not present

## 2024-04-19 DIAGNOSIS — D693 Immune thrombocytopenic purpura: Secondary | ICD-10-CM

## 2024-04-19 DIAGNOSIS — F419 Anxiety disorder, unspecified: Secondary | ICD-10-CM | POA: Insufficient documentation

## 2024-04-19 DIAGNOSIS — M25511 Pain in right shoulder: Secondary | ICD-10-CM | POA: Diagnosis not present

## 2024-04-19 DIAGNOSIS — Z7952 Long term (current) use of systemic steroids: Secondary | ICD-10-CM | POA: Diagnosis not present

## 2024-04-19 DIAGNOSIS — M25512 Pain in left shoulder: Secondary | ICD-10-CM | POA: Diagnosis not present

## 2024-04-19 DIAGNOSIS — M1612 Unilateral primary osteoarthritis, left hip: Secondary | ICD-10-CM | POA: Insufficient documentation

## 2024-04-19 DIAGNOSIS — Z803 Family history of malignant neoplasm of breast: Secondary | ICD-10-CM | POA: Insufficient documentation

## 2024-04-19 DIAGNOSIS — Z79899 Other long term (current) drug therapy: Secondary | ICD-10-CM | POA: Diagnosis not present

## 2024-04-19 DIAGNOSIS — R7989 Other specified abnormal findings of blood chemistry: Secondary | ICD-10-CM | POA: Diagnosis not present

## 2024-04-19 DIAGNOSIS — E782 Mixed hyperlipidemia: Secondary | ICD-10-CM

## 2024-04-19 DIAGNOSIS — R55 Syncope and collapse: Secondary | ICD-10-CM | POA: Diagnosis not present

## 2024-04-19 DIAGNOSIS — Z7984 Long term (current) use of oral hypoglycemic drugs: Secondary | ICD-10-CM | POA: Diagnosis not present

## 2024-04-19 LAB — CBC WITH DIFFERENTIAL (CANCER CENTER ONLY)
Abs Immature Granulocytes: 0.02 K/uL (ref 0.00–0.07)
Basophils Absolute: 0.1 K/uL (ref 0.0–0.1)
Basophils Relative: 1 %
Eosinophils Absolute: 0.2 K/uL (ref 0.0–0.5)
Eosinophils Relative: 3 %
HCT: 42.7 % (ref 39.0–52.0)
Hemoglobin: 14.5 g/dL (ref 13.0–17.0)
Immature Granulocytes: 0 %
Lymphocytes Relative: 43 %
Lymphs Abs: 2.7 K/uL (ref 0.7–4.0)
MCH: 29.4 pg (ref 26.0–34.0)
MCHC: 34 g/dL (ref 30.0–36.0)
MCV: 86.4 fL (ref 80.0–100.0)
Monocytes Absolute: 0.5 K/uL (ref 0.1–1.0)
Monocytes Relative: 7 %
Neutro Abs: 2.8 K/uL (ref 1.7–7.7)
Neutrophils Relative %: 46 %
Platelet Count: 93 K/uL — ABNORMAL LOW (ref 150–400)
RBC: 4.94 MIL/uL (ref 4.22–5.81)
RDW: 12.3 % (ref 11.5–15.5)
WBC Count: 6.2 K/uL (ref 4.0–10.5)
nRBC: 0 % (ref 0.0–0.2)

## 2024-04-19 LAB — CMP (CANCER CENTER ONLY)
ALT: 35 U/L (ref 0–44)
AST: 29 U/L (ref 15–41)
Albumin: 4.7 g/dL (ref 3.5–5.0)
Alkaline Phosphatase: 57 U/L (ref 38–126)
Anion gap: 12 (ref 5–15)
BUN: 30 mg/dL — ABNORMAL HIGH (ref 6–20)
CO2: 27 mmol/L (ref 22–32)
Calcium: 9.5 mg/dL (ref 8.9–10.3)
Chloride: 101 mmol/L (ref 98–111)
Creatinine: 1.16 mg/dL (ref 0.61–1.24)
GFR, Estimated: >60 mL/min (ref >60)
Glucose, Bld: 104 mg/dL — ABNORMAL HIGH (ref 70–99)
Potassium: 4.1 mmol/L (ref 3.5–5.1)
Sodium: 140 mmol/L (ref 135–145)
Total Bilirubin: 1 mg/dL (ref 0.0–1.2)
Total Protein: 7.3 g/dL (ref 6.5–8.1)

## 2024-04-19 LAB — HEMOGLOBIN A1C
Hgb A1c MFr Bld: 5.4 % (ref 4.8–5.6)
Mean Plasma Glucose: 108.28 mg/dL

## 2024-04-19 LAB — URIC ACID: Uric Acid, Serum: 5.5 mg/dL (ref 3.7–8.6)

## 2024-04-19 LAB — LIPID PANEL
Cholesterol: 97 mg/dL (ref 0–200)
HDL: 45 mg/dL (ref >40)
LDL Cholesterol: 41 mg/dL (ref 0–99)
Total CHOL/HDL Ratio: 2.2 ratio
Triglycerides: 53 mg/dL (ref <150)
VLDL: 11 mg/dL (ref 0–40)

## 2024-04-19 NOTE — Telephone Encounter (Signed)
 Patient has been scheduled for follow-up visit per 04/19/24 LOS.  Pt given an appt calendar with date and time.

## 2024-04-19 NOTE — Progress Notes (Addendum)
 Cardiology Office Note:  .   Date:  04/19/2024  ID:  Mike [name], DOB Feb 07, 1971, MRN [id number] PCP: Benjamine Lauraine DASEN, NP  [facility] Providers Cardiologist:  Jereme Loren JONELLE Crape, MD    History of Present Illness: Mike   Mike [name] is a 53 y.o. male with a past medical history of nonobstructive CAD coronary CTA in 2022, hypertension, OSA, GERD, thrombocytopenia, dyslipidemia with statin intolerance.   03/14/2021 coronary CTA calcium  score of 296, 96 percentile, minimal plaque in the LM and LAD 03/12/2021 echo EF 55 to 60%, mild concentric LVH  He initially established care with Dr. Crape in 2022 for the management of his hypertension. He had a coronary CTA in 2022 revealing a calcium  score of 296, 96th percentile.  Evaluated by Dr. Crape on 12/10/2022, he was stable from a cardiac perspective and advised to follow-up in 6 months.  Most recently he was evaluated by myself on 10/16/2023, stable from a cardiac perspective, participated in running and exercise, some concerns with his next visit and so ultimately he was referred to [facility].D. for management of his lipids, but he decision was made to continue Nexlizet .     ROS: Review of Systems  Musculoskeletal:  Positive for joint pain.  All other systems reviewed and are negative.    Studies Reviewed: .        Cardiac Studies & Procedures   ______________________________________________________________________________________________     ECHOCARDIOGRAM  ECHOCARDIOGRAM COMPLETE 03/12/2021  Narrative ECHOCARDIOGRAM REPORT    Patient Name:   Mike [name] Date of Exam: 03/12/2021 Medical Rec #:  [id number]        Height:       71.0 in Accession #:    [id number]       Weight:       270.0 lb Date of Birth:  10-01-1970        BSA:          2.395 m Patient Age:    50 years         BP:           114/68 mmHg Patient Gender: M                HR:           73 bpm. Exam Location:  [place]  Procedure: 2D  Echo  Indications:    Dyspnea R06.00  History:        Patient has no prior history of Echocardiogram examinations. Risk Factors:Hypertension.  Sonographer:    Lynwood Silvas Referring Phys: CYRUS Zuri Bradway JONELLE [facility]  IMPRESSIONS   1. Left ventricular ejection fraction, by estimation, is 55 to 60%. The left ventricle has normal function. The left ventricle has no regional wall motion abnormalities. There is mild concentric left ventricular hypertrophy. Left ventricular diastolic parameters were normal. 2. Right ventricular systolic function is normal. The right ventricular size is normal. There is normal pulmonary artery systolic pressure. 3. The mitral valve is normal in structure. No evidence of mitral valve regurgitation. No evidence of mitral stenosis. 4. The aortic valve has an indeterminant number of cusps. Aortic valve regurgitation is not visualized. No aortic stenosis is present. 5. The inferior vena cava is normal in size with greater than 50% respiratory variability, suggesting right atrial pressure of 3 mmHg.  FINDINGS Left Ventricle: Left ventricular ejection fraction, by estimation, is 55 to 60%. The left ventricle has normal function. The left ventricle has no regional wall motion abnormalities. The left ventricular  internal cavity size was normal in size. There is mild concentric left ventricular hypertrophy. Left ventricular diastolic parameters were normal. Normal left ventricular filling pressure.  Right Ventricle: The right ventricular size is normal. No increase in right ventricular wall thickness. Right ventricular systolic function is normal. There is normal pulmonary artery systolic pressure. The tricuspid regurgitant velocity is 2.23 m/s, and with an assumed right atrial pressure of 3 mmHg, the estimated right ventricular systolic pressure is 22.9 mmHg.  Left Atrium: Left atrial size was normal in size.  Right Atrium: Right atrial size was normal in size.  Pericardium:  There is no evidence of pericardial effusion.  Mitral Valve: The mitral valve is normal in structure. No evidence of mitral valve regurgitation. No evidence of mitral valve stenosis.  Tricuspid Valve: The tricuspid valve is normal in structure. Tricuspid valve regurgitation is trivial. No evidence of tricuspid stenosis.  Aortic Valve: The aortic valve has an indeterminant number of cusps. Aortic valve regurgitation is not visualized. No aortic stenosis is present.  Pulmonic Valve: The pulmonic valve was normal in structure. Pulmonic valve regurgitation is not visualized. No evidence of pulmonic stenosis.  Aorta: The aortic root and ascending aorta are structurally normal, with no evidence of dilitation and the aortic arch was not well visualized.  Venous: A normal flow pattern is recorded from the right upper pulmonary vein. The inferior vena cava is normal in size with greater than 50% respiratory variability, suggesting right atrial pressure of 3 mmHg.  IAS/Shunts: No atrial level shunt detected by color flow Doppler.   LEFT VENTRICLE PLAX 2D LVIDd:         5.70 cm  Diastology LVIDs:         3.70 cm  LV e' medial:    7.18 cm/s LV PW:         1.10 cm  LV E/e' medial:  16.7 LV IVS:        1.10 cm  LV e' lateral:   7.72 cm/s LVOT diam:     2.00 cm  LV E/e' lateral: 15.5 LV SV:         57 LV SV Index:   24 LVOT Area:     3.14 cm   RIGHT VENTRICLE             IVC RV S prime:     17.20 cm/s  IVC diam: 1.60 cm TAPSE (M-mode): 2.6 cm  LEFT ATRIUM             Index       RIGHT ATRIUM           Index LA diam:        4.20 cm 1.75 cm/m  RA Area:     15.90 cm LA Vol (A2C):   46.8 ml 19.54 ml/m RA Volume:   43.10 ml  17.99 ml/m LA Vol (A4C):   56.0 ml 23.38 ml/m LA Biplane Vol: 52.7 ml 22.00 ml/m AORTIC VALVE LVOT Vmax:   91.70 cm/s LVOT Vmean:  59.900 cm/s LVOT VTI:    0.180 m  AORTA Ao Root diam: 2.80 cm Ao Asc diam:  2.80 cm Ao Desc diam: 1.80 cm  MITRAL VALVE                 TRICUSPID VALVE MV Area (PHT): 3.65 cm     TR Peak grad:   19.9 mmHg MV Decel Time: 208 msec     TR Vmax:        223.00  cm/s MV E velocity: 120.00 cm/s MV A velocity: 92.00 cm/s   SHUNTS MV E/A ratio:  1.30         Systemic VTI:  0.18 m Systemic Diam: 2.00 cm  Redell Leiter MD Electronically signed by Redell Leiter MD Signature Date/Time: 03/12/2021/4:31:55 PM    Final      CT SCANS  CT CORONARY MORPH W/CTA COR W/SCORE 03/14/2021  Addendum 03/14/2021  1:54 PM ADDENDUM REPORT: 03/14/2021 13:52  CLINICAL DATA:  25M with hypertension and OSA.  EXAM: Cardiac/Coronary  CT  TECHNIQUE: The patient was scanned on a [employer].  FINDINGS: A 120 kV prospective scan was triggered in the descending thoracic aorta at 111 HU's. Axial non-contrast 3 mm slices were carried out through the heart. The data set was analyzed on a dedicated work station and scored using the Agatson method. Gantry rotation speed was 250 msecs and collimation was .6 mm. No beta blockade and 0.8 mg of sl NTG was given. The 3D data set was reconstructed in 5% intervals of the 67-82 % of the R-R cycle. Diastolic phases were analyzed on a dedicated work station using MPR, MIP and VRT modes. The patient received 80 cc of contrast.  Aorta: Normal size.  2.6 cm.  Minimal calcification.  No dissection.  Aortic Valve:  Trileaflet.  No calcifications.  Coronary Arteries:  Normal coronary origin.  Right dominance.  RCA is a large dominant artery that gives rise to PDA and PLVB. There is minimal (<25%) calcified plaque proximally and mild (25-49%) calcified plaque in the mid RCA. There is minimal (<25%) calcification of the PDA.  Left main is a large artery that gives rise to LAD and LCX arteries. There is minimal (<25%) calcified plaque.  LAD is a large vessel that has minimal (<25%) calcified plaque in the mid LAD.  LCX is a non-dominant artery that gives rise to two OM branches. There is no  plaque.  Coronary Calcium  Score:  Left main: 61.7  Left anterior descending artery: 22.8  Left circumflex artery: 7.86  Right coronary artery: 197  Total: 296  Percentile: 96th  Other findings:  Normal pulmonary vein drainage into the left atrium.  Normal let atrial appendage without a thrombus.  Normal size of the pulmonary artery.  IMPRESSION: 1. Coronary calcium  score of 296. This was 96th percentile for age-, race-, and sex-matched controls.  2. Normal coronary origin with right dominance.  3. Minimal plaque in LM and LAD. Mild (25-49%) RCA plaque. CAD-RADS 2.  4. Recommend aggressive risk factor modification, including LDL goal <70.  Annabella Scarce, MD   Electronically Signed By: Annabella Scarce On: 03/14/2021 13:52  Narrative EXAM: OVER-READ INTERPRETATION  CT CHEST  The following report is an over-read performed by radiologist Dr. Toribio Aye of [facility] on 03/14/2021. This over-read does not include interpretation of cardiac or coronary anatomy or pathology. The coronary calcium  score/coronary CTA interpretation by the cardiologist is attached.  COMPARISON:  No priors.  FINDINGS: Mild linear scarring in the left lower lobe. Atherosclerotic calcifications in the thoracic aorta. Within the visualized portions of the thorax there are no suspicious appearing pulmonary nodules or masses, there is no acute consolidative airspace disease, no pleural effusions, no pneumothorax and no lymphadenopathy. Visualized portions of the upper abdomen are unremarkable. There are no aggressive appearing lytic or blastic lesions noted in the visualized portions of the skeleton.  IMPRESSION: 1.  Aortic Atherosclerosis (ICD10-I70.0).  Electronically Signed: By: Toribio Aye M.D. On:  03/14/2021 09:43     ______________________________________________________________________________________________      Risk  Assessment/Calculations:     No BP recorded.  {Refresh Note OR Click here to enter BP  :1}***       Physical Exam:   VS:  There were no vitals taken for this visit.   Wt Readings from Last 3 Encounters:  01/13/24 188 lb 9.6 oz (85.5 kg)  10/16/23 190 lb (86.2 kg)  10/16/23 191 lb 6.4 oz (86.8 kg)    GEN: Well nourished, well developed in no acute distress NECK: No JVD; No carotid bruits CARDIAC: RRR, no murmurs, rubs, gallops RESPIRATORY:  Clear to auscultation without rales, wheezing or rhonchi  ABDOMEN: Soft, non-tender, non-distended EXTREMITIES:  No edema; No deformity   ASSESSMENT AND PLAN: .   CAD-nonobstructive per coronary CTA. Stable with no anginal symptoms. No indication for ischemic evaluation.  He exercises regularly without any symptoms.  He has ITP therefore is not on aspirin, nor statins.  Currently he is on Nexlizet  180-10 mg daily. Heart healthy diet and regular cardiovascular exercise encouraged.    Dyslipidemia with statin intolerance- most recent LDL was well-controlled at 46.  He is statin intolerant secondary to his ITP.  Continue Nexlizet  180-10 mg daily.  He has been bothered by shoulder pains and he questions whether this could be related to his medication.  Initially we discussed taking a holiday from his Nexlizet , however I am going to refer him to our lipid specialist as this medication is cost prohibitive for him and only apparently been providing him with samples however we do not have a need to provide today.  Possibly be a candidate for PCSK9 inhibitor.  Will refer to lipid clinic.  Hypertension -pressures well-controlled at 100/60, continue benazepril  20 mg daily, continue Norvasc  10 mg daily.  Idiopathic thrombocytopenia-follows [facility]elius [facility].       Dispo: Refer to Lipid Clinic. Follow up 6 months with Dr. Edwyna.   Signed, Delon JAYSON Hoover, NP

## 2024-04-20 ENCOUNTER — Telehealth: Payer: Self-pay | Admitting: Cardiology

## 2024-04-20 ENCOUNTER — Other Ambulatory Visit (HOSPITAL_BASED_OUTPATIENT_CLINIC_OR_DEPARTMENT_OTHER): Payer: Self-pay

## 2024-04-20 ENCOUNTER — Encounter: Payer: Self-pay | Admitting: Cardiology

## 2024-04-20 ENCOUNTER — Ambulatory Visit: Attending: Cardiology | Admitting: Cardiology

## 2024-04-20 ENCOUNTER — Other Ambulatory Visit (HOSPITAL_COMMUNITY): Payer: Self-pay

## 2024-04-20 VITALS — BP 104/70 | HR 101 | Ht 71.0 in | Wt 188.0 lb

## 2024-04-20 DIAGNOSIS — I2510 Atherosclerotic heart disease of native coronary artery without angina pectoris: Secondary | ICD-10-CM

## 2024-04-20 DIAGNOSIS — E782 Mixed hyperlipidemia: Secondary | ICD-10-CM

## 2024-04-20 DIAGNOSIS — I10 Essential (primary) hypertension: Secondary | ICD-10-CM

## 2024-04-20 DIAGNOSIS — G4733 Obstructive sleep apnea (adult) (pediatric): Secondary | ICD-10-CM

## 2024-04-20 DIAGNOSIS — I4891 Unspecified atrial fibrillation: Secondary | ICD-10-CM

## 2024-04-20 DIAGNOSIS — E7841 Elevated Lipoprotein(a): Secondary | ICD-10-CM

## 2024-04-20 DIAGNOSIS — Z789 Other specified health status: Secondary | ICD-10-CM

## 2024-04-20 DIAGNOSIS — D473 Essential (hemorrhagic) thrombocythemia: Secondary | ICD-10-CM

## 2024-04-20 MED ORDER — IVABRADINE HCL 7.5 MG PO TABS
ORAL_TABLET | ORAL | 0 refills | Status: DC
  Filled 2024-04-20: qty 2, 1d supply, fill #0

## 2024-04-20 MED ORDER — METFORMIN HCL ER 500 MG PO TB24: 500.0000 mg | ORAL_TABLET | Freq: Four times a day (QID) | ORAL | Status: AC

## 2024-04-20 MED ORDER — AMLODIPINE BESYLATE 5 MG PO TABS: 5.0000 mg | ORAL_TABLET | Freq: Every day | ORAL | 3 refills | Status: DC

## 2024-04-20 MED ORDER — BENAZEPRIL HCL 10 MG PO TABS
10.0000 mg | ORAL_TABLET | Freq: Every day | ORAL | 3 refills | Status: DC
  Filled 2024-04-20: qty 90, 90d supply, fill #0

## 2024-04-20 NOTE — Telephone Encounter (Signed)
  Sister is calling stating she is returning a call

## 2024-04-20 NOTE — Telephone Encounter (Signed)
 Spoke with Mike [name] per DPR who states that pt has not been taking Amlodipine  as on medication list. Medication list has been updated and Mike Hoover, NP request pt decrease Benazepril  to 10 mg daily. Mike [name] verbalized understanding and had no additional questions.

## 2024-04-20 NOTE — Patient Instructions (Signed)
 Medication Instructions:  Your physician has recommended you make the following change in your medication:   Decrease your Amlodipine  to 5 mg daily  *If you need a refill on your cardiac medications before your next appointment, please call your pharmacy*  Lab Work: Your physician recommends that you have a thyroid  panel and Lp(a) today in the office.  If you have labs (blood work) drawn today and your tests are completely normal, you will receive your results only by: MyChart Message (if you have MyChart) OR A paper copy in the mail If you have any lab test that is abnormal or we need to change your treatment, we will call you to review the results.  Testing/Procedures: Your physician has requested that you have an echocardiogram. Echocardiography is a painless test that uses sound waves to create images of your heart. It provides your doctor with information about the size and shape of your heart and how well your heart's chambers and valves are working. This procedure takes approximately one hour. There are no restrictions for this procedure. Please do NOT wear cologne, perfume, aftershave, or lotions (deodorant is allowed). Please arrive 15 minutes prior to your appointment time.  Please note: We ask at that you not bring children with you during ultrasound (echo/ vascular) testing. Due to room size and safety concerns, children are not allowed in the ultrasound rooms during exams. Our front office staff cannot provide observation of children in our lobby area while testing is being conducted. An adult accompanying a patient to their appointment will only be allowed in the ultrasound room at the discretion of the ultrasound technician under special circumstances. We apologize for any inconvenience.    Your cardiac CT will be scheduled at one of the below locations:   [facility] [address] [place], [place]   Please follow these instructions carefully (unless otherwise  directed):  An IV will be required for this test and Nitroglycerin  will be given.  Hold all erectile dysfunction medications at least 3 days (72 hrs) prior to test. (Ie viagra, cialis, sildenafil, tadalafil, etc)   On the Night Before the Test: Be sure to Drink plenty of water. Do not consume any caffeinated/decaffeinated beverages or chocolate 12 hours prior to your test. Do not take any antihistamines 12 hours prior to your test.  On the Day of the Test: Drink plenty of water until 1 hour prior to the test. Do not eat any food 1 hour prior to test. You may take your regular medications prior to the test.  Take metoprolol  (Lopressor ) two hours prior to test. You will also take 15 mg Corlanor (Ivabradine ) 2 hours prior to the CT. This is a one time dose. If you take Furosemide , please HOLD on the morning of the test.       After the Test: Drink plenty of water. After receiving IV contrast, you may experience a mild flushed feeling. This is normal. On occasion, you may experience a mild rash up to 24 hours after the test. This is not dangerous. If this occurs, you can take Benadryl  25 mg, Zyrtec, Claritin, or Allegra and increase your fluid intake. (Patients taking Tikosyn should avoid Benadryl , and may take Zyrtec, Claritin, or Allegra) If you experience trouble breathing, this can be serious. If it is severe call 911 IMMEDIATELY. If it is mild, please call our office.  We will call to schedule your test 2-4 weeks out understanding that some insurance companies will need an authorization prior to the  service being performed.   For more information and frequently asked questions, please visit our website : [email or web address]  For non-scheduling related questions, please contact the cardiac imaging nurse navigator should you have any questions/concerns: Cardiac Imaging Nurse Navigators Direct Office Dial: [phone]   For scheduling needs, including cancellations and  rescheduling, please call [place], [phone].   Follow-Up: At [facility], you and your health needs are our priority.  As part of our continuing mission to provide you with exceptional heart care, our providers are all part of one team.  This team includes your primary Cardiologist (physician) and Advanced Practice Providers or APPs (Physician Assistants and Nurse Practitioners) who all work together to provide you with the care you need, when you need it.  Your next appointment:   1 month(s)  Provider:   Jennifer Crape, MD    We recommend signing up for the patient portal called MyChart.  Sign up information is provided on this After Visit Summary.  MyChart is used to connect with patients for Virtual Visits (Telemedicine).  Patients are able to view lab/test results, encounter notes, upcoming appointments, etc.  Non-urgent messages can be sent to your provider as well.   To learn more about what you can do with MyChart, go to [email or web address].   Other Instructions Cardiac CT Angiogram A cardiac CT angiogram is a procedure to look at the heart and the area around the heart. It may be done to help find the cause of chest pains or other symptoms of heart disease. During this procedure, a substance called contrast dye is injected into a vein in the arm. The contrast highlights the blood vessels in the area to be checked. A large X-ray machine (CT scanner), then takes detailed pictures of the heart and the surrounding area. The procedure is also sometimes called a coronary CT angiogram, coronary artery scanning, or CTA. A cardiac CT angiogram allows the health care provider to see how well blood is flowing to and from the heart. The provider will be able to see if there are any problems, such as: Blockage or narrowing of the arteries in the heart. Fluid around the heart. Signs of weakness or disease in the muscles, valves, and tissues of the heart. Tell a health care  provider about: Any allergies you have. This is especially important if you have had a previous allergic reaction to medicines, contrast dye, or iodine. All medicines you are taking, including vitamins, herbs, eye drops, creams, and over-the-counter medicines. Any bleeding problems you have. Any surgeries you have had. Any medical conditions you have, including kidney problems or kidney failure. Whether you are pregnant or may be pregnant. Any anxiety disorders, chronic pain, or other conditions you have. These may increase your stress or prevent you from lying still. Any history of abnormal heart rhythms or heart procedures. What are the risks? Your provider will talk with you about risks. These may include: Bleeding. Infection. Allergic reactions to medicines or dyes. Damage to other structures or organs. Kidney damage from the contrast dye. Increased risk of cancer from radiation exposure. This risk is low. Talk with your provider about: The risks and benefits of testing. How you can receive the lowest dose of radiation. What happens before the procedure? Wear comfortable clothing and remove any jewelry, glasses, dentures, and hearing aids. Follow instructions from your provider about eating and drinking. These may include: 12 hours before the procedure Avoid caffeine. This includes tea, coffee, soda, energy drinks, and diet pills. Drink plenty  of water or other fluids that do not have caffeine in them. Being well hydrated can prevent complications. 4-6 hours before the procedure Stop eating and drinking. This will reduce the risk of nausea from the contrast dye. Ask your provider about changing or stopping your regular medicines. These include: Diabetes medicines. Medicines to treat problems with erections (erectile dysfunction). If you have kidney problems, you may need to receive IV hydration before and after the test. What happens during the procedure?  Hair on your chest may  need to be removed so that small sticky patches called electrodes can be placed on your chest. These will transmit information that helps to monitor your heart during the procedure. An IV will be inserted into one of your veins. You might be given a medicine to control your heart rate during the procedure. This will help to ensure that good images are obtained. You will be asked to lie on an exam table. This table will slide in and out of the CT machine during the procedure. Contrast dye will be injected into the IV. You might feel warm, or you may get a metallic taste in your mouth. You may be given medicines to relax or dilate the arteries in your heart. If you are allergic to contrast dyes or iodine you may be given medicine before the test to reduce the risk of an allergic reaction. The table that you are lying on will move into the CT machine tunnel for the scan. The person running the machine will give you instructions while the scans are being done. You may be asked to: Keep your arms above your head. Hold your breath for short periods. Stay very still, even if the table is moving. The procedure may vary among providers and hospitals. What can I expect after the procedure? After your procedure, it is common to have: A metallic taste in your mouth from the contrast dye. A feeling of warmth. A headache from the heart medicine. Follow these instructions at home: Take over-the-counter and prescription medicines only as told by your provider. If you are told, drink enough fluid to keep your pee pale yellow. This will help to flush the contrast dye out of your body. Most people can return to their normal activities right after the procedure. Ask your provider what activities are safe for you. It is up to you to get the results of your procedure. Ask your provider, or the department that is doing the procedure, when your results will be ready. Contact a health care provider if: You have any  symptoms of allergy to the contrast dye. These include: Shortness of breath. Rash or hives. A racing heartbeat. You notice a change in your peeing (urination). This information is not intended to replace advice given to you by your health care provider. Make sure you discuss any questions you have with your health care provider. Document Revised: 03/21/2022 Document Reviewed: 03/21/2022 Elsevier Patient Education  2024 [employer].

## 2024-04-20 NOTE — Telephone Encounter (Signed)
 Mike [name] has spoken with the sister a few times today. I just heard from Dr. Shlomo -- who is our sleep specialist, she reviewed the sleep eval from Dr. Mardee and the recommendation from his office in 2022 was to wear an oral device and return for follow up (he did not follow back up with [name]).  Dr. Shlomo recommends repeating at home Itamar while wearing oral device -- to see if the oral device is managing his OSA.   OSA contributes to AF, so it is important that this is treated.

## 2024-04-21 ENCOUNTER — Ambulatory Visit: Payer: Self-pay | Admitting: Cardiology

## 2024-04-21 ENCOUNTER — Other Ambulatory Visit (HOSPITAL_COMMUNITY): Payer: Self-pay

## 2024-04-21 ENCOUNTER — Ambulatory Visit: Attending: Cardiology

## 2024-04-21 ENCOUNTER — Telehealth: Payer: Self-pay | Admitting: Cardiology

## 2024-04-21 ENCOUNTER — Encounter: Payer: Self-pay | Admitting: Pharmacist

## 2024-04-21 ENCOUNTER — Other Ambulatory Visit (HOSPITAL_BASED_OUTPATIENT_CLINIC_OR_DEPARTMENT_OTHER): Payer: Self-pay

## 2024-04-21 DIAGNOSIS — E782 Mixed hyperlipidemia: Secondary | ICD-10-CM

## 2024-04-21 DIAGNOSIS — I2510 Atherosclerotic heart disease of native coronary artery without angina pectoris: Secondary | ICD-10-CM

## 2024-04-21 DIAGNOSIS — I10 Essential (primary) hypertension: Secondary | ICD-10-CM

## 2024-04-21 DIAGNOSIS — Z789 Other specified health status: Secondary | ICD-10-CM

## 2024-04-21 DIAGNOSIS — I4891 Unspecified atrial fibrillation: Secondary | ICD-10-CM | POA: Diagnosis not present

## 2024-04-21 LAB — ECHOCARDIOGRAM COMPLETE
Area-P 1/2: 4.39 cm2
S' Lateral: 3.5 cm

## 2024-04-21 LAB — THYROID PANEL WITH TSH
Free Thyroxine Index: 3.1 (ref 1.2–4.9)
T3 Uptake Ratio: 30 % (ref 24–39)
T4, Total: 10.4 ug/dL (ref 4.5–12.0)
TSH: 2.15 u[IU]/mL (ref 0.450–4.500)

## 2024-04-21 LAB — LIPOPROTEIN A (LPA): Lipoprotein (a): 95.9 nmol/L — ABNORMAL HIGH

## 2024-04-21 MED ORDER — BENAZEPRIL HCL 10 MG PO TABS: 10.0000 mg | ORAL_TABLET | Freq: Every day | ORAL | 3 refills | Status: DC

## 2024-04-21 MED ORDER — BENAZEPRIL HCL 10 MG PO TABS
10.0000 mg | ORAL_TABLET | Freq: Every day | ORAL | 3 refills | Status: AC
  Filled 2024-04-21 (×2): qty 90, 90d supply, fill #0

## 2024-04-21 NOTE — Addendum Note (Signed)
 Addended by: ONEITA BERLINER on: 04/21/2024 08:06 AM   Modules accepted: Orders

## 2024-04-21 NOTE — Telephone Encounter (Signed)
 Pt does not wear the oral device. Please advise how to precede.

## 2024-04-21 NOTE — Telephone Encounter (Signed)
Full voice mail.

## 2024-04-21 NOTE — Telephone Encounter (Signed)
 RX resent in

## 2024-04-21 NOTE — Telephone Encounter (Signed)
 Patient would like for his benazepril  also sent to the [facility] pharmacy to get filled. Looks like it was sent to the [place] location instead. CB # [phone]

## 2024-04-22 ENCOUNTER — Telehealth: Payer: Self-pay

## 2024-04-22 NOTE — Telephone Encounter (Signed)
 Recommendations reviewed with Mike [name] per DPR as per Delon Sexton note. Mike [name] verbalized understanding and had no additional questions.

## 2024-04-22 NOTE — Telephone Encounter (Signed)
 Left message on My Chart with lab results per [facility] note. Routed to PCP.

## 2024-04-22 NOTE — Addendum Note (Signed)
 Addended by: ONEITA BERLINER on: 04/22/2024 08:12 AM   Modules accepted: Orders

## 2024-04-28 DIAGNOSIS — E1169 Type 2 diabetes mellitus with other specified complication: Secondary | ICD-10-CM | POA: Diagnosis not present

## 2024-04-28 DIAGNOSIS — Z0000 Encounter for general adult medical examination without abnormal findings: Secondary | ICD-10-CM | POA: Diagnosis not present

## 2024-04-28 DIAGNOSIS — I4891 Unspecified atrial fibrillation: Secondary | ICD-10-CM | POA: Diagnosis not present

## 2024-04-28 DIAGNOSIS — I2510 Atherosclerotic heart disease of native coronary artery without angina pectoris: Secondary | ICD-10-CM | POA: Diagnosis not present

## 2024-04-28 DIAGNOSIS — E785 Hyperlipidemia, unspecified: Secondary | ICD-10-CM | POA: Diagnosis not present

## 2024-05-03 ENCOUNTER — Other Ambulatory Visit (HOSPITAL_BASED_OUTPATIENT_CLINIC_OR_DEPARTMENT_OTHER): Payer: Self-pay

## 2024-05-26 ENCOUNTER — Ambulatory Visit: Admitting: Cardiology

## 2024-05-26 NOTE — Progress Notes (Signed)
 " Cardiology Office Note:  .   Date:  05/27/2024  ID:  Mike [name], DOB 1970/12/16, MRN [id number] PCP: Benjamine Lauraine DASEN, NP  [facility] Providers Cardiologist:  Alani Lacivita JONELLE Crape, MD    History of Present Illness: Mike   [name] is a 53 y.o. male with a past medical history of nonobstructive CAD coronary CTA in 2022, DM2, hypertension, OSA, GERD, thrombocytopenia, dyslipidemia with statin intolerance.   04/21/2024 echo EF 60-65%, mild concentric LVH, LA mild to moderately dilated, trivial MR 03/14/2021 coronary CTA calcium  score of 296, 96 percentile, minimal plaque in the LM and LAD 03/12/2021 echo EF 55 to 60%, mild concentric LVH  He initially established care with Dr. Crape in 2022 for the management of his hypertension. He had a coronary CTA in 2022 revealing a calcium  score of 296, 96th percentile.  Evaluated by Dr. Crape on 12/10/2022, he was stable from a cardiac perspective and advised to follow-up in 6 months.  Most recently he was evaluated by myself on 10/16/2023, stable from a cardiac perspective, participated in running and exercise, some concerns with his Nexlizet  and ultimately he was referred to Pharm.D. for management of his lipids, but he decision was made to continue Nexlizet .   Most recently evaluated by myself on 04/20/2024 for routine follow-up, was found to be in atrial fibrillation which was a new diagnosis, he was completely asymptomatic, he does have idiopathic thrombocytopenia and was not willing to start DOAC.  Discussed with DOD also his primary cardiologist who recommended repeating coronary CTA for progression of coronary artery disease and then possibly starting him on Tikosyn.  We made plans for him to follow-up in 1 month with his cardiologist.  He presents today for follow-up, he is very nervous about today's visit.  He is in atrial fibrillation, his rate is controlled.  His sister typically accompanies with him to his office visits however he  felt he was only getting a blood pressure check today.  Discussed again starting him on a DOAC, he refuses.  Discussed with DOD Dr. Monetta, he suggested we refer him to EP for consideration of ablation and possibility of watchman implantation.  He asked me to also call and discussed with his sister, I was able to discuss with Mike [name]'s DPR, she will accompany him to his follow-up visit with EP however she is not inclined to any interventions regarding his atrial fibrillation including DOAC, ablation, Watchman, she does agree to accompany him at this next visit though.   ROS: Review of Systems  Neurological:  Positive for dizziness.  All other systems reviewed and are negative.    Studies Reviewed: Mike   EKG Interpretation Date/Time:  Friday May 27 2024 08:56:22 EDT Ventricular Rate:  93 PR Interval:    QRS Duration:  92 QT Interval:  370 QTC Calculation: 460 R Axis:   77  Text Interpretation: Atrial fibrillation Possible Anterior infarct , age undetermined T wave abnormality, consider inferior ischemia Abnormal ECG When compared with ECG of 20-Apr-2024 09:18, Inverted T waves have replaced nonspecific T wave abnormality in Inferior leads Confirmed by Carlin Nest [phone]) on 05/27/2024 9:00:58 AM    Cardiac Studies & Procedures   ______________________________________________________________________________________________     ECHOCARDIOGRAM  ECHOCARDIOGRAM COMPLETE 04/21/2024  Narrative ECHOCARDIOGRAM REPORT    Patient Name:   Mike [name] Date of Exam: 04/21/2024 Medical Rec #:  [id number]        Height:       71.0 in Accession #:  [id number]       Weight:       188.0 lb Date of Birth:  1971-06-18        BSA:          2.054 m Patient Age:    53 years         BP:           104/74 mmHg Patient Gender: M                HR:           77 bpm. Exam Location:  [facility]  Procedure: 2D Echo, Cardiac Doppler and Color Doppler (Both Spectral and Color Flow Doppler were  utilized during procedure).  Indications:    Coronary artery disease involving native coronary artery of native heart without angina pectoris [I25.10 (ICD-10-CM)]; Essential hypertension [I10 (ICD-10-CM)]; Mixed hyperlipidemia [E78.2 (ICD-10-CM)]; Statin intolerance [Z78.9 (ICD-10-CM)]; Atrial fibrillation, new onset (HCC) [I48.91 (ICD-10-CM)]  History:        Patient has prior history of Echocardiogram examinations, most recent 03/12/2021. CAD, Arrythmias:Atrial Fibrillation; Risk Factors:Hypertension and Dyslipidemia.  Sonographer:    Charlie Jointer RDCS Referring Phys: [phone] DELON JAYSON HOOVER   Sonographer Comments: Global longitudinal strain was attempted. IMPRESSIONS   1. Left ventricular ejection fraction, by estimation, is 60 to 65%. The left ventricle has normal function. The left ventricle has no regional wall motion abnormalities. There is mild left ventricular hypertrophy. Left ventricular diastolic parameters are indeterminate. 2. Right ventricular systolic function is normal. The right ventricular size is normal. There is normal pulmonary artery systolic pressure. 3. Left atrial size was mild to moderately dilated. 4. Right atrial size was moderately dilated. 5. The mitral valve is normal in structure. Trivial mitral valve regurgitation. No evidence of mitral stenosis. 6. The aortic valve is normal in structure. Aortic valve regurgitation is not visualized. No aortic stenosis is present. 7. The inferior vena cava is normal in size with greater than 50% respiratory variability, suggesting right atrial pressure of 3 mmHg.  FINDINGS Left Ventricle: Left ventricular ejection fraction, by estimation, is 60 to 65%. The left ventricle has normal function. The left ventricle has no regional wall motion abnormalities. The left ventricular internal cavity size was normal in size. There is mild left ventricular hypertrophy. Left ventricular diastolic parameters are  indeterminate.  Right Ventricle: The right ventricular size is normal. No increase in right ventricular wall thickness. Right ventricular systolic function is normal. There is normal pulmonary artery systolic pressure. The tricuspid regurgitant velocity is 2.32 m/s, and with an assumed right atrial pressure of 3 mmHg, the estimated right ventricular systolic pressure is 24.5 mmHg.  Left Atrium: Left atrial size was mild to moderately dilated.  Right Atrium: Right atrial size was moderately dilated.  Pericardium: There is no evidence of pericardial effusion.  Mitral Valve: The mitral valve is normal in structure. Trivial mitral valve regurgitation. No evidence of mitral valve stenosis.  Tricuspid Valve: The tricuspid valve is normal in structure. Tricuspid valve regurgitation is trivial. No evidence of tricuspid stenosis.  Aortic Valve: The aortic valve is normal in structure. Aortic valve regurgitation is not visualized. No aortic stenosis is present.  Pulmonic Valve: The pulmonic valve was normal in structure. Pulmonic valve regurgitation is not visualized. No evidence of pulmonic stenosis.  Aorta: The aortic root is normal in size and structure.  Venous: The inferior vena cava is normal in size with greater than 50% respiratory variability, suggesting right atrial pressure of 3 mmHg.  IAS/Shunts:  No atrial level shunt detected by color flow Doppler.   LEFT VENTRICLE PLAX 2D LVIDd:         4.90 cm   Diastology LVIDs:         3.50 cm   LV e' medial:    12.70 cm/s LV PW:         1.10 cm   LV E/e' medial:  10.0 LV IVS:        1.20 cm   LV e' lateral:   15.20 cm/s LVOT diam:     2.00 cm   LV E/e' lateral: 8.4 LV SV:         62 LV SV Index:   30 LVOT Area:     3.14 cm   RIGHT VENTRICLE             IVC RV Basal diam:  4.20 cm     IVC diam: 1.90 cm RV Mid diam:    4.00 cm RV S prime:     12.60 cm/s TAPSE (M-mode): 2.3 cm  LEFT ATRIUM             Index        RIGHT ATRIUM            Index LA diam:        4.20 cm 2.04 cm/m   RA Area:     24.80 cm LA Vol (A2C):   84.8 ml 41.29 ml/m  RA Volume:   85.00 ml  41.39 ml/m LA Vol (A4C):   53.9 ml 26.24 ml/m LA Biplane Vol: 68.8 ml 33.50 ml/m AORTIC VALVE LVOT Vmax:   109.40 cm/s LVOT Vmean:  74.700 cm/s LVOT VTI:    0.198 m  AORTA Ao Root diam: 3.10 cm Ao Asc diam:  2.70 cm Ao Desc diam: 2.20 cm  MITRAL VALVE                TRICUSPID VALVE MV Area (PHT): 4.39 cm     TR Peak grad:   21.5 mmHg MV Decel Time: 173 msec     TR Vmax:        232.00 cm/s MV E velocity: 127.00 cm/s SHUNTS Systemic VTI:  0.20 m Systemic Diam: 2.00 cm  Mike Fitch MD Electronically signed by Mike Fitch MD Signature Date/Time: 04/21/2024/12:53:18 PM    Final      CT SCANS  CT CORONARY MORPH W/CTA COR W/SCORE 03/14/2021  Addendum 03/14/2021  1:54 PM ADDENDUM REPORT: 03/14/2021 13:52  CLINICAL DATA:  39M with hypertension and OSA.  EXAM: Cardiac/Coronary  CT  TECHNIQUE: The patient was scanned on a [employer].  FINDINGS: A 120 kV prospective scan was triggered in the descending thoracic aorta at 111 HU's. Axial non-contrast 3 mm slices were carried out through the heart. The data set was analyzed on a dedicated work station and scored using the Agatson method. Gantry rotation speed was 250 msecs and collimation was .6 mm. No beta blockade and 0.8 mg of sl NTG was given. The 3D data set was reconstructed in 5% intervals of the 67-82 % of the R-R cycle. Diastolic phases were analyzed on a dedicated work station using MPR, MIP and VRT modes. The patient received 80 cc of contrast.  Aorta: Normal size.  2.6 cm.  Minimal calcification.  No dissection.  Aortic Valve:  Trileaflet.  No calcifications.  Coronary Arteries:  Normal coronary origin.  Right dominance.  RCA is a large dominant artery that gives rise to PDA  and PLVB. There is minimal (<25%) calcified plaque proximally and mild (25-49%)  calcified plaque in the mid RCA. There is minimal (<25%) calcification of the PDA.  Left main is a large artery that gives rise to LAD and LCX arteries. There is minimal (<25%) calcified plaque.  LAD is a large vessel that has minimal (<25%) calcified plaque in the mid LAD.  LCX is a non-dominant artery that gives rise to two OM branches. There is no plaque.  Coronary Calcium  Score:  Left main: 61.7  Left anterior descending artery: 22.8  Left circumflex artery: 7.86  Right coronary artery: 197  Total: 296  Percentile: 96th  Other findings:  Normal pulmonary vein drainage into the left atrium.  Normal let atrial appendage without a thrombus.  Normal size of the pulmonary artery.  IMPRESSION: 1. Coronary calcium  score of 296. This was 96th percentile for age-, race-, and sex-matched controls.  2. Normal coronary origin with right dominance.  3. Minimal plaque in LM and LAD. Mild (25-49%) RCA plaque. CAD-RADS 2.  4. Recommend aggressive risk factor modification, including LDL goal <70.  Annabella Scarce, MD   Electronically Signed By: Annabella Scarce On: 03/14/2021 13:52  Narrative EXAM: OVER-READ INTERPRETATION  CT CHEST  The following report is an over-read performed by radiologist Dr. Toribio [name] of [facility] on 03/14/2021. This over-read does not include interpretation of cardiac or coronary anatomy or pathology. The coronary calcium  score/coronary CTA interpretation by the cardiologist is attached.  COMPARISON:  No priors.  FINDINGS: Mild linear scarring in the left lower lobe. Atherosclerotic calcifications in the thoracic aorta. Within the visualized portions of the thorax there are no suspicious appearing pulmonary nodules or masses, there is no acute consolidative airspace disease, no pleural effusions, no pneumothorax and no lymphadenopathy. Visualized portions of the upper abdomen are unremarkable. There are  no aggressive appearing lytic or blastic lesions noted in the visualized portions of the skeleton.  IMPRESSION: 1.  Aortic Atherosclerosis (ICD10-I70.0).  Electronically Signed: By: Mike [name] M.D. On: 03/14/2021 09:43     ______________________________________________________________________________________________      Risk Assessment/Calculations:    CHA2DS2-VASc Score = 3 [CHF History: 0, HTN History: 1, Diabetes History: 1, Stroke History: 0, Vascular Disease History: 1, Age Score: 0, Gender Score: 0].  Therefore, the patient's annual risk of stroke is 3.2 %.          STOP-Bang Score:  6      Physical Exam:   VS:  BP 100/68   Pulse 93   Ht 5' 11 (1.803 m)   Wt 186 lb (84.4 kg)   SpO2 97%   BMI 25.94 kg/m    Wt Readings from Last 3 Encounters:  05/27/24 186 lb (84.4 kg)  04/20/24 188 lb (85.3 kg)  04/19/24 186 lb 9.6 oz (84.6 kg)    GEN: Well nourished, well developed in no acute distress NECK: No JVD; No carotid bruits CARDIAC: RRR, no murmurs, rubs, gallops RESPIRATORY:  Clear to auscultation without rales, wheezing or rhonchi  ABDOMEN: Soft, non-tender, non-distended EXTREMITIES:  No edema; No deformity   ASSESSMENT AND PLAN: .   New onset atrial fibrillation-his CHA2DS2-VASc score is 3, rate is controlled. He does have idiopathic thrombocytopenia, his platelets are 12 but he is understandably concerned about potentially starting a DOAC. Initial plan was to repeat ccta with plans of starting flecainide. Plan is now to refer to EP for ablation consideration, and possible watchman device. Did discuss brief course of DOAC, but  he is not amenable.   CAD-nonobstructive per coronary CTA. Stable with no anginal symptoms, however we will repeat his coronary CTA to compare for disease progression.  Dyslipidemia with statin intolerance- most recent LDL was well-controlled at 41. LPA elevated at 95.9. He is statin intolerant secondary to his ITP.  Continue Nexlizet   180-10 mg daily, previously referred him to Pharm.D. for consideration of PCSK9 inhibitor secondary to some concern that the Nexletol  is causing him shoulder pain however the ultimate decision was he will continue on next visit and he appears to be tolerating it well.    Hypertension -blood pressure well-controlled at 100/68, continue benazepril  to 10 mg daily.   Idiopathic thrombocytopenia-follows with Dr. Cornelius at the cancer [facility], on chronic steroid use.  OSA-Previously evaluated for OSA however he did not prefer to follow-up with the pulmonologist he saw,  Dr. Shlomo looked at OSA evaluation, recommends repeating at home [place] with previously recommended oral device to see if the oral device is managing his OSA.        Dispo: Refer to EP for ablation and Watchman  Signed, Delon JAYSON Hoover, NP  "

## 2024-05-27 ENCOUNTER — Ambulatory Visit: Attending: Cardiology | Admitting: Cardiology

## 2024-05-27 ENCOUNTER — Encounter: Payer: Self-pay | Admitting: Cardiology

## 2024-05-27 VITALS — BP 100/68 | HR 93 | Ht 71.0 in | Wt 186.0 lb

## 2024-05-27 DIAGNOSIS — I2510 Atherosclerotic heart disease of native coronary artery without angina pectoris: Secondary | ICD-10-CM | POA: Diagnosis not present

## 2024-05-27 DIAGNOSIS — I4891 Unspecified atrial fibrillation: Secondary | ICD-10-CM | POA: Diagnosis not present

## 2024-05-27 DIAGNOSIS — I10 Essential (primary) hypertension: Secondary | ICD-10-CM

## 2024-05-27 DIAGNOSIS — D473 Essential (hemorrhagic) thrombocythemia: Secondary | ICD-10-CM

## 2024-05-27 DIAGNOSIS — Z789 Other specified health status: Secondary | ICD-10-CM | POA: Diagnosis not present

## 2024-05-27 DIAGNOSIS — E782 Mixed hyperlipidemia: Secondary | ICD-10-CM

## 2024-05-27 NOTE — Patient Instructions (Signed)
 Medication Instructions:  Your physician recommends that you continue on your current medications as directed. Please refer to the Current Medication list given to you today.  *If you need a refill on your cardiac medications before your next appointment, please call your pharmacy*  Lab Work: NONE If you have labs (blood work) drawn today and your tests are completely normal, you will receive your results only by: MyChart Message (if you have MyChart) OR A paper copy in the mail If you have any lab test that is abnormal or we need to change your treatment, we will call you to review the results.  Testing/Procedures: NONE  Follow-Up: At [facility], you and your health needs are our priority.  As part of our continuing mission to provide you with exceptional heart care, our providers are all part of one team.  This team includes your primary Cardiologist (physician) and Advanced Practice Providers or APPs (Physician Assistants and Nurse Practitioners) who all work together to provide you with the care you need, when you need it.  Your next appointment:  See [place] 1st and then F/U here    Provider:   Jennifer Crape, MD    We recommend signing up for the patient portal called MyChart.  Sign up information is provided on this After Visit Summary.  MyChart is used to connect with patients for Virtual Visits (Telemedicine).  Patients are able to view lab/test results, encounter notes, upcoming appointments, etc.  Non-urgent messages can be sent to your provider as well.   To learn more about what you can do with MyChart, go to [email or web address].   Other Instructions

## 2024-05-30 ENCOUNTER — Institutional Professional Consult (permissible substitution): Admitting: Cardiology

## 2024-05-30 ENCOUNTER — Telehealth (HOSPITAL_COMMUNITY): Payer: Self-pay | Admitting: *Deleted

## 2024-05-30 NOTE — Telephone Encounter (Signed)
 Patient and his sister calling about his upcoming cardiac imaging study; pt verbalizes understanding of appt date/time, parking situation and where to check in, pre-test NPO status, and verified current allergies; name and call back number provided for further questions should they arise  Chantal Requena RN Navigator Cardiac Imaging [facility] [phone] office [phone] cell

## 2024-05-31 ENCOUNTER — Telehealth: Payer: Self-pay | Admitting: Cardiology

## 2024-05-31 NOTE — Telephone Encounter (Signed)
 Can we just call them and cancel the coronary CTA?  There has been a lot of back and forth, he was supposed to meet with EP, however, that got pushed back.  RRR wanted the CT to see if we could start flecainide. He came back to see RRR but he had to be out so the other DOD recommended sending to EP.   Just cancel the coronary CT for now please.   Only reschedule with RRR or one of his partners please.

## 2024-05-31 NOTE — Telephone Encounter (Signed)
 Called the patient's daughter and scheduled an appointment for the patient to see Dr. Edwyna on 06/07/24 at 8:40 am. Cardiac CT has been canceled.

## 2024-05-31 NOTE — Telephone Encounter (Signed)
 Left VM that CT has been cancelled.

## 2024-06-02 ENCOUNTER — Inpatient Hospital Stay (HOSPITAL_BASED_OUTPATIENT_CLINIC_OR_DEPARTMENT_OTHER): Admission: RE | Admit: 2024-06-02 | Source: Ambulatory Visit | Admitting: Radiology

## 2024-06-03 ENCOUNTER — Other Ambulatory Visit: Payer: Self-pay

## 2024-06-03 ENCOUNTER — Other Ambulatory Visit (HOSPITAL_COMMUNITY): Payer: Self-pay

## 2024-06-07 ENCOUNTER — Encounter: Payer: Self-pay | Admitting: Cardiology

## 2024-06-07 ENCOUNTER — Other Ambulatory Visit: Payer: Self-pay | Admitting: Cardiology

## 2024-06-07 ENCOUNTER — Telehealth: Payer: Self-pay | Admitting: Cardiology

## 2024-06-07 ENCOUNTER — Other Ambulatory Visit: Payer: Self-pay | Admitting: Hematology and Oncology

## 2024-06-07 ENCOUNTER — Ambulatory Visit: Attending: Cardiology | Admitting: Cardiology

## 2024-06-07 VITALS — BP 128/78 | HR 83 | Ht 71.0 in | Wt 184.0 lb

## 2024-06-07 DIAGNOSIS — E782 Mixed hyperlipidemia: Secondary | ICD-10-CM | POA: Diagnosis not present

## 2024-06-07 DIAGNOSIS — I10 Essential (primary) hypertension: Secondary | ICD-10-CM | POA: Diagnosis not present

## 2024-06-07 DIAGNOSIS — D696 Thrombocytopenia, unspecified: Secondary | ICD-10-CM

## 2024-06-07 DIAGNOSIS — I4891 Unspecified atrial fibrillation: Secondary | ICD-10-CM

## 2024-06-07 DIAGNOSIS — D693 Immune thrombocytopenic purpura: Secondary | ICD-10-CM

## 2024-06-07 DIAGNOSIS — I2510 Atherosclerotic heart disease of native coronary artery without angina pectoris: Secondary | ICD-10-CM

## 2024-06-07 DIAGNOSIS — G4733 Obstructive sleep apnea (adult) (pediatric): Secondary | ICD-10-CM

## 2024-06-07 NOTE — Progress Notes (Signed)
 Cardiology Office Note:    Date:  06/07/2024   ID:  Mike [name], DOB October 27, 1970, MRN [id number]  PCP:  Benjamine Lauraine DASEN, NP  Cardiologist:  Jennifer JONELLE Crape, MD   Referring MD: Benjamine Lauraine DASEN, NP    ASSESSMENT:    1. Coronary artery disease involving native coronary artery of native heart without angina pectoris   2. Primary hypertension   3. Obstructive sleep apnea hypopnea, moderate   4. Mixed dyslipidemia   5. Thrombocytopenia, unspecified    PLAN:    In order of problems listed above:  Coronary artery disease: Secondary prevention stressed with the patient.  Importance of compliance with diet medication stressed and patient verbalized standing.  He was advised to walk at least half an hour a day on a daily basis. Permanent atrial fibrillation:I discussed with the patient atrial fibrillation, disease process. Management and therapy including rate and rhythm control, anticoagulation benefits and potential risks were discussed extensively with the patient. Patient had multiple questions which were answered to patient's satisfaction.  He is not on anticoagulation because of thrombocytopenia.  I had an extensive discussion with him about Watchman device and he is agreeable to go and see the specialist. Essential hypertension: Blood pressure stable and diet was emphasized. Mixed dyslipidemia: On lipid-lowering medications followed by primary care.  Lipids were reviewed and diet emphasized. Patient will be seen in follow-up appointment in 9 months or earlier if the patient has any concerns.    Medication Adjustments/Labs and Tests Ordered: Current medicines are reviewed at length with the patient today.  Concerns regarding medicines are outlined above.  Orders Placed This Encounter  Procedures   EKG 12-Lead   No orders of the defined types were placed in this encounter.    No chief complaint on file.    History of Present Illness:    Mike [name] is a 53 y.o. male.   Patient has past medical history of coronary artery disease, essential hypertension, mixed dyslipidemia and persistent/permanent atrial fibrillation.  He has thrombocytopenia for which he is not on anticoagulation.  He denies any chest pain orthopnea or PND.  He is an active gentleman.  At the time of my evaluation, the patient is alert awake oriented and in no distress.  Past Medical History:  Diagnosis Date   Acute kidney injury    Allergic rhinitis with postnasal drip    Allergy    Anemia    CAD (coronary artery disease) 05/17/2021   Complex partial seizures (HCC) 10/15/2023   Congenital cerebral ventriculomegaly (HCC) 10/15/2023   Diuretic-induced hypokalemia 05/02/2021   Dyspnea on exertion    Folate deficiency anemia 05/02/2021   GERD (gastroesophageal reflux disease)    GERD without esophagitis    Hyperbilirubinemia 08/03/2019   Hypertension    Hyponatremia    Immune thrombocytopenic purpura (HCC)    Immune thrombocytopenic purpura (HCC)    Metabolic syndrome    Mixed dyslipidemia 05/17/2021   Morbid obesity (HCC)    Obesity (BMI 30.0-34.9) 04/18/2021   Obesity (BMI 35.0-39.9 without comorbidity) 04/18/2021   Obstructive sleep apnea hypopnea, moderate    Post-COVID syndrome    Shortness of breath    Sleep apnea    wears a mouth piece   Steroid-induced diabetes    Stress and adjustment reaction    Syncope 10/30/2022   Thrombocytopenia, unspecified     Past Surgical History:  Procedure Laterality Date   NO PAST SURGERIES      Current Medications: Current Meds  Medication Sig   acetaminophen  (TYLENOL ) 500 MG tablet Take 500 mg by mouth every 4 (four) hours as needed for moderate pain.   Ascorbic Acid (VITAMIN C) 1000 MG tablet Take 1,000 mg by mouth daily.   Bempedoic Acid -Ezetimibe  (NEXLIZET ) 180-10 MG TABS Take 1 tablet by mouth daily.   benazepril  (LOTENSIN ) 10 MG tablet Take 1 tablet (10 mg total) by mouth daily.   folic acid  (FOLVITE ) 1 MG tablet TAKE ONE  TABLET BY MOUTH ONCE DAILY   furosemide  (LASIX ) 40 MG tablet Take 1 tablet (40 mg total) by mouth daily.   metFORMIN  (GLUCOPHAGE -XR) 500 MG 24 hr tablet Take 1 tablet (500 mg total) by mouth in the morning, at noon, in the evening, and at bedtime.   metoprolol  succinate (TOPROL -XL) 100 MG 24 hr tablet Take 1 tablet (100 mg total) by mouth daily. Take with or immediately following a meal.   NON FORMULARY Currently taking creatine and protein supplement daily   omeprazole (PRILOSEC) 20 MG capsule Take 20 mg by mouth daily.   potassium chloride  SA (KLOR-CON  M) 20 MEQ tablet Take 2 tablets (40 mEq total) by mouth 3 (three) times daily.   predniSONE  (DELTASONE ) 5 MG tablet Take 1 tablet (5 mg total) by mouth daily with breakfast.   traZODone (DESYREL) 100 MG tablet Take 100 mg by mouth at bedtime as needed.   vitamin B-12 (CYANOCOBALAMIN) 1000 MCG tablet Take 1,000 mcg by mouth daily.     Allergies:   Aspirin and Statins   Social History   Socioeconomic History   Marital status: Divorced    Spouse name: Not on file   Number of children: 1   Years of education: Not on file   Highest education level: Associate degree: academic program  Occupational History   Not on file  Tobacco Use   Smoking status: Never   Smokeless tobacco: Former  Building services engineer status: Never Used  Substance and Sexual Activity   Alcohol use: Not Currently   Drug use: Not Currently   Sexual activity: Not Currently  Other Topics Concern   Not on file  Social History Narrative   Right handed    Wear readers    Diet soda daily    Social Drivers of Health   Financial Resource Strain: Medium Risk (10/22/2022)   Overall Financial Resource Strain (CARDIA)    Difficulty of Paying Living Expenses: Somewhat hard  Food Insecurity: No Food Insecurity (10/22/2022)   Hunger Vital Sign    Worried About Running Out of Food in the Last Year: Never true    Ran Out of Food in the Last Year: Never true  Transportation  Needs: No Transportation Needs (04/29/2022)   PRAPARE - Administrator, Civil Service (Medical): No    Lack of Transportation (Non-Medical): No  Physical Activity: Sufficiently Active (10/22/2022)   Exercise Vital Sign    Days of Exercise per Week: 5 days    Minutes of Exercise per Session: 60 min  Stress: Stress Concern Present (10/22/2022)   [employer] of Occupational Health - Occupational Stress Questionnaire    Feeling of Stress : To some extent  Social Connections: Moderately Integrated (10/22/2022)   Social Connection and Isolation Panel    Frequency of Communication with Friends and Family: More than three times a week    Frequency of Social Gatherings with Friends and Family: More than three times a week    Attends Religious Services: 1 to 4 times per year  Active Member of Clubs or Organizations: Yes    Attends Banker Meetings: Never    Marital Status: Divorced     Family History: The patient's family history includes Breast cancer in his maternal grandmother; Heart attack in his father, paternal grandfather, and paternal uncle; Thrombosis in his maternal aunt, maternal grandmother, and maternal uncle.  ROS:   Please see the history of present illness.    All other systems reviewed and are negative.  EKGs/Labs/Other Studies Reviewed:    The following studies were reviewed today: .SABRAEKG Interpretation Date/Time:  Tuesday June 07 2024 08:46:18 EDT Ventricular Rate:  83 PR Interval:    QRS Duration:  94 QT Interval:  368 QTC Calculation: 432 R Axis:   70  Text Interpretation: Atrial fibrillation Possible Anterior infarct (cited on or before 27-May-2024) T wave abnormality, consider inferior ischemia Abnormal ECG When compared with ECG of 27-May-2024 08:56, No significant change was found Confirmed by Edwyna Backers [phone]) on 06/07/2024 9:00:43 AM     Recent Labs: 04/19/2024: ALT 35; BUN 30; Creatinine 1.16; Hemoglobin 14.5; Platelet  Count 93; Potassium 4.1; Sodium 140 04/20/2024: TSH 2.150  Recent Lipid Panel    Component Value Date/Time   CHOL 97 04/19/2024 0838   TRIG 53 04/19/2024 0838   HDL 45 04/19/2024 0838   CHOLHDL 2.2 04/19/2024 0838   VLDL 11 04/19/2024 0838   LDLCALC 41 04/19/2024 0838    Physical Exam:    VS:  BP 128/78   Pulse 83   Ht 5' 11 (1.803 m)   Wt 184 lb (83.5 kg)   SpO2 98%   BMI 25.66 kg/m     Wt Readings from Last 3 Encounters:  06/07/24 184 lb (83.5 kg)  05/27/24 186 lb (84.4 kg)  04/20/24 188 lb (85.3 kg)     GEN: Patient is in no acute distress HEENT: Normal NECK: No JVD; No carotid bruits LYMPHATICS: No lymphadenopathy CARDIAC: Hear sounds irregular, 2/6 systolic murmur at the apex. RESPIRATORY:  Clear to auscultation without rales, wheezing or rhonchi  ABDOMEN: Soft, non-tender, non-distended MUSCULOSKELETAL:  No edema; No deformity  SKIN: Warm and dry NEUROLOGIC:  Alert and oriented x 3 PSYCHIATRIC:  Normal affect   Signed, Backers JONELLE Edwyna, MD  06/07/2024 9:02 AM    [facility]

## 2024-06-07 NOTE — Telephone Encounter (Signed)
 Pt sister would like a c/b regarding upcoming appt on 10/13. She would like to know if pt needs to keep this appt. Please Advise

## 2024-06-07 NOTE — Patient Instructions (Signed)

## 2024-06-08 ENCOUNTER — Telehealth: Payer: Self-pay | Admitting: Cardiology

## 2024-06-08 DIAGNOSIS — I10 Essential (primary) hypertension: Secondary | ICD-10-CM

## 2024-06-08 MED ORDER — FUROSEMIDE 40 MG PO TABS: 40.0000 mg | ORAL_TABLET | Freq: Every day | ORAL | 3 refills | Status: AC

## 2024-06-08 NOTE — Telephone Encounter (Signed)
*  STAT* If patient is at the pharmacy, call can be transferred to refill team.   1. Which medications need to be refilled? (please list name of each medication and dose if known)   furosemide  (LASIX ) 40 MG tablet (Expired)    2. Which pharmacy/location (including street and city if local pharmacy) is medication to be sent to[place] [facility] [place] [address]   3. Do they need a 30 day or 90 day supply? 90

## 2024-06-08 NOTE — Telephone Encounter (Signed)
 Pt's medication was sent to pt's pharmacy as requested. Confirmation received.

## 2024-06-13 ENCOUNTER — Ambulatory Visit (INDEPENDENT_AMBULATORY_CARE_PROVIDER_SITE_OTHER): Admitting: Internal Medicine

## 2024-06-13 VITALS — BP 90/60 | HR 50 | Ht 71.0 in | Wt 184.0 lb

## 2024-06-13 DIAGNOSIS — Z789 Other specified health status: Secondary | ICD-10-CM | POA: Diagnosis not present

## 2024-06-13 DIAGNOSIS — E785 Hyperlipidemia, unspecified: Secondary | ICD-10-CM

## 2024-06-13 DIAGNOSIS — D473 Essential (hemorrhagic) thrombocythemia: Secondary | ICD-10-CM

## 2024-06-13 DIAGNOSIS — I2510 Atherosclerotic heart disease of native coronary artery without angina pectoris: Secondary | ICD-10-CM

## 2024-06-13 DIAGNOSIS — E7841 Elevated Lipoprotein(a): Secondary | ICD-10-CM

## 2024-06-13 NOTE — Patient Instructions (Signed)
Medication Instructions:  No changes *If you need a refill on your cardiac medications before your next appointment, please call your pharmacy*   Lab Work: none   Testing/Procedures: none   Follow-Up: As needed   

## 2024-06-13 NOTE — Progress Notes (Signed)
 LIPID CLINIC CONSULT NOTE  Chief Complaint:  Elevated LP(a)  Primary Care Physician: Benjamine Lauraine DASEN, NP  Primary Cardiologist:  Jennifer JONELLE Crape, MD  HPI:  Mike [name] is a 53 y.o. male who is being seen today for the evaluation of elevated LP(a) at the request of Carlin Delon BROCKS, NP.  This is a pleasant 53 year old male kindly referred for evaluation management of elevated LP(a).  He has a history of coronary artery disease notable by coronary CT angiography to be age advanced in 2022 with a calcium  score of 296, 96 percentile for age and sex matched controls.  Aggressive lipid-lowering and risk factor modification was recommended.  He has previously tried statins but had significant issues with ITP, disorder that existed prior to him starting statins but apparently was significantly exacerbated with statins.  He was advised by his hematologist not to take statins.  Subsequently he was switched to Nexlizet  which she seems to be tolerating and has not been to cause any issues with his platelets.  Recent labs show total cholesterol 97, triglycerides 53, HDL 45 and LDL 41.  He also has substantially changed his diet and lifestyle.  He exercises regularly and has lost from over 300 pounds (a weight that was contributed to by steroid use for his ITP) down to his current 184 pound weight.  Blood pressure interestingly was also low today at 90/60 suggesting he might need some medication adjustments for his blood pressure medicines.  He recently had an assessment of LP(a) which is elevated 95.9 nmol/L.  He was referred for this specifically.    PMHx:  Past Medical History:  Diagnosis Date   Acute kidney injury    Allergic rhinitis with postnasal drip    Allergy    Anemia    CAD (coronary artery disease) 05/17/2021   Complex partial seizures (HCC) 10/15/2023   Congenital cerebral ventriculomegaly (HCC) 10/15/2023   Diuretic-induced hypokalemia 05/02/2021   Dyspnea on exertion    Folate  deficiency anemia 05/02/2021   GERD (gastroesophageal reflux disease)    GERD without esophagitis    Hyperbilirubinemia 08/03/2019   Hypertension    Hyponatremia    Immune thrombocytopenic purpura (HCC)    Immune thrombocytopenic purpura (HCC)    Metabolic syndrome    Mixed dyslipidemia 05/17/2021   Morbid obesity (HCC)    Obesity (BMI 30.0-34.9) 04/18/2021   Obesity (BMI 35.0-39.9 without comorbidity) 04/18/2021   Obstructive sleep apnea hypopnea, moderate    Post-COVID syndrome    Shortness of breath    Sleep apnea    wears a mouth piece   Steroid-induced diabetes    Stress and adjustment reaction    Syncope 10/30/2022   Thrombocytopenia, unspecified     Past Surgical History:  Procedure Laterality Date   NO PAST SURGERIES      FAMHx:  Family History  Problem Relation Age of Onset   Heart attack Father    Thrombosis Maternal Aunt        fatal   Thrombosis Maternal Uncle        fatal   Heart attack Paternal Uncle    Breast cancer Maternal Grandmother        80s   Thrombosis Maternal Grandmother        fatal   Heart attack Paternal Grandfather     SOCHx:   reports that he has never smoked. He has quit using smokeless tobacco. He reports that he does not currently use alcohol. He reports that he  does not currently use drugs.  ALLERGIES:  Allergies  Allergen Reactions   Aspirin Other (See Comments)   Statins Other (See Comments)    ROS: Pertinent items noted in HPI and remainder of comprehensive ROS otherwise negative.  HOME MEDS: Current Outpatient Medications on File Prior to Visit  Medication Sig Dispense Refill   acetaminophen  (TYLENOL ) 500 MG tablet Take 500 mg by mouth every 4 (four) hours as needed for moderate pain.     Ascorbic Acid (VITAMIN C) 1000 MG tablet Take 1,000 mg by mouth daily.     Bempedoic Acid -Ezetimibe  (NEXLIZET ) 180-10 MG TABS Take 1 tablet by mouth daily. 90 tablet 3   benazepril  (LOTENSIN ) 10 MG tablet Take 1 tablet (10 mg  total) by mouth daily. 90 tablet 3   folic acid  (FOLVITE ) 1 MG tablet TAKE ONE TABLET BY MOUTH ONCE DAILY 90 tablet 3   furosemide  (LASIX ) 40 MG tablet Take 1 tablet (40 mg total) by mouth daily. 90 tablet 3   metFORMIN  (GLUCOPHAGE -XR) 500 MG 24 hr tablet Take 1 tablet (500 mg total) by mouth in the morning, at noon, in the evening, and at bedtime.     metoprolol  succinate (TOPROL -XL) 100 MG 24 hr tablet Take 1 tablet (100 mg total) by mouth daily. Take with or immediately following a meal. 90 tablet 3   NON FORMULARY Currently taking creatine and protein supplement daily     omeprazole (PRILOSEC) 20 MG capsule Take 20 mg by mouth daily.     potassium chloride  SA (KLOR-CON  M) 20 MEQ tablet Take 2 tablets (40 mEq total) by mouth 3 (three) times daily. 180 tablet 5   predniSONE  (DELTASONE ) 5 MG tablet Take 1 tablet (5 mg total) by mouth daily with breakfast. 90 tablet 0   traZODone (DESYREL) 100 MG tablet Take 100 mg by mouth at bedtime as needed.     vitamin B-12 (CYANOCOBALAMIN) 1000 MCG tablet Take 1,000 mcg by mouth daily.     No current facility-administered medications on file prior to visit.    LABS/IMAGING: No results found for this or any previous visit (from the past 48 hours). No results found.  LIPID PANEL:    Component Value Date/Time   CHOL 97 04/19/2024 0838   TRIG 53 04/19/2024 0838   HDL 45 04/19/2024 0838   CHOLHDL 2.2 04/19/2024 0838   VLDL 11 04/19/2024 0838   LDLCALC 41 04/19/2024 0838    Lipoprotein (a)  Date/Time Value Ref Range Status  04/20/2024 10:09 AM 95.9 (H) <75.0 nmol/L Final    Comment:    Note:  Values greater than or equal to 75.0 nmol/L may        indicate an independent risk factor for CHD,        but must be evaluated with caution when applied        to non-Caucasian populations due to the        influence of genetic factors on Lp(a) across        ethnicities.      WEIGHTS: Wt Readings from Last 3 Encounters:  06/13/24 184 lb (83.5 kg)   06/07/24 184 lb (83.5 kg)  05/27/24 186 lb (84.4 kg)    VITALS: BP 90/60 (BP Location: Left Arm, Patient Position: Sitting, Cuff Size: Normal)   Pulse (!) 50   Ht 5' 11 (1.803 m)   Wt 184 lb (83.5 kg)   SpO2 97%   BMI 25.66 kg/m   EXAM: Deferred  EKG: Deferred  ASSESSMENT:  Dyslipidemia, goal LDL less than 55 Statin intolerant-myalgias Elevated LP(a)-95.9 nmol/L CAC score of 296, 96 percentile (2022)   PLAN: 1.   Mike [name] has a dyslipidemia but currently is at target LDL less than 55.  He seems to be tolerating the combination Nexlizet  therapy and has not had issues with thrombocytopenia.  He does have a mildly elevated LP(a) however since his LDL is at target would not qualify for PCSK9 inhibitor which is the only therapy available that can lower LP(a).  Moreover he would not qualify for clinical trials which require typically LP(a) over 200.  We should have some products hopefully coming to the market based on data that should be available in about 2 years regarding specific LP(a) lowering.  At this point we do have a clinical trial option for patients with LP(a) over 200 nmol/L who have not had prior coronary events.  In the future I am certainly happy to see patients with these high LP(a) values as we may have research options for them.  No changes to his medication regimen is recommended at this time.  Plan follow-up with us  as needed.  Mike KYM Maxcy, MD, [facility], FNLA, FACP  [place]  [facility]  Medical Director of the Advanced Lipid Disorders &  Cardiovascular Risk Reduction Clinic Diplomate of the American Board of Clinical Lipidology Attending Cardiologist  Direct Dial: [phone]  Fax: [phone]  Website:  [email or web address] Mike [name] Mike [name] 06/13/2024, 9:51 AM

## 2024-06-21 ENCOUNTER — Telehealth: Payer: Self-pay

## 2024-06-21 NOTE — Telephone Encounter (Signed)
 Mike [name]: This is a tiny appendage with a very elliptical ostium. Max 13/ AVG 11/ Depth 10.1 Technically this is too small for a Watchman device There is enough depth for a 20mm however at these measurements, the device would be overcompressed. Please have the MD's review the images.

## 2024-07-19 ENCOUNTER — Telehealth: Payer: Self-pay | Admitting: Oncology

## 2024-07-19 ENCOUNTER — Encounter: Payer: Self-pay | Admitting: Oncology

## 2024-07-19 ENCOUNTER — Inpatient Hospital Stay: Attending: Hematology and Oncology

## 2024-07-19 ENCOUNTER — Other Ambulatory Visit: Payer: Self-pay | Admitting: Oncology

## 2024-07-19 ENCOUNTER — Inpatient Hospital Stay: Admitting: Oncology

## 2024-07-19 VITALS — BP 110/65 | HR 92 | Temp 97.7°F | Resp 18 | Ht 71.0 in | Wt 178.2 lb

## 2024-07-19 DIAGNOSIS — R55 Syncope and collapse: Secondary | ICD-10-CM | POA: Insufficient documentation

## 2024-07-19 DIAGNOSIS — E119 Type 2 diabetes mellitus without complications: Secondary | ICD-10-CM | POA: Diagnosis not present

## 2024-07-19 DIAGNOSIS — R7989 Other specified abnormal findings of blood chemistry: Secondary | ICD-10-CM | POA: Diagnosis not present

## 2024-07-19 DIAGNOSIS — D693 Immune thrombocytopenic purpura: Secondary | ICD-10-CM | POA: Insufficient documentation

## 2024-07-19 DIAGNOSIS — G4730 Sleep apnea, unspecified: Secondary | ICD-10-CM | POA: Diagnosis not present

## 2024-07-19 DIAGNOSIS — E099 Drug or chemical induced diabetes mellitus without complications: Secondary | ICD-10-CM | POA: Diagnosis not present

## 2024-07-19 DIAGNOSIS — M1612 Unilateral primary osteoarthritis, left hip: Secondary | ICD-10-CM | POA: Insufficient documentation

## 2024-07-19 DIAGNOSIS — Z7984 Long term (current) use of oral hypoglycemic drugs: Secondary | ICD-10-CM | POA: Diagnosis not present

## 2024-07-19 DIAGNOSIS — Z79899 Other long term (current) drug therapy: Secondary | ICD-10-CM | POA: Insufficient documentation

## 2024-07-19 DIAGNOSIS — Z803 Family history of malignant neoplasm of breast: Secondary | ICD-10-CM | POA: Insufficient documentation

## 2024-07-19 DIAGNOSIS — F419 Anxiety disorder, unspecified: Secondary | ICD-10-CM | POA: Diagnosis not present

## 2024-07-19 DIAGNOSIS — Z7952 Long term (current) use of systemic steroids: Secondary | ICD-10-CM | POA: Insufficient documentation

## 2024-07-19 DIAGNOSIS — I4821 Permanent atrial fibrillation: Secondary | ICD-10-CM | POA: Diagnosis not present

## 2024-07-19 LAB — CMP (CANCER CENTER ONLY)
ALT: 45 U/L — ABNORMAL HIGH (ref 0–44)
AST: 36 U/L (ref 15–41)
Albumin: 5 g/dL (ref 3.5–5.0)
Alkaline Phosphatase: 60 U/L (ref 38–126)
Anion gap: 14 (ref 5–15)
BUN: 40 mg/dL — ABNORMAL HIGH (ref 6–20)
CO2: 27 mmol/L (ref 22–32)
Calcium: 9.9 mg/dL (ref 8.9–10.3)
Chloride: 99 mmol/L (ref 98–111)
Creatinine: 1.17 mg/dL (ref 0.61–1.24)
GFR, Estimated: >60 mL/min (ref >60)
Glucose, Bld: 109 mg/dL — ABNORMAL HIGH (ref 70–99)
Potassium: 4.1 mmol/L (ref 3.5–5.1)
Sodium: 139 mmol/L (ref 135–145)
Total Bilirubin: 1 mg/dL (ref 0.0–1.2)
Total Protein: 7.8 g/dL (ref 6.5–8.1)

## 2024-07-19 LAB — CBC WITH DIFFERENTIAL (CANCER CENTER ONLY)
Abs Immature Granulocytes: 0.03 K/uL (ref 0.00–0.07)
Basophils Absolute: 0.1 K/uL (ref 0.0–0.1)
Basophils Relative: 1 %
Eosinophils Absolute: 0.1 K/uL (ref 0.0–0.5)
Eosinophils Relative: 1 %
HCT: 46.2 % (ref 39.0–52.0)
Hemoglobin: 15.9 g/dL (ref 13.0–17.0)
Immature Granulocytes: 0 %
Lymphocytes Relative: 25 %
Lymphs Abs: 2.2 K/uL (ref 0.7–4.0)
MCH: 29.8 pg (ref 26.0–34.0)
MCHC: 34.4 g/dL (ref 30.0–36.0)
MCV: 86.5 fL (ref 80.0–100.0)
Monocytes Absolute: 0.5 K/uL (ref 0.1–1.0)
Monocytes Relative: 6 %
Neutro Abs: 5.8 K/uL (ref 1.7–7.7)
Neutrophils Relative %: 67 %
Platelet Count: 94 K/uL — ABNORMAL LOW (ref 150–400)
RBC: 5.34 MIL/uL (ref 4.22–5.81)
RDW: 12.1 % (ref 11.5–15.5)
WBC Count: 8.6 K/uL (ref 4.0–10.5)
nRBC: 0 % (ref 0.0–0.2)

## 2024-07-19 NOTE — Progress Notes (Signed)
 [facility]  [address] [place],  [place]  [address] [phone]  Clinic Day:07/19/24  Referring physician: Benjamine Lauraine DASEN, NP  CHIEF COMPLAINT:  CC: Chronic ITP  Current Treatment: Prednisone  5 mg daily  HISTORY OF PRESENT ILLNESS:  Mike [name] is a 53 y.o. male with a history of severe thrombocytopenia consistent with immune thrombocytopenic purpura diagnosed in May 2021.  Further laboratory evaluation did not reveal any other specific etiology for his thrombocytopenia.  He had mildly elevated liver transaminases.  Abdominal ultrasound revealed increased echogenicity of hepatic parenchyma suggesting hepatic steatosis, with probable fatty sparing adjacent to gallbladder fossa.  No other abnormality was seen.  We recommended bone marrow examination, but the patient and his mother declined.  The platelet count continued to fluctuate up and down, but as his platelet count remained 25,000 in June 2021, he was started on prednisone  20 mg 3 times daily, with a good response.  He has had leukocytosis secondary to the high-dose prednisone .  He also has had oral candidiasis on occasion. Elbert Shine titers were elevated, so this may be representative of chronic fatigue syndrome. We were unable to taper him off the prednisone , so he was treated with rituximab  weekly for 4 weeks in October and November 2021.  We slowly tapered the prednisone  after that.  He had complications of significant weight gain, hypertension, steroid induced diabetes and steroid induced myopathy. He has also had hypokalemia resolved with oral supplementation.     On May 18th, 2022 his platelet count was 151,000, so prednisone  was decreased to 5 mg daily. He was admitted with COVID-19 on May 24th after leaving work earlier due to altered mental status and generalized weakness. He had acute kidney injury with a creatinine of 2.2, which normalized with IV hydration. He had fever, but no respiratory symptoms. He was  discharged on a prolonged prednisone  taper over 15 weeks.  He had been having some chest pain and dyspnea since discontinuing indapamide, so was referred to Dr. Edwyna in Cardiology. He placed him on furosemide  40 mg daily and metoprolol  50 mg daily.  He had had problems with hypokalemia for a long time, but this worsened with the diuretic, so potassium was increased to 40 mg twice daily.  The lower extremity edema has resolved with the furosemide .  ECHO revealed left ventricular hypertrophy with an EF between 55-60%, and the CT of the coronary arteries looks good.  He was placed on gabapentin 300 mg BID for neuropathy.  He was on a extremely slow taper of the prednisone , but his platelets dropped to 73,000 when the dose was decreased to 5 mg three times per wee, so we resumed 5 mg daily.  Gabapentin was discontinued as it wasn't effective.  MRI lumbar spine in October 2022 revealed degenerative changes of the lumbar spine most prominent L4-5.  He was recently referred to spinal specialist.  He was hesitant to undergo epidural injection due to thrombocytopenia, although his platelets were usually above 75,000.  He has remained on prednisone  5 mg daily and his platelet count has remained above 75,000 since October 2022.  We began to space out his visits over time.  He has had mild elevation of the bilirubin, felt to be most likely Gilbert's syndrome.  Testing was not done. He has been able to lose weight over the years.  He has struggled with chronic anxiety and was previously seen by psychiatry.  He began seeing our licensed clinical social worker in August 2023  for counseling.   He reported presyncopal episodes so underwent MRI head in March 2024.  He initially declined contrast, but there were some abnormalities, so he underwent MRI head with contrast in April which revealed stable size and shape of the ventricular system from March with asymmetric enlargement of the left frontal horn. No contrast-enhancing  lesion is visualized to suggest an obstructive cause for ventricular configuration. There were few vessels that course along the region of the foramen of Monro. Overall, this was a nonspecific finding and may be congenital. Follow up brain MRI with a CISS sequence is recommended in 6 months to assess for stability and exclude an underlying lesion. Additionally, earlier follow up was recommended if there is an acute change in patient's symptoms (worsening headaches or new neurologic symptoms) to exclude the possibility of hydrocephalus.  We referred him to neurosurgery who subsequently referred him to neurology.  In retrospect, this may have been episodes of cardiac arrhythmias.  INTERVAL HISTORY:  Mike [name] is here today for repeat clinical assessment for his chronic ITP.  He denies abnormal bleeding or bruising. Patient states that he feels ok but complains of shortness of breath and constant back pain rating 8/10 that has persisted for the last month but this improves throughout the day. He continues Prednisone  5 mg once daily. He takes about 2 tylenol  pills daily and I recommended he might get more relief with NSAIDS such as ibuprofen. I feel this would be safe as long as his platelet counts continue to be stable, running 90,000 or above. He informed me that he was diagnosed with permanent atrial fibrillation and had an extensive discussion about a Watchman device with his cardiologist, Dr. Edwyna. I informed him that with an Watchman device he would not need to be on blood thinner and with his thrombocytopenia this would be best to have the procedure done. However, if he was to go without the procedure he would need to be on low dose blood thinner as long as his platelets remain stable. He has a WBC of 8.6, hemoglobin of 15.9, and low platelet count of 94,000 down from 93,000. His CMP is normal other than an elevated BUN of 40 and ALT of 45. I will see him back in 3 months with CBC, CMP, A1c, and lipid panel.  He denies fever, chills, night sweats, or other signs of infection. He denies gastrointestinal issues. His appetite is good and His weight has decreased 8 pounds over last 3 months.   REVIEW OF SYSTEMS:  Review of Systems  Constitutional:  Negative for appetite change, chills, diaphoresis, fatigue, fever and unexpected weight change.  HENT:  Negative.  Negative for hearing loss, lump/mass, mouth sores, nosebleeds, sore throat, tinnitus, trouble swallowing and voice change.   Eyes: Negative.  Negative for eye problems and icterus.  Respiratory:  Positive for shortness of breath. Negative for chest tightness, cough, hemoptysis and wheezing.   Cardiovascular: Negative.  Negative for chest pain, leg swelling and palpitations.  Gastrointestinal: Negative.  Negative for abdominal distention, abdominal pain, blood in stool, constipation, diarrhea, nausea, rectal pain and vomiting.  Endocrine: Negative.  Negative for hot flashes.  Genitourinary: Negative.  Negative for bladder incontinence, difficulty urinating, dyspareunia, dysuria, frequency, hematuria, nocturia, pelvic pain and penile discharge.   Musculoskeletal:  Positive for back pain (Lower back, 8/10 but improving) and gait problem (difficulty ambulating). Negative for arthralgias, flank pain, myalgias, neck pain and neck stiffness.       Aching/sharp intermittent bilateral shoulder pain  Skin:  Negative.  Negative for itching, rash and wound.  Neurological:  Positive for dizziness, extremity weakness, gait problem (difficulty ambulating) and numbness (Bilateral hands). Negative for headaches, light-headedness, seizures and speech difficulty.  Hematological: Negative.  Negative for adenopathy. Does not bruise/bleed easily.  Psychiatric/Behavioral:  Positive for sleep disturbance. Negative for confusion, decreased concentration, depression and suicidal ideas. The patient is nervous/anxious.    VITALS:  Blood pressure 110/65, pulse 92, temperature  97.7 F (36.5 C), temperature source Oral, resp. rate 18, height 5' 11 (1.803 m), weight 178 lb 3.2 oz (80.8 kg), SpO2 100%.  Wt Readings from Last 3 Encounters:  07/27/24 180 lb 12.8 oz (82 kg)  07/19/24 178 lb 3.2 oz (80.8 kg)  06/13/24 184 lb (83.5 kg)    Body mass index is 24.85 kg/m.  Performance status (ECOG): 1 - Symptomatic but completely ambulatory  PHYSICAL EXAM:  Physical Exam Vitals and nursing note reviewed.  Constitutional:      General: He is not in acute distress.    Appearance: Normal appearance. He is normal weight. He is not ill-appearing, toxic-appearing or diaphoretic.  HENT:     Head: Normocephalic and atraumatic.     Right Ear: Tympanic membrane, ear canal and external ear normal. There is no impacted cerumen.     Left Ear: Tympanic membrane, ear canal and external ear normal. There is no impacted cerumen.     Nose: Nose normal. No congestion or rhinorrhea.     Mouth/Throat:     Mouth: Mucous membranes are moist.     Pharynx: Oropharynx is clear. No oropharyngeal exudate or posterior oropharyngeal erythema.  Eyes:     General: No scleral icterus.       Right eye: No discharge.        Left eye: No discharge.     Extraocular Movements: Extraocular movements intact.     Conjunctiva/sclera: Conjunctivae normal.     Pupils: Pupils are equal, round, and reactive to light.  Neck:     Vascular: No carotid bruit.  Cardiovascular:     Rate and Rhythm: Normal rate. Rhythm irregularly irregular.     Pulses: Normal pulses.     Heart sounds: Normal heart sounds. No murmur heard.    No friction rub. No gallop.  Pulmonary:     Effort: Pulmonary effort is normal. No respiratory distress.     Breath sounds: Normal breath sounds. No stridor. No wheezing, rhonchi or rales.  Chest:     Chest wall: No tenderness.  Abdominal:     General: Bowel sounds are normal. There is no distension.     Palpations: Abdomen is soft. There is no hepatomegaly, splenomegaly or mass.      Tenderness: There is no abdominal tenderness. There is no right CVA tenderness, left CVA tenderness, guarding or rebound.     Hernia: A hernia is present.     Comments: Hernia just above the umbilicus   Musculoskeletal:        General: No swelling, tenderness, deformity or signs of injury. Normal range of motion.     Cervical back: Normal range of motion and neck supple. No rigidity or tenderness.     Right lower leg: No edema.     Left lower leg: No edema.  Lymphadenopathy:     Cervical: No cervical adenopathy.     Upper Body:     Right upper body: No supraclavicular or axillary adenopathy.     Left upper body: No supraclavicular or axillary adenopathy.  Lower Body: No right inguinal adenopathy. No left inguinal adenopathy.  Skin:    General: Skin is warm and dry.     Coloration: Skin is not jaundiced or pale.     Findings: No bruising, erythema, lesion or rash.  Neurological:     General: No focal deficit present.     Mental Status: He is alert and oriented to person, place, and time. Mental status is at baseline.     Cranial Nerves: No cranial nerve deficit.     Sensory: No sensory deficit.     Motor: No weakness.     Coordination: Coordination normal.     Gait: Gait normal.     Deep Tendon Reflexes: Reflexes normal.  Psychiatric:        Mood and Affect: Mood normal.        Behavior: Behavior normal.        Thought Content: Thought content normal.        Judgment: Judgment normal.    LABS:      Latest Ref Rng & Units 07/19/2024    8:59 AM 04/19/2024    8:38 AM 01/13/2024    8:00 AM  CBC  WBC 4.0 - 10.5 K/uL 8.6  6.2  7.0   Hemoglobin 13.0 - 17.0 g/dL 84.0  85.4  85.1   Hematocrit 39.0 - 52.0 % 46.2  42.7  42.7   Platelets 150 - 400 K/uL 94  93  110       Latest Ref Rng & Units 07/19/2024    8:59 AM 04/19/2024    8:38 AM 01/13/2024    8:00 AM  CMP  Glucose 70 - 99 mg/dL 890  895  894   BUN 6 - 20 mg/dL 40  30  37   Creatinine 0.61 - 1.24 mg/dL 8.82  8.83   8.83   Sodium 135 - 145 mmol/L 139  140  142   Potassium 3.5 - 5.1 mmol/L 4.1  4.1  3.9   Chloride 98 - 111 mmol/L 99  101  102   CO2 22 - 32 mmol/L 27  27  27    Calcium  8.9 - 10.3 mg/dL 9.9  9.5  9.9   Total Protein 6.5 - 8.1 g/dL 7.8  7.3  7.5   Total Bilirubin 0.0 - 1.2 mg/dL 1.0  1.0  0.7   Alkaline Phos 38 - 126 U/L 60  57  67   AST 15 - 41 U/L 36  29  29   ALT 0 - 44 U/L 45  35  30    Lab Results  Component Value Date   TSH 2.150 04/20/2024   T4TOTAL 10.4 04/20/2024   STUDIES:  EXAM: 10/28/2023 DUAL X-RAY ABSORPTIOMETRY (DXA) FOR BONE MINERAL DENSITY  AP Spine L1-L4 (L3) 10/28/2023 53.0 Normal 4.0 1.646 g/cm2 DualFemur Neck Right 10/28/2023 53.0 Normal 1.4 1.238 g/cm2 DualFemur Total Mean 10/28/2023 53.0 Normal 1.6 1.211 g/cm2  HISTORY:   Past Medical History:  Diagnosis Date   Acute kidney injury    Allergic rhinitis with postnasal drip    Allergy    Anemia    CAD (coronary artery disease) 05/17/2021   Complex partial seizures (HCC) 10/15/2023   Congenital cerebral ventriculomegaly (HCC) 10/15/2023   Diuretic-induced hypokalemia 05/02/2021   Dyspnea on exertion    Folate deficiency anemia 05/02/2021   GERD (gastroesophageal reflux disease)    GERD without esophagitis    Hyperbilirubinemia 08/03/2019   Hypertension    Hyponatremia    Immune  thrombocytopenic purpura (HCC)    Immune thrombocytopenic purpura (HCC)    Metabolic syndrome    Mixed dyslipidemia 05/17/2021   Morbid obesity (HCC)    Obesity (BMI 30.0-34.9) 04/18/2021   Obesity (BMI 35.0-39.9 without comorbidity) 04/18/2021   Obstructive sleep apnea hypopnea, moderate    Post-COVID syndrome    Shortness of breath    Sleep apnea    wears a mouth piece   Steroid-induced diabetes    Stress and adjustment reaction    Syncope 10/30/2022   Thrombocytopenia, unspecified     Past Surgical History:  Procedure Laterality Date   NO PAST SURGERIES      Family History  Problem Relation Age of Onset    Heart attack Father    Thrombosis Maternal Aunt        fatal   Thrombosis Maternal Uncle        fatal   Heart attack Paternal Uncle    Breast cancer Maternal Grandmother        80s   Thrombosis Maternal Grandmother        fatal   Heart attack Paternal Grandfather     Social History:  reports that he has never smoked. He has quit using smokeless tobacco. He reports that he does not currently use alcohol. He reports that he does not currently use drugs.The patient is alone today.  Allergies:  Allergies  Allergen Reactions   Aspirin Other (See Comments)   Statins Other (See Comments)    Current Medications: Current Outpatient Medications  Medication Sig Dispense Refill   acetaminophen  (TYLENOL ) 500 MG tablet Take 500 mg by mouth every 4 (four) hours as needed for moderate pain.     apixaban  (ELIQUIS ) 5 MG TABS tablet Take 1 tablet (5 mg total) by mouth 2 (two) times daily. 60 tablet 6   Ascorbic Acid (VITAMIN C) 1000 MG tablet Take 1,000 mg by mouth daily.     Bempedoic Acid -Ezetimibe  (NEXLIZET ) 180-10 MG TABS Take 1 tablet by mouth daily. 90 tablet 3   benazepril  (LOTENSIN ) 10 MG tablet Take 1 tablet (10 mg total) by mouth daily. 90 tablet 3   folic acid  (FOLVITE ) 1 MG tablet TAKE ONE TABLET BY MOUTH ONCE DAILY 90 tablet 3   furosemide  (LASIX ) 40 MG tablet Take 1 tablet (40 mg total) by mouth daily. 90 tablet 3   metFORMIN  (GLUCOPHAGE -XR) 500 MG 24 hr tablet Take 1 tablet (500 mg total) by mouth in the morning, at noon, in the evening, and at bedtime.     metoprolol  succinate (TOPROL -XL) 100 MG 24 hr tablet Take 1 tablet (100 mg total) by mouth daily. Take with or immediately following a meal. 90 tablet 3   NON FORMULARY Currently taking creatine and protein supplement daily     omeprazole (PRILOSEC) 20 MG capsule Take 20 mg by mouth daily.     potassium chloride  SA (KLOR-CON  M) 20 MEQ tablet Take 2 tablets (40 mEq total) by mouth 3 (three) times daily. 180 tablet 5   predniSONE   (DELTASONE ) 5 MG tablet Take 1 tablet (5 mg total) by mouth daily with breakfast. 90 tablet 0   traZODone (DESYREL) 100 MG tablet Take 100 mg by mouth at bedtime as needed.     vitamin B-12 (CYANOCOBALAMIN) 1000 MCG tablet Take 1,000 mcg by mouth daily.     No current facility-administered medications for this visit.   ASSESSMENT & PLAN:  Assessment: 1.  Chronic ITP, which remains stable on prednisone  5 mg daily.  2.  Steroid induced diabetes, for which he continues metformin  2,000 mg twice daily.  He was able to get off of glipizide with diet and weight management.      3.  Elevated Epstein Barr titers.  This may be indicative of chronic fatigue syndrome, and I advised that he rest as needed.   4.  Sleep apnea.  He was referred to pulmonary.   5.  Chronic anxiety, mainly related to his health, and also to his job and insurance issues.  He is currently on Cymbalta 60 mg daily.  He has seen our LCSW for counseling.  6.  Hospital admission with acute kidney injury due to dehydration associated with COVID-19, May 2022.   He was placed on an extremely slow prednisone  taper but we were unable to completely taper him off.     7.  Significant left hip and lower back pain due to osteoarthritis and degenerative disc disease.  He is following with a spine specialist.  He is undergoing physical therapy. MRI of the lumbar spine revealed degenerative changes of L4-5. His surgeon does not feel comfortable doing any invasive procedure or surgery with his persistent thrombocytopenia. He is experiencing a flare of his arthralgias for the last month.    8.  Elevated bilirubin which has persisted but remains stable. I suspect possible Gilbert's syndrome as his sister has the same trait but we will hold off on ordering [id number] as it would not change our plan of care.   9. Episodes of pre-syncope which are unexplained and could be cardiac or medication or hypoglycemia. In view of the possibility of a petit mal  seizure or CNS hemorrhage, a brain MRI was done. It was then repeated with contrast. His MRI of the head in April, 2024 revealed abnormal size and shape of the ventricles with asymmetric enlargement of the left frontal horn. We don't know if this is congenital or whether it represents hydrocephalus.  A repeat scan was suggested in 6 months, in October to see if there is any difference.  He was seen by neurosurgery, who planned repeat MRI head as recommended. He referred the patient to a neurologist, Dr Gregg to have an EEG done, which was reportedly normal.  In retrospect, I wonder if some of these symptoms can be related to cardiac arrhythmias/atrial fibrillation.   10. Long-term chronic prednisone  which puts him at risk for osteoporosis. His bone density scan was normal as of February, 2025.   Plan: He continues Prednisone  5 mg once daily. He takes about 2 Tylenol  pills daily and I recommended he might get more relief with NSAIDS such as ibuprofen. I feel this would be safe as long as his platelet counts continue to be stable, running 90,000 or above. He informed me that he was diagnosed with permanent atrial fibrillation and had an extensive discussion about a Watchman device with his cardiologist, Dr. Edwyna. I informed him that with an Watchman device he would not need to be on blood thinner and with his thrombocytopenia this would be best to have the procedure done. However, if he was to go without the procedure he would need to be on low dose blood thinner as long as his platelets remain stable. He has a WBC of 8.6, hemoglobin of 15.9, and low platelet count of 94,000 down from 93,000. His CMP is normal other than an elevated BUN of 40 and ALT of 45. I will see him back in 3 months with CBC, CMP, A1c, and lipid panel. The  patient understands the plans discussed today and is in agreement with them.  He knows to contact our office if he develops concerns prior to his next appointment.    I provided 22  minutes of face-to-face time during this encounter and > 50% was spent counseling as documented under my assessment and plan.   Wanda VEAR Cornish, MD  [facility] - A DEPT OF [facility] [address] [place] [address] Dept: [phone] Dept Fax: [phone]   No orders of the defined types were placed in this encounter.    I,Jasmine M Lassiter,acting as a scribe for Wanda VEAR Cornish, MD.,have documented all relevant documentation on the behalf of Wanda VEAR Cornish, MD,as directed by  Wanda VEAR Cornish, MD while in the presence of Wanda VEAR Cornish, MD.

## 2024-07-19 NOTE — Telephone Encounter (Signed)
 Patient has been scheduled for follow-up visit per 07/18/24 LOS.  Pt aware of scheduled appt details.

## 2024-07-20 ENCOUNTER — Inpatient Hospital Stay

## 2024-07-20 ENCOUNTER — Inpatient Hospital Stay: Admitting: Oncology

## 2024-07-26 NOTE — Progress Notes (Unsigned)
 Electrophysiology Office Note:   Date:  07/28/2024  ID:  Mike [name], DOB Jun 04, 1971, MRN [id number]  Primary Cardiologist: Jennifer JONELLE Crape, MD Electrophysiologist: None      History of Present Illness:   Mike [name] is a 53 y.o. male with h/o CAD, HTN, OSA, HLD, atrial fibrillation and thrombocytopenia who is being seen today for evaluation for Watchman device implant at the request of Dr. Crape.  Discussed the use of AI scribe software for clinical note transcription with the patient, who gave verbal consent to proceed.  History of Present Illness Mike [name] is a 53 year old male with atrial fibrillation who presents for evaluation of a Watchman device. He is accompanied by a family member, possibly his sibling. He was referred by Dr. Mona for evaluation of atrial fibrillation and consideration of a Watchman device.  He has been experiencing atrial fibrillation with symptoms including dizziness, fatigue, and palpitations described as 'fluttering' and 'flip flopping'. The onset of atrial fibrillation is uncertain, but it has been confirmed since August 2025. He is currently not on any specific medication for atrial fibrillation.  His medical history includes high blood pressure and diabetes, which are relevant to his current condition. He has experienced significant weight loss. He also has chronic immune thrombocytopenic purpura (ITP), complicating the use of blood thinners. His platelet count has fluctuated, with recent counts around 90,000, but previously dropping to 17,000.  Family history reveals heart disease, with his father having had heart surgery and a heart attack. There is a mention of hereditary heart issues, with male family members typically experiencing heart problems in their fifties.  He reports a possible allergic reaction to the gel or stickers used during an echocardiogram, which caused bumps on his skin. He has experienced this reaction multiple  times.  He has a history of alcohol use but reports abstaining for over five years. He also has sleep apnea, which may contribute to his atrial fibrillation.   Review of systems complete and found to be negative unless listed in HPI.   EP Information / Studies Reviewed:    EKG is not ordered today. EKG from 06/07/24 reviewed which showed AF      Echo 04/21/24:  1. Left ventricular ejection fraction, by estimation, is 60 to 65%. The  left ventricle has normal function. The left ventricle has no regional  wall motion abnormalities. There is mild left ventricular hypertrophy.  Left ventricular diastolic parameters  are indeterminate.   2. Right ventricular systolic function is normal. The right ventricular  size is normal. There is normal pulmonary artery systolic pressure.   3. Left atrial size was mild to moderately dilated.   4. Right atrial size was moderately dilated.   5. The mitral valve is normal in structure. Trivial mitral valve  regurgitation. No evidence of mitral stenosis.   6. The aortic valve is normal in structure. Aortic valve regurgitation is  not visualized. No aortic stenosis is present.   7. The inferior vena cava is normal in size with greater than 50%  respiratory variability, suggesting right atrial pressure of 3 mmHg.   Coronary CTA 03/14/21: IMPRESSION: 1. Coronary calcium  score of 296. This was 96th percentile for age-, race-, and sex-matched controls. 2. Normal coronary origin with right dominance. 3. Minimal plaque in LM and LAD. Mild (25-49%) RCA plaque. CAD-RADS 2. 4. Recommend aggressive risk factor modification, including LDL goal <70.  Risk Assessment/Calculations:    CHA2DS2-VASc Score = 3   This indicates  a 3.2% annual risk of stroke. The patient's score is based upon: CHF History: 0 HTN History: 1 Diabetes History: 1 Stroke History: 0 Vascular Disease History: 1 Age Score: 0 Gender Score: 0      Physical Exam:   VS:  BP 126/82    Pulse 64   Ht 5' 11 (1.803 m)   Wt 180 lb 12.8 oz (82 kg)   SpO2 97%   BMI 25.22 kg/m    Wt Readings from Last 3 Encounters:  07/27/24 180 lb 12.8 oz (82 kg)  07/19/24 178 lb 3.2 oz (80.8 kg)  06/13/24 184 lb (83.5 kg)    General: Well developed, in no acute distress.  Neck: No JVD.  Cardiac: Normal rate, irregular rhythm.  Resp: Normal work of breathing.  Ext: No edema.  Neuro: No gross focal deficits.  Psych: Normal affect.   ASSESSMENT AND PLAN:    I have seen Mike [name] in the office today who is being considered for a Watchman left atrial appendage closure device. I believe they will benefit from this procedure given their history of atrial fibrillation, CHA2DS2-VASc score of 3 and unadjusted ischemic stroke rate of 3.2% per year. Unfortunately, the patient is not felt to be a long term anticoagulation candidate secondary to thrombocytopenia and elevated bleeding risk. The patient's chart has been reviewed and I feel that they would be a candidate for short term oral anticoagulation after Watchman implant.   It is my belief that after undergoing a LAA closure procedure, Mike [name] will not need long term anticoagulation which eliminates anticoagulation side effects and major bleeding risk.   Procedural risks for the Watchman implant have been reviewed with the patient including a 0.5% risk of stroke, <1% risk of perforation and <1% risk of device embolization. Other risks include bleeding, vascular damage, tamponade, worsening renal function, and death. The patient understands these risk and wishes to proceed.     The published clinical data on the safety and effectiveness of WATCHMAN include but are not limited to the following: - Holmes DR, Jess BEARD, Sick P et al. for the PROTECT AF Investigators. Percutaneous closure of the left atrial appendage versus warfarin therapy for prevention of stroke in patients with atrial fibrillation: a randomised non-inferiority trial.  Lancet 2009; 374: 534-42. GLENWOOD Jess BEARD, Doshi SK, Jonita VEAR Satchel D et al. on behalf of the PROTECT AF Investigators. Percutaneous Left Atrial Appendage Closure for Stroke Prophylaxis in Patients With Atrial Fibrillation 2.3-Year Follow-up of the PROTECT AF (Watchman Left Atrial Appendage System for Embolic Protection in Patients With Atrial Fibrillation) Trial. Circulation 2013; 127:720-729. - Alli O, Doshi S,  Kar S, Reddy VY, Sievert H et al. Quality of Life Assessment in the Randomized PROTECT AF (Percutaneous Closure of the Left Atrial Appendage Versus Warfarin Therapy for Prevention of Stroke in Patients With Atrial Fibrillation) Trial of Patients at Risk for Stroke With Nonvalvular Atrial Fibrillation. J Am Coll Cardiol 2013; 61:1790-8. GLENWOOD Satchel DR, Archer RAMAN, Price M, Whisenant B, Sievert H, Doshi S, Huber K, Reddy V. Prospective randomized evaluation of the Watchman left atrial appendage Device in patients with atrial fibrillation versus long-term warfarin therapy; the PREVAIL trial. Journal of the [employer] of Cardiology, Vol. 4, No. 1, 2014, 1-11. - Kar S, Doshi SK, Sadhu A, Horton R, Osorio J et al. Primary outcome evaluation of a next-generation left atrial appendage closure device: results from the PINNACLE FLX trial. Circulation 2021;143(18)1754-1762.   HAS-BLED score 3 Hypertension Yes  Abnormal renal and liver function (Dialysis, transplant, Cr >2.26 mg/dL /Cirrhosis or Bilirubin >2x Normal or AST/ALT/AP >3x Normal) No  Stroke No  Bleeding Yes  Labile INR (Unstable/high INR) No  Elderly (>65) No  Drugs or alcohol (>= 8 drinks/week, anti-plt or NSAID) Yes   CHA2DS2-VASc Score = 3  The patient's score is based upon: CHF History: 0 HTN History: 1 Diabetes History: 1 Stroke History: 0 Vascular Disease History: 1 Age Score: 0 Gender Score: 0       ASSESSMENT AND PLAN: Persistent Atrial Fibrillation:  Secondary Hypercoagulable State: -Unclear at what point he went into  persistent atrial fibrillation.  ECG with sinus rhythm on 12/10/22.  He is minimally symptomatic, but given his young age we talked about elevated risk of stroke, dementia and heart failure with progression to permanent atrial fibrillation.  Patient would like to pursue a rhythm control strategy.  He is off oral anticoagulation due to fear of bleeding in the setting of his ITP and chronic thrombocytopenia.  For this reason, we had a long discussion about concomitant catheter ablation and Watchman device implant, for which he would be an excellent candidate but would necessitate short-term anticoagulation.  Patient is very hesitant to undergo invasive procedures at this time.  We also discussed antiarrhythmic drug therapy and monitoring with a loop recorder and if he has no recurrence of AF then stopping oral anticoagulation.  This too would require short-term anticoagulation.  For now, patient preferred this option as it is less invasive. -We will refer for Tikosyn loading.  He will need to start oral anticoagulation prior to hospitalization.  -I will order Eliquis  5 mg twice daily.  He has been cleared by his hematologist given that ITP has been relatively stable for some time.  Follow up with Dr. Kennyth for inpatient Tikosyn loading.    Total time of encounter: 68 minutes total time of encounter, including face-to-face patient care, coordination of care and counseling regarding high complexity medical decision making re: atrial fibrillation.  Signed, Fonda Kennyth, MD

## 2024-07-27 ENCOUNTER — Encounter: Payer: Self-pay | Admitting: Cardiology

## 2024-07-27 ENCOUNTER — Ambulatory Visit: Attending: Internal Medicine | Admitting: Cardiology

## 2024-07-27 VITALS — BP 126/82 | HR 64 | Ht 71.0 in | Wt 180.8 lb

## 2024-07-27 DIAGNOSIS — I4819 Other persistent atrial fibrillation: Secondary | ICD-10-CM

## 2024-07-27 DIAGNOSIS — D693 Immune thrombocytopenic purpura: Secondary | ICD-10-CM

## 2024-07-27 DIAGNOSIS — D6869 Other thrombophilia: Secondary | ICD-10-CM | POA: Diagnosis not present

## 2024-07-27 MED ORDER — APIXABAN 5 MG PO TABS: 5.0000 mg | ORAL_TABLET | Freq: Two times a day (BID) | ORAL | 6 refills | Status: AC

## 2024-07-27 NOTE — Patient Instructions (Addendum)
 Medication Instructions:  Please start Eliquis  5 mg one tablet twice a day as instructed. Continue all other medications as listed.  *If you need a refill on your cardiac medications before your next appointment, please call your pharmacy*  Lab Work: None ordered If you have labs (blood work) drawn today and your tests are completely normal, you will receive your results only by: MyChart Message (if you have MyChart) OR A paper copy in the mail If you have any lab test that is abnormal or we need to change your treatment, we will call you to review the results.  Testing/Procedures: None ordered  Follow-Up: At [facility], you and your health needs are our priority.  As part of our continuing mission to provide you with exceptional heart care, our providers are all part of one team.  This team includes your primary Cardiologist (physician) and Advanced Practice Providers or APPs (Physician Assistants and Nurse Practitioners) who all work together to provide you with the care you need, when you need it.  Your next appointment:   To be determined  Provider:   Fonda Kitty, MD    We recommend signing up for the patient portal called MyChart.  Sign up information is provided on this After Visit Summary.  MyChart is used to connect with patients for Virtual Visits (Telemedicine).  Patients are able to view lab/test results, encounter notes, upcoming appointments, etc.  Non-urgent messages can be sent to your provider as well.   To learn more about what you can do with MyChart, go to [email or web address].        Dofetilide (Tikosyn) Hospital Admission  Preparing for admission:  Check with drug insurance company for cost of drug to ensure affordability --- Price check the generic of Tikosyn which is Dofetilide 500 mcg twice a day.   GoodRx is an option if insurance copay is unaffordable. Patient assistance is also available.  A pharmacist will review all your  medications for potential interactions with Tikosyn. If any medication changes are needed prior to admission we will be in touch with you.  If any new medications are started AFTER your admission date is set with Glade RN [phone]). Please notify our office immediately so your medication list can be updated and reviewed by our pharmacist again.  Please ensure no missed doses of your anticoagulation (blood thinner) for 3 weeks prior to admission. If a dose is missed please notify our office immediately.  If you are on coumadin/warfarin we will require 4 weekly therapeutic INR checks prior to admission.  No Benadryl  is allowed 3 days prior to admission.   On day of admission:  [facility] office visit will be scheduled on the morning of admission for preliminary labs and EKG.  Please take your morning medications and have a normal breakfast prior to arrival to the appointment.     Tikosyn initiation requires a 3 night/4 day hospital stay with constant telemetry monitoring. You will have an EKG after each dose of Tikosyn as well as daily lab draws.  You may bring personal belongings/clothing with you to the hospital. Please leave your suitcase in the car until you arrive in admissions.  If the drug does not convert you to normal rhythm a cardioversion will be performed after the 4th dose of Tikosyn.   If you use a CPAP machine; please bring this with you to the hospital.   Time of admission is dependent on bed availability in the hospital. In some instances, you will be  sent home until bed is available. Rarely admission can be delayed to the following day if hospital census prevents available beds.  Questions please call our office at [phone]

## 2024-08-02 NOTE — Progress Notes (Signed)
 Roman Dubuc                                          MRN: [id number]   08/02/2024   The [facility]BCI Quality Team Specialist reviewed this patient medical record for the purposes of chart review for care gap closure. The following were reviewed: chart review for care gap closure-kidney health evaluation for diabetes:eGFR  and uACR.    VBCI Quality Team

## 2024-08-04 ENCOUNTER — Telehealth: Payer: Self-pay | Admitting: Pharmacist

## 2024-08-04 NOTE — Telephone Encounter (Signed)
 Medication list reviewed in anticipation of upcoming Tikosyn initiation. Patient is taking a few interacting medications.  Furosemide : may increase QTc-prolonging effects of dofetilide due to effects of K and Mag. Please monitor K and Mag closely while on dofetilide.  Metformin : may increase serum concentrations of Dofetilide. Monitor for s/sx of toxicity including QTc.  Trazodone: may increase QTc-prolonging effects of dofetilide. Monitor QTc closely. It is written as prn, if patient uses fairly frequently, would recommend finding another sleep aid.   Patient is anticoagulated on Eliquis  on the appropriate dose. Please ensure that patient has not missed any anticoagulation doses in the 3 weeks prior to Tikosyn initiation.   Patient will need to be counseled to avoid use of Benadryl  while on Tikosyn and in the 2-3 days prior to Tikosyn initiation.

## 2024-08-29 DIAGNOSIS — D529 Folate deficiency anemia, unspecified: Secondary | ICD-10-CM

## 2024-09-12 ENCOUNTER — Telehealth: Payer: Self-pay | Admitting: Pharmacist

## 2024-09-12 ENCOUNTER — Encounter: Payer: Self-pay | Admitting: Pharmacist

## 2024-09-12 NOTE — Telephone Encounter (Signed)
 Patient's sister called about his Bempedoic Acid -Ezetimibe  (NEXLIZET ) 180-10 MG TABS, stating he gets assistants with this medication and wanted to speak to [facility].

## 2024-09-13 ENCOUNTER — Other Ambulatory Visit (HOSPITAL_COMMUNITY): Payer: Self-pay

## 2024-09-13 ENCOUNTER — Telehealth: Payer: Self-pay | Admitting: Pharmacy Technician

## 2024-09-13 NOTE — Telephone Encounter (Signed)
 Patient Advocate Encounter   The patient was approved for a [employer] grant that will help cover the cost of nexlizet  Total amount awarded, 2500.00.  Effective: 08/14/24 - 08/13/25   APW:[id number] ERW:[id number] Hmnle:[id number] PI:[id number]  [employer] ID: [id number]   Pharmacy provided with approval and processing information. Patient informed via mychart

## 2024-09-14 ENCOUNTER — Other Ambulatory Visit (HOSPITAL_COMMUNITY): Payer: Self-pay

## 2024-09-30 ENCOUNTER — Other Ambulatory Visit (HOSPITAL_COMMUNITY): Payer: Self-pay

## 2024-09-30 ENCOUNTER — Telehealth: Payer: Self-pay | Admitting: Pharmacy Technician

## 2024-09-30 NOTE — Telephone Encounter (Signed)
" ° °  Pharmacy Patient Advocate Encounter   Received notification from Patient Advice Request messages that prior authorization for NEXLIZET  is required/requested.   Insurance verification completed.   The patient is insured through [facility].   Per test claim: PA required; PA submitted to above mentioned insurance via Latent Key/confirmation #/EOC [id number] Status is pending  "

## 2024-10-03 ENCOUNTER — Other Ambulatory Visit (HOSPITAL_COMMUNITY): Payer: Self-pay

## 2024-10-03 MED ORDER — NEXLIZET 180-10 MG PO TABS
1.0000 | ORAL_TABLET | Freq: Every day | ORAL | 3 refills | Status: AC
  Filled 2024-10-03: qty 90, 90d supply, fill #0

## 2024-10-03 NOTE — Addendum Note (Signed)
 Addended by: Aveen Stansel D on: 10/03/2024 04:30 PM   Modules accepted: Orders

## 2024-10-06 ENCOUNTER — Other Ambulatory Visit: Payer: Self-pay

## 2024-10-19 ENCOUNTER — Inpatient Hospital Stay: Admitting: Oncology

## 2024-10-19 ENCOUNTER — Inpatient Hospital Stay
